# Patient Record
Sex: Female | Born: 1985 | Race: White | Hispanic: No | Smoking: Former smoker
Health system: Southern US, Community
[De-identification: ages and names within clinical notes are randomized; demographics above are authoritative.]

## PROBLEM LIST (undated history)

## (undated) DIAGNOSIS — D649 Anemia, unspecified: Secondary | ICD-10-CM

## (undated) DIAGNOSIS — J45909 Unspecified asthma, uncomplicated: Secondary | ICD-10-CM

## (undated) DIAGNOSIS — N920 Excessive and frequent menstruation with regular cycle: Secondary | ICD-10-CM

## (undated) DIAGNOSIS — F419 Anxiety disorder, unspecified: Secondary | ICD-10-CM

## (undated) DIAGNOSIS — F32A Depression, unspecified: Secondary | ICD-10-CM

## (undated) DIAGNOSIS — F329 Major depressive disorder, single episode, unspecified: Secondary | ICD-10-CM

## (undated) HISTORY — DX: Major depressive disorder, single episode, unspecified: F32.9

## (undated) HISTORY — DX: Unspecified asthma, uncomplicated: J45.909

## (undated) HISTORY — DX: Depression, unspecified: F32.A

## (undated) HISTORY — DX: Anxiety disorder, unspecified: F41.9

## (undated) HISTORY — PX: TUBAL LIGATION: SHX77

## (undated) HISTORY — DX: Excessive and frequent menstruation with regular cycle: N92.0

---

## 2007-03-04 ENCOUNTER — Emergency Department: Payer: Self-pay | Admitting: Emergency Medicine

## 2007-12-26 ENCOUNTER — Observation Stay: Payer: Self-pay

## 2008-02-13 ENCOUNTER — Observation Stay: Payer: Self-pay | Admitting: Obstetrics and Gynecology

## 2008-02-17 ENCOUNTER — Inpatient Hospital Stay: Payer: Self-pay

## 2009-05-07 ENCOUNTER — Observation Stay: Payer: Self-pay

## 2009-05-12 ENCOUNTER — Observation Stay: Payer: Self-pay | Admitting: Obstetrics and Gynecology

## 2009-05-23 ENCOUNTER — Observation Stay: Payer: Self-pay

## 2009-05-28 ENCOUNTER — Inpatient Hospital Stay: Payer: Self-pay | Admitting: Obstetrics and Gynecology

## 2010-01-03 ENCOUNTER — Emergency Department: Payer: Self-pay | Admitting: Emergency Medicine

## 2011-01-12 ENCOUNTER — Encounter: Payer: Self-pay | Admitting: Obstetrics and Gynecology

## 2011-03-09 ENCOUNTER — Encounter: Payer: Self-pay | Admitting: Obstetrics and Gynecology

## 2011-05-05 ENCOUNTER — Observation Stay: Payer: Self-pay | Admitting: Obstetrics and Gynecology

## 2011-05-17 ENCOUNTER — Observation Stay: Payer: Self-pay

## 2011-05-29 ENCOUNTER — Ambulatory Visit: Payer: Self-pay | Admitting: Internal Medicine

## 2011-06-15 ENCOUNTER — Inpatient Hospital Stay: Payer: Self-pay | Admitting: Obstetrics and Gynecology

## 2011-06-18 ENCOUNTER — Ambulatory Visit: Payer: Self-pay | Admitting: Internal Medicine

## 2011-07-25 ENCOUNTER — Ambulatory Visit: Payer: Self-pay | Admitting: Internal Medicine

## 2011-08-19 ENCOUNTER — Ambulatory Visit: Payer: Self-pay | Admitting: Internal Medicine

## 2012-02-21 ENCOUNTER — Emergency Department: Payer: Self-pay

## 2012-05-27 ENCOUNTER — Emergency Department: Payer: Self-pay | Admitting: Unknown Physician Specialty

## 2012-07-24 ENCOUNTER — Emergency Department: Payer: Self-pay | Admitting: Emergency Medicine

## 2012-09-19 ENCOUNTER — Emergency Department: Payer: Self-pay | Admitting: Emergency Medicine

## 2012-09-19 LAB — COMPREHENSIVE METABOLIC PANEL
Anion Gap: 8 (ref 7–16)
BUN: 11 mg/dL (ref 7–18)
Bilirubin,Total: 0.2 mg/dL (ref 0.2–1.0)
Chloride: 105 mmol/L (ref 98–107)
Co2: 29 mmol/L (ref 21–32)
Creatinine: 0.7 mg/dL (ref 0.60–1.30)
EGFR (African American): >60
EGFR (Non-African Amer.): >60
Potassium: 3.8 mmol/L (ref 3.5–5.1)
Total Protein: 8.1 g/dL (ref 6.4–8.2)

## 2012-09-19 LAB — CBC WITH DIFFERENTIAL/PLATELET
Basophil #: 0.1 10*3/uL (ref 0.0–0.1)
Eosinophil #: 0.1 10*3/uL (ref 0.0–0.7)
Eosinophil %: 1.5 %
HCT: 30.7 % — ABNORMAL LOW (ref 35.0–47.0)
Lymphocyte #: 2 10*3/uL (ref 1.0–3.6)
MCH: 21.7 pg — ABNORMAL LOW (ref 26.0–34.0)
MCV: 69 fL — ABNORMAL LOW (ref 80–100)
Monocyte #: 0.6 x10 3/mm (ref 0.2–0.9)
Neutrophil #: 5.7 10*3/uL (ref 1.4–6.5)
Neutrophil %: 67 %
Platelet: 224 10*3/uL (ref 150–440)
RBC: 4.45 10*6/uL (ref 3.80–5.20)
RDW: 17 % — ABNORMAL HIGH (ref 11.5–14.5)

## 2012-09-20 LAB — TROPONIN I: Troponin-I: <0.02 ng/mL

## 2013-03-27 ENCOUNTER — Emergency Department: Payer: Self-pay | Admitting: Internal Medicine

## 2013-06-11 DIAGNOSIS — G4489 Other headache syndrome: Secondary | ICD-10-CM | POA: Insufficient documentation

## 2013-09-01 ENCOUNTER — Ambulatory Visit: Payer: Self-pay | Admitting: Internal Medicine

## 2013-09-02 ENCOUNTER — Ambulatory Visit: Payer: Self-pay | Admitting: Internal Medicine

## 2013-09-17 ENCOUNTER — Ambulatory Visit: Payer: Self-pay | Admitting: Internal Medicine

## 2013-10-18 ENCOUNTER — Ambulatory Visit: Payer: Self-pay | Admitting: Internal Medicine

## 2013-10-23 DIAGNOSIS — L309 Dermatitis, unspecified: Secondary | ICD-10-CM | POA: Insufficient documentation

## 2013-10-27 LAB — CANCER CENTER HEMOGLOBIN: HGB: 12.5 g/dL (ref 12.0–16.0)

## 2013-11-17 ENCOUNTER — Ambulatory Visit: Payer: Self-pay | Admitting: Internal Medicine

## 2014-05-21 DIAGNOSIS — J309 Allergic rhinitis, unspecified: Secondary | ICD-10-CM | POA: Insufficient documentation

## 2014-10-28 ENCOUNTER — Emergency Department: Payer: Self-pay | Admitting: Emergency Medicine

## 2014-10-28 LAB — CBC
HCT: 42.6 % (ref 35.0–47.0)
HGB: 13.7 g/dL (ref 12.0–16.0)
MCH: 27.6 pg (ref 26.0–34.0)
MCHC: 32.3 g/dL (ref 32.0–36.0)
MCV: 86 fL (ref 80–100)
Platelet: 240 10*3/uL (ref 150–440)
RBC: 4.98 10*6/uL (ref 3.80–5.20)
RDW: 14.2 % (ref 11.5–14.5)
WBC: 10.1 10*3/uL (ref 3.6–11.0)

## 2014-10-28 LAB — BASIC METABOLIC PANEL
Anion Gap: 8 (ref 7–16)
BUN: 10 mg/dL (ref 7–18)
CALCIUM: 8.9 mg/dL (ref 8.5–10.1)
CHLORIDE: 104 mmol/L (ref 98–107)
CO2: 28 mmol/L (ref 21–32)
CREATININE: 0.67 mg/dL (ref 0.60–1.30)
EGFR (African American): >60
EGFR (Non-African Amer.): >60
Glucose: 88 mg/dL (ref 65–99)
Osmolality: 278 (ref 275–301)
POTASSIUM: 3.4 mmol/L — AB (ref 3.5–5.1)
SODIUM: 140 mmol/L (ref 136–145)

## 2014-10-28 LAB — TROPONIN I: Troponin-I: <0.02 ng/mL

## 2014-11-25 DIAGNOSIS — E669 Obesity, unspecified: Secondary | ICD-10-CM | POA: Insufficient documentation

## 2015-05-12 ENCOUNTER — Emergency Department
Admission: EM | Admit: 2015-05-12 | Discharge: 2015-05-12 | Disposition: A | Discharge status: Home / Self Care | Payer: Medicaid Other | Attending: Emergency Medicine | Admitting: Emergency Medicine

## 2015-05-12 ENCOUNTER — Encounter: Payer: Self-pay | Admitting: Emergency Medicine

## 2015-05-12 DIAGNOSIS — Z880 Allergy status to penicillin: Secondary | ICD-10-CM | POA: Insufficient documentation

## 2015-05-12 DIAGNOSIS — T63444A Toxic effect of venom of bees, undetermined, initial encounter: Secondary | ICD-10-CM | POA: Diagnosis not present

## 2015-05-12 DIAGNOSIS — Z87891 Personal history of nicotine dependence: Secondary | ICD-10-CM | POA: Diagnosis not present

## 2015-05-12 DIAGNOSIS — X58XXXA Exposure to other specified factors, initial encounter: Secondary | ICD-10-CM | POA: Insufficient documentation

## 2015-05-12 DIAGNOSIS — Y9289 Other specified places as the place of occurrence of the external cause: Secondary | ICD-10-CM | POA: Insufficient documentation

## 2015-05-12 DIAGNOSIS — Y9389 Activity, other specified: Secondary | ICD-10-CM | POA: Diagnosis not present

## 2015-05-12 DIAGNOSIS — S60561A Insect bite (nonvenomous) of right hand, initial encounter: Secondary | ICD-10-CM | POA: Diagnosis present

## 2015-05-12 DIAGNOSIS — Y998 Other external cause status: Secondary | ICD-10-CM | POA: Insufficient documentation

## 2015-05-12 MED ORDER — DIPHENHYDRAMINE HCL 50 MG/ML IJ SOLN
INTRAMUSCULAR | Status: AC
  Administered 2015-05-12: 25 mg via INTRAVENOUS
  Filled 2015-05-12: qty 1

## 2015-05-12 MED ORDER — DEXAMETHASONE SODIUM PHOSPHATE 10 MG/ML IJ SOLN
INTRAMUSCULAR | Status: AC
  Administered 2015-05-12: 10 mg via INTRAVENOUS
  Filled 2015-05-12: qty 1

## 2015-05-12 MED ORDER — DIPHENHYDRAMINE HCL 50 MG/ML IJ SOLN
25.0000 mg | Freq: Once | INTRAMUSCULAR | Status: AC
  Administered 2015-05-12: 25 mg via INTRAVENOUS

## 2015-05-12 MED ORDER — FAMOTIDINE IN NACL 20-0.9 MG/50ML-% IV SOLN
20.0000 mg | Freq: Once | INTRAVENOUS | Status: AC
  Administered 2015-05-12: 20 mg via INTRAVENOUS

## 2015-05-12 MED ORDER — SODIUM CHLORIDE 0.9 % IV BOLUS (SEPSIS)
1000.0000 mL | Freq: Once | INTRAVENOUS | Status: AC
  Administered 2015-05-12: 1000 mL via INTRAVENOUS

## 2015-05-12 MED ORDER — RANITIDINE HCL 150 MG PO TABS: 150.0000 mg | ORAL_TABLET | Freq: Two times a day (BID) | ORAL | Status: DC

## 2015-05-12 MED ORDER — PREDNISONE 20 MG PO TABS
40.0000 mg | ORAL_TABLET | Freq: Every day | ORAL | Status: AC
Start: 2015-05-12 — End: 2016-05-11

## 2015-05-12 MED ORDER — LORATADINE 10 MG PO TABS: 10.0000 mg | ORAL_TABLET | Freq: Every day | ORAL | Status: DC

## 2015-05-12 MED ORDER — FAMOTIDINE IN NACL 20-0.9 MG/50ML-% IV SOLN
INTRAVENOUS | Status: AC
  Administered 2015-05-12: 20 mg via INTRAVENOUS
  Filled 2015-05-12: qty 50

## 2015-05-12 MED ORDER — DEXAMETHASONE SODIUM PHOSPHATE 10 MG/ML IJ SOLN
10.0000 mg | Freq: Once | INTRAMUSCULAR | Status: AC
  Administered 2015-05-12: 10 mg via INTRAVENOUS

## 2015-05-12 NOTE — ED Provider Notes (Signed)
CSN: [id number]     Arrival date & time 05/12/15  1508 History   First MD Initiated Contact with Patient 05/12/15 1546     Chief Complaint  Patient presents with  . 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 No pertinent past medical history. Past Surgical History  Procedure Laterality Date  . Cesarean section     Family History  Problem Relation Age of Onset  . Heart failure Mother    History  Substance Use Topics  . Smoking status: Former Games developermoker  . Smokeless tobacco: Not on file  . Alcohol Use: No   OB History    No data available     Review of Systems  Constitutional: Negative.   HENT: Negative.   Eyes: Negative.   Respiratory: Negative.   Cardiovascular: Negative.   Gastrointestinal: Negative.   Musculoskeletal: Negative.   Skin: Positive for rash.  Neurological: Negative.   All other systems reviewed and are negative.     Allergies  Penicillins  Home Medications   Prior to Admission medications   Medication Sig Start Date End Date Taking? Authorizing Provider  loratadine (CLARITIN) 10 MG tablet Take 1 tablet (10 mg total) by mouth daily. 05/12/15 05/11/16  Giorgia Wahler William C Syrianna Schillaci, PA-C  predniSONE (DELTASONE) 20 MG tablet Take 2 tablets (40 mg total) by mouth daily. As needed for reaction 05/12/15 05/11/16  Quantay Zaremba William C Teigan Manner, PA-C  ranitidine (ZANTAC) 150 MG tablet Take 1 tablet (150 mg total) by mouth 2 (two) times daily. 05/12/15 05/11/16   Guiseppe Flanagan William C Shivangi Lutz, PA-C   BP 132/91 mmHg  Pulse 94  Temp(Src) 98.7 F (37.1 C) (Oral)  Resp 18  Ht 5\' 4"  (1.626 m)  Wt 253 lb (114.76 kg)  BMI 43.41 kg/m2  SpO2 99% Physical Exam Female appearing stated age well-developed well-nourished no acute distress Vitals were reviewed Head ears eyes nose neck and throat examination were unremarkable pupils equal round reactive light accommodation extraocular motions intact airway is open no pharyngeal swelling tongue swelling lip swelling no difficulty controlling her secretions Cardiovascular regular rate and rhythm no murmurs rubs gallops Pulmonary lungs clear to auscultation bilaterally Neuro exam nonfocal cranial nerves II through XII grossly intact Psychologically patient is very pleasant acting appropriately . Musculoskeletal she has swelling to her right hand with difficulty making a fist due to swelling in hand that she has good cap refill good distal pulses good sensation strength throughout Skin is mildly erythematous on the palmar aspect of the right hand  ED Course  Procedures (  MDM  Patient with history of allergic reactions to bee stings was stung in the right hand by me after 2 hours in the department given IV Benadryl steroid-induced and Pepcid and ice pack applied to her hand swelling is greatly reduced she is having no difficulty with breathing no chest pain shortness of breath swelling in her throat or tongue or lips and hands swelling is almost completely gone discharging her home recommended and a histamines keeping her  hand elevated ice to the area she has epi-pens at home return here for any acute concerns or worsening symptoms Final diagnoses:  Bee sting reaction, undetermined intent, initial encounter        Kaidyn Javid Rosalyn Gess, PA-C 05/12/15 1720  Myrna Blazer, MD 05/12/15 [phone]

## 2015-05-12 NOTE — Discharge Instructions (Signed)
Angioedema °Angioedema is a sudden swelling of tissues, often of the skin. It can occur on the face or genitals or in the abdomen or other body parts. The swelling usually develops over a short period and gets better in 24 to 48 hours. It often begins during the night and is found when the person wakes up. The person may also get red, itchy patches of skin (hives). Angioedema can be dangerous if it involves swelling of the air passages.  °Depending on the cause, episodes of angioedema may only happen once, come back in unpredictable patterns, or repeat for several years and then gradually fade away.  °CAUSES  °Angioedema can be caused by an allergic reaction to various triggers. It can also result from nonallergic causes, including reactions to drugs, immune system disorders, viral infections, or an abnormal gene that is passed to you from your parents (hereditary). For some people with angioedema, the cause is unknown.  °Some things that can trigger angioedema include:  °· Foods.   °· Medicines, such as ACE inhibitors, ARBs, nonsteroidal anti-inflammatory agents, or estrogen.   °· Latex.   °· Animal saliva.   °· Insect stings.   °· Dyes used in X-rays.   °· Mild injury.   °· Dental work. °· Surgery. °· Stress.   °· Sudden changes in temperature.   °· Exercise. °SIGNS AND SYMPTOMS  °· Swelling of the skin. °· Hives. If these are present, there is also intense itching. °· Redness in the affected area.   °· Pain in the affected area. °· Swollen lips or tongue. °· Breathing problems. This may happen if the air passages swell. °· Wheezing. °If internal organs are involved, there may be:  °· Nausea.   °· Abdominal pain.   °· Vomiting.   °· Difficulty swallowing.   °· Difficulty passing urine. °DIAGNOSIS  °· Your health care provider will examine the affected area and take a medical and family history. °· Various tests may be done to help determine the cause. Tests may include: °¨ Allergy skin tests to see if the problem  is an allergic reaction.   °¨ Blood tests to check for hereditary angioedema.   °¨ Tests to check for underlying diseases that could cause the condition.   °· A review of your medicines, including over-the-counter medicines, may be done. °TREATMENT  °Treatment will depend on the cause of the angioedema. Possible treatments include:  °· Removal of anything that triggered the condition (such as stopping certain medicines).   °· Medicines to treat symptoms or prevent attacks. Medicines given may include:   °¨ Antihistamines.   °¨ Epinephrine injection.   °¨ Steroids.   °· Hospitalization may be required for severe attacks. If the air passages are affected, it can be an emergency. Tubes may need to be placed to keep the airway open. °HOME CARE INSTRUCTIONS  °· Take all medicines as directed by your health care provider. °· If you were given medicines for emergency allergy treatment, always carry them with you. °· Wear a medical bracelet as directed by your health care provider.   °· Avoid known triggers. °SEEK MEDICAL CARE IF:  °· You have repeat attacks of angioedema.   °· Your attacks are more frequent or more severe despite preventive measures.   °· You have hereditary angioedema and are considering having children. It is important to discuss with your health care provider the risks of passing the condition on to your children. °SEEK IMMEDIATE MEDICAL CARE IF:  °· You have severe swelling of the mouth, tongue, or lips. °· You have difficulty breathing.   °· You have difficulty swallowing.   °· You faint. °MAKE   SURE YOU: °· Understand these instructions. °· Will watch your condition. °· Will get help right away if you are not doing well or get worse. °Document Released: 02/12/2002 Document Revised: 04/20/2014 Document Reviewed: 07/28/2013 °[employer]® Patient Information ©2015 [employer]. This information is not intended to replace advice given to you by your health care provider. Make sure you discuss any questions  you have with your health care provider. ° °

## 2015-05-12 NOTE — ED Notes (Signed)
Pt was stung by bee today.  Has epi pen, has not used it.  Right hand was stung; swelling present.  Denies trouble breathing or swelling/itching to throat.  No respiratory distress in triage.

## 2015-05-12 NOTE — ED Notes (Signed)
Pt arrives with bee sting to right hand, pt states she is allergic to bees and did not have her epi pen, pt has no problems breathing at this time, alert and oriented in no respiratory distress

## 2015-05-12 NOTE — ED Notes (Signed)
Ice pack applied to right hand.

## 2015-07-20 ENCOUNTER — Encounter: Payer: Self-pay | Admitting: *Deleted

## 2015-07-20 ENCOUNTER — Emergency Department: Payer: Medicaid Other

## 2015-07-20 ENCOUNTER — Emergency Department
Admission: EM | Admit: 2015-07-20 | Discharge: 2015-07-20 | Disposition: A | Discharge status: Home / Self Care | Payer: Medicaid Other | Attending: Emergency Medicine | Admitting: Emergency Medicine

## 2015-07-20 DIAGNOSIS — Y92512 Supermarket, store or market as the place of occurrence of the external cause: Secondary | ICD-10-CM | POA: Diagnosis not present

## 2015-07-20 DIAGNOSIS — Y9389 Activity, other specified: Secondary | ICD-10-CM | POA: Diagnosis not present

## 2015-07-20 DIAGNOSIS — Y998 Other external cause status: Secondary | ICD-10-CM | POA: Diagnosis not present

## 2015-07-20 DIAGNOSIS — Z880 Allergy status to penicillin: Secondary | ICD-10-CM | POA: Diagnosis not present

## 2015-07-20 DIAGNOSIS — S63502A Unspecified sprain of left wrist, initial encounter: Secondary | ICD-10-CM | POA: Diagnosis not present

## 2015-07-20 DIAGNOSIS — X58XXXA Exposure to other specified factors, initial encounter: Secondary | ICD-10-CM | POA: Diagnosis not present

## 2015-07-20 DIAGNOSIS — S6992XA Unspecified injury of left wrist, hand and finger(s), initial encounter: Secondary | ICD-10-CM | POA: Diagnosis present

## 2015-07-20 DIAGNOSIS — Z87891 Personal history of nicotine dependence: Secondary | ICD-10-CM | POA: Diagnosis not present

## 2015-07-20 DIAGNOSIS — Z79899 Other long term (current) drug therapy: Secondary | ICD-10-CM | POA: Diagnosis not present

## 2015-07-20 MED ORDER — KETOROLAC TROMETHAMINE 10 MG PO TABS
10.0000 mg | ORAL_TABLET | Freq: Once | ORAL | Status: AC
  Administered 2015-07-20: 10 mg via ORAL
  Filled 2015-07-20: qty 1

## 2015-07-20 MED ORDER — KETOROLAC TROMETHAMINE 10 MG PO TABS: 10.0000 mg | ORAL_TABLET | Freq: Three times a day (TID) | ORAL | Status: DC | PRN

## 2015-07-20 MED ORDER — OXYCODONE-ACETAMINOPHEN 5-325 MG PO TABS
1.0000 | ORAL_TABLET | Freq: Once | ORAL | Status: AC
Start: 2015-07-20 — End: 2015-07-20
  Administered 2015-07-20: 1 via ORAL
  Filled 2015-07-20: qty 1

## 2015-07-20 NOTE — ED Notes (Signed)
Pt states she was picking her child up out of the buggy and felt a twist in her left wrist. C/o left wrist pain and swelling. CMS intact.

## 2015-07-20 NOTE — Discharge Instructions (Signed)
Wrist Splint A wrist splint holds your wrist in a set position so that it does not move (fixed position). It can help broken bones and sprains heal faster, with less pain. It can also help relieve pressure on the nerve that runs down the middle of your arm (median nerve) into your fingers.  HOME CARE  Wear your splint as told by your doctor. It may be worn while you sleep.  Exercise your wrist as told by your doctor. These exercises help keep muscle strength in your hand and wrist. They also help to make sure you keep motion in your fingers. GET HELP RIGHT AWAY IF:   You start to lose feeling in your hand or fingers.  Your skin or fingernails turn blue or gray, or they feel cold. MAKE SURE YOU:   Understand these instructions.  Will watch your condition.  Will get help right away if you are not doing well or get worse. Document Released: 05/22/2008 Document Revised: 02/26/2012 Document Reviewed: 03/17/2014 [facility] Patient Information 2015 [place], [place]. This information is not intended to replace advice given to you by your health care provider. Make sure you discuss any questions you have with your health care provider.

## 2015-07-20 NOTE — ED Provider Notes (Signed)
[facility] Emergency Department Provider Note  ____________________________________________  Time seen: 8:15 PM  I have reviewed the triage vital signs and the nursing notes.   HISTORY  Chief Complaint Wrist Pain     HPI Cathy Trevino is a 29 y.o. female presents with left wrist pain currently 7 out of 10 and nonradiating. Patient states pain started while she was picking up her child out of the shopping cart at [place]. Patient felt a twist in her wrists with resultant pain and swelling.    Past medical history None There are no active problems to display for this patient.   Past Surgical History  Procedure Laterality Date  . Cesarean section      Current Outpatient Rx  Name  Route  Sig  Dispense  Refill  . ketorolac (TORADOL) 10 MG tablet   Oral   Take 1 tablet (10 mg total) by mouth every 8 (eight) hours as needed.   20 tablet   0   . loratadine (CLARITIN) 10 MG tablet   Oral   Take 1 tablet (10 mg total) by mouth daily.   30 tablet   2   . predniSONE (DELTASONE) 20 MG tablet   Oral   Take 2 tablets (40 mg total) by mouth daily. As needed for reaction   10 tablet   0   . ranitidine (ZANTAC) 150 MG tablet   Oral   Take 1 tablet (150 mg total) by mouth 2 (two) times daily.   14 tablet   0     Allergies Penicillins  Family History  Problem Relation Age of Onset  . Heart failure Mother     Social History History  Substance Use Topics  . Smoking status: Former Games developer  . Smokeless tobacco: Not on file  . Alcohol Use: No    Review of Systems  Constitutional: Negative for fever. Eyes: Negative for visual changes. ENT: Negative for sore throat. Cardiovascular: Negative for chest pain. Respiratory: Negative for shortness of breath. Gastrointestinal: Negative for abdominal pain, vomiting and diarrhea. Genitourinary: Negative for dysuria. Musculoskeletal: Negative for back pain. Positive left wrist pain Skin:  Negative for rash. Neurological: Negative for headaches, focal weakness or numbness.   10-point ROS otherwise negative.  ____________________________________________   PHYSICAL EXAM:  VITAL SIGNS: ED Triage Vitals  Enc Vitals Group     BP 07/20/15 1846 132/71 mmHg     Pulse Rate 07/20/15 1846 95     Resp 07/20/15 1846 18     Temp 07/20/15 1846 98.2 F (36.8 C)     Temp Source 07/20/15 1846 Oral     SpO2 07/20/15 1846 95 %     Weight 07/20/15 1846 251 lb (113.853 kg)     Height 07/20/15 1846 5\' 4"  (1.626 m)     Head Cir --      Peak Flow --      Pain Score 07/20/15 1847 6     Pain Loc --      Pain Edu? --      Excl. in GC? --     Constitutional: Alert and oriented. Well appearing and in no distress. Eyes: Conjunctivae are normal. PERRL. Normal extraocular movements. ENT   Head: Normocephalic and atraumatic.   Nose: No congestion/rhinnorhea.   Mouth/Throat: Mucous membranes are moist.   Neck: No stridor. Cardiovascular: Normal rate, regular rhythm. Normal and symmetric distal pulses are present in all extremities. No murmurs, rubs, or gallops. Respiratory: Normal respiratory effort without tachypnea  nor retractions. Breath sounds are clear and equal bilaterally. No wheezes/rales/rhonchi. Gastrointestinal: Soft and nontender. No distention. There is no CVA tenderness. Genitourinary: deferred Musculoskeletal: Pain to palpation of the ulnar aspect of the left wrists that is nonradiating. No swelling appreciated. Neurologic:  Normal speech and language. No gross focal neurologic deficits are appreciated. Speech is normal.  Skin:  Skin is warm, dry and intact. No rash noted. Psychiatric: Mood and affect are normal. Speech and behavior are normal. Patient exhibits appropriate insight and judgment.    RADIOLOGY  Left wrist x-ray revealed:    DG Wrist Complete Left (Final result) Result time: 07/20/15 19:20:06   Final result by Rad Results In Interface  (07/20/15 19:20:06)   Narrative:   CLINICAL DATA: Twisting injury to left wrist while picking up child. Left wrist pain and swelling. Initial encounter.  EXAM: LEFT WRIST - COMPLETE 3+ VIEW  COMPARISON: None.  FINDINGS: There is no evidence of fracture or dislocation. The carpal rows are intact, and demonstrate normal alignment. The joint spaces are preserved.  No significant soft tissue abnormalities are seen.  IMPRESSION: No evidence of fracture or dislocation.   Electronically Signed By: Roanna Raider M.D. On: 07/20/2015 19:20             INITIAL IMPRESSION / ASSESSMENT AND PLAN / ED COURSE  Pertinent labs & imaging results that were available during my care of the patient were reviewed by me and considered in my medical decision making (see chart for details).  Left wrist Velcro splint applied patient received Toradol 10 mg tablets by mouth in the emergency department. His strip physical exam consistent with a left wrist sprain.  ____________________________________________   FINAL CLINICAL IMPRESSION(S) / ED DIAGNOSES  Final diagnoses:  Left wrist sprain, initial encounter      Darci Current, MD 07/20/15 2037

## 2015-08-06 ENCOUNTER — Encounter: Payer: Self-pay | Admitting: Emergency Medicine

## 2015-08-06 ENCOUNTER — Emergency Department
Admission: EM | Admit: 2015-08-06 | Discharge: 2015-08-06 | Disposition: A | Discharge status: Home / Self Care | Payer: Medicaid Other | Attending: Emergency Medicine | Admitting: Emergency Medicine

## 2015-08-06 ENCOUNTER — Emergency Department: Payer: Medicaid Other

## 2015-08-06 DIAGNOSIS — Z87891 Personal history of nicotine dependence: Secondary | ICD-10-CM | POA: Diagnosis not present

## 2015-08-06 DIAGNOSIS — Y998 Other external cause status: Secondary | ICD-10-CM | POA: Insufficient documentation

## 2015-08-06 DIAGNOSIS — Z880 Allergy status to penicillin: Secondary | ICD-10-CM | POA: Insufficient documentation

## 2015-08-06 DIAGNOSIS — Y9301 Activity, walking, marching and hiking: Secondary | ICD-10-CM | POA: Diagnosis not present

## 2015-08-06 DIAGNOSIS — S93402A Sprain of unspecified ligament of left ankle, initial encounter: Secondary | ICD-10-CM | POA: Insufficient documentation

## 2015-08-06 DIAGNOSIS — S99912A Unspecified injury of left ankle, initial encounter: Secondary | ICD-10-CM | POA: Diagnosis present

## 2015-08-06 DIAGNOSIS — Z79899 Other long term (current) drug therapy: Secondary | ICD-10-CM | POA: Insufficient documentation

## 2015-08-06 DIAGNOSIS — W1849XA Other slipping, tripping and stumbling without falling, initial encounter: Secondary | ICD-10-CM | POA: Insufficient documentation

## 2015-08-06 DIAGNOSIS — Y9289 Other specified places as the place of occurrence of the external cause: Secondary | ICD-10-CM | POA: Diagnosis not present

## 2015-08-06 MED ORDER — TRAMADOL HCL 50 MG PO TABS: 100.0000 mg | ORAL_TABLET | Freq: Once | ORAL | Status: DC

## 2015-08-06 MED ORDER — IBUPROFEN 800 MG PO TABS: 800.0000 mg | ORAL_TABLET | Freq: Three times a day (TID) | ORAL | Status: DC | PRN

## 2015-08-06 NOTE — ED Notes (Signed)
Obvious "indention" in pts left ankle is no longer present upon assessment. When pt asked if it had popped back in place, pt states "I don't know." Will follow up after xray. Xray tech called and made aware.

## 2015-08-06 NOTE — ED Provider Notes (Signed)
[facility] Emergency Department Provider Note ____________________________________________  Time seen: 1515  I have reviewed the triage vital signs and the nursing notes.  HISTORY  Chief Complaint  Dislocation  HPI Cathy Trevino is a 29 y.o. female reports to the ED with injury to her left ankle today, as she tripped walking up the stairs. She describes hearing a pop to the ankle, and reports it ED with primarily lateral ankle pain with swelling. She was under the impression she may have dislocated her ankle.   History reviewed. No pertinent past medical history.  There are no active problems to display for this patient.  Past Surgical History  Procedure Laterality Date  . Cesarean section      Current Outpatient Rx  Name  Route  Sig  Dispense  Refill  . ibuprofen (ADVIL,MOTRIN) 800 MG tablet   Oral   Take 1 tablet (800 mg total) by mouth every 8 (eight) hours as needed.   30 tablet   0   . loratadine (CLARITIN) 10 MG tablet   Oral   Take 1 tablet (10 mg total) by mouth daily.   30 tablet   2   . predniSONE (DELTASONE) 20 MG tablet   Oral   Take 2 tablets (40 mg total) by mouth daily. As needed for reaction   10 tablet   0   . ranitidine (ZANTAC) 150 MG tablet   Oral   Take 1 tablet (150 mg total) by mouth 2 (two) times daily.   14 tablet   0    Allergies Penicillins  Family History  Problem Relation Age of Onset  . Heart failure Mother     Social History Social History  Substance Use Topics  . Smoking status: Former Games developer  . Smokeless tobacco: None  . Alcohol Use: No   Review of Systems  Constitutional: Negative for fever. Eyes: Negative for visual changes. ENT: Negative for sore throat. Cardiovascular: Negative for chest pain. Respiratory: Negative for shortness of breath. Gastrointestinal: Negative for abdominal pain, vomiting and diarrhea. Genitourinary: Negative for dysuria. Musculoskeletal: Negative for back  pain. Left ankle pain Skin: Negative for rash. Neurological: Negative for headaches, focal weakness or numbness. ____________________________________________  PHYSICAL EXAM:  VITAL SIGNS: ED Triage Vitals  Enc Vitals Group     BP 08/06/15 1237 113/75 mmHg     Pulse Rate 08/06/15 1237 108     Resp 08/06/15 1237 18     Temp 08/06/15 1237 98.4 F (36.9 C)     Temp Source 08/06/15 1237 Oral     SpO2 08/06/15 1237 99 %     Weight --      Height --      Head Cir --      Peak Flow --      Pain Score 08/06/15 1311 10     Pain Loc --      Pain Edu? --      Excl. in GC? --    Constitutional: Alert and oriented. Well appearing and in no distress. Eyes: Conjunctivae are normal. PERRL. Normal extraocular movements. ENT   Head: Normocephalic and atraumatic.   Nose: No congestion/rhinnorhea.   Mouth/Throat: Mucous membranes are moist.   Neck: Supple. No thyromegaly. Hematological/Lymphatic/Immunilogical: No cervical lymphadenopathy. Cardiovascular: Normal distal pulses Respiratory: Normal respiratory effort. No wheezes/rales/rhonchi. Musculoskeletal: Nontender with normal range of motion in all extremities, except the left ankle. Obvious lateral swelling, tenderness, and early ecchymosis is noted. No joint deformity is appreciated on gross  exam. Normal ROM with negative drawer. No calf or achilles tenderness.  Neurologic:  Normal gait without ataxia. Normal speech and language. No gross focal neurologic deficits are appreciated. Skin:  Skin is warm, dry and intact. No rash noted. Psychiatric: Mood and affect are normal. Patient exhibits appropriate insight and judgment. ____________________________________________   RADIOLOGY Left Ankle  IMPRESSION: No acute abnormality. ____________________________________________  PROCEDURES  Splint Crutches ____________________________________________  INITIAL IMPRESSION / ASSESSMENT AND PLAN / ED COURSE  Grade II left ankle  sprain without dislocation. Splint and crutches for support. Ibuprofen 800 mg #30 for pain and inflammation. Follow-up with ortho for ongoing symptoms.  ____________________________________________  FINAL CLINICAL IMPRESSION(S) / ED DIAGNOSES  Final diagnoses:  Left ankle sprain, initial encounter     Lissa Hoard, PA-C 08/07/15 1939  Loleta Rose, MD 08/08/15 0002

## 2015-08-06 NOTE — ED Notes (Signed)
AAOx3.  Skin warm and dry.  NAD.  D/C home 

## 2015-08-06 NOTE — Discharge Instructions (Signed)
Ankle Sprain °An ankle sprain is an injury to the strong, fibrous tissues (ligaments) that hold the bones of your ankle joint together.  °CAUSES °An ankle sprain is usually caused by a fall or by twisting your ankle. Ankle sprains most commonly occur when you step on the outer edge of your foot, and your ankle turns inward. People who participate in sports are more prone to these types of injuries.  °SYMPTOMS  °· Pain in your ankle. The pain may be present at rest or only when you are trying to stand or walk. °· Swelling. °· Bruising. Bruising may develop immediately or within 1 to 2 days after your injury. °· Difficulty standing or walking, particularly when turning corners or changing directions. °DIAGNOSIS  °Your caregiver will ask you details about your injury and perform a physical exam of your ankle to determine if you have an ankle sprain. During the physical exam, your caregiver will press on and apply pressure to specific areas of your foot and ankle. Your caregiver will try to move your ankle in certain ways. An X-ray exam may be done to be sure a bone was not broken or a ligament did not separate from one of the bones in your ankle (avulsion fracture).  °TREATMENT  °Certain types of braces can help stabilize your ankle. Your caregiver can make a recommendation for this. Your caregiver may recommend the use of medicine for pain. If your sprain is severe, your caregiver may refer you to a surgeon who helps to restore function to parts of your skeletal system (orthopedist) or a physical therapist. °HOME CARE INSTRUCTIONS  °· Apply ice to your injury for 1-2 days or as directed by your caregiver. Applying ice helps to reduce inflammation and pain. °¨ Put ice in a plastic bag. °¨ Place a towel between your skin and the bag. °¨ Leave the ice on for 15-20 minutes at a time, every 2 hours while you are awake. °· Only take over-the-counter or prescription medicines for pain, discomfort, or fever as directed by  your caregiver. °· Elevate your injured ankle above the level of your heart as much as possible for 2-3 days. °· If your caregiver recommends crutches, use them as instructed. Gradually put weight on the affected ankle. Continue to use crutches or a cane until you can walk without feeling pain in your ankle. °· If you have a plaster splint, wear the splint as directed by your caregiver. Do not rest it on anything harder than a pillow for the first 24 hours. Do not put weight on it. Do not get it wet. You may take it off to take a shower or bath. °· You may have been given an elastic bandage to wear around your ankle to provide support. If the elastic bandage is too tight (you have numbness or tingling in your foot or your foot becomes cold and blue), adjust the bandage to make it comfortable. °· If you have an air splint, you may blow more air into it or let air out to make it more comfortable. You may take your splint off at night and before taking a shower or bath. Wiggle your toes in the splint several times per day to decrease swelling. °SEEK MEDICAL CARE IF:  °· You have rapidly increasing bruising or swelling. °· Your toes feel extremely cold or you lose feeling in your foot. °· Your pain is not relieved with medicine. °SEEK IMMEDIATE MEDICAL CARE IF: °· Your toes are numb or blue. °·   You have severe pain that is increasing. MAKE SURE YOU:   Understand these instructions.  Will watch your condition.  Will get help right away if you are not doing well or get worse. Document Released: 12/04/2005 Document Revised: 08/28/2012 Document Reviewed: 12/16/2011 [facility] Patient Information 2015 [place], [place]. This information is not intended to replace advice given to you by your health care provider. Make sure you discuss any questions you have with your health care provider.  Rest with the leg elevated.  Take the prescription ibuprofen as needed. Apply ice and use the crutches as needed. Follow-up with  your provider or Dr. Joice Lofts as needed.

## 2015-08-06 NOTE — ED Notes (Signed)
AAOx3.  Skin warm and dry.  NAD 

## 2015-08-06 NOTE — ED Notes (Signed)
Patient transported to X-ray 

## 2015-08-06 NOTE — ED Notes (Signed)
Pt states she tripped up the stairs today and injured her left ankle today, obvious dislocation or deformity to ankle, swelling noted. Pt states she heard a "pop."

## 2015-12-21 DIAGNOSIS — R079 Chest pain, unspecified: Secondary | ICD-10-CM | POA: Insufficient documentation

## 2016-02-29 ENCOUNTER — Ambulatory Visit: Payer: Medicaid Other | Admitting: Internal Medicine

## 2016-03-07 ENCOUNTER — Ambulatory Visit: Payer: Medicaid Other | Admitting: Internal Medicine

## 2016-03-10 ENCOUNTER — Other Ambulatory Visit: Payer: Self-pay | Admitting: Nurse Practitioner

## 2016-03-10 ENCOUNTER — Ambulatory Visit
Admission: RE | Admit: 2016-03-10 | Discharge: 2016-03-10 | Disposition: A | Discharge status: Home / Self Care | Payer: Medicaid Other | Source: Ambulatory Visit | Attending: Nurse Practitioner | Admitting: Nurse Practitioner

## 2016-03-10 DIAGNOSIS — M79602 Pain in left arm: Secondary | ICD-10-CM | POA: Diagnosis not present

## 2016-03-10 DIAGNOSIS — R238 Other skin changes: Secondary | ICD-10-CM | POA: Diagnosis present

## 2016-03-14 ENCOUNTER — Ambulatory Visit: Payer: Medicaid Other | Admitting: Internal Medicine

## 2016-03-15 ENCOUNTER — Emergency Department
Admission: EM | Admit: 2016-03-15 | Discharge: 2016-03-15 | Disposition: A | Discharge status: Home / Self Care | Payer: Medicaid Other | Attending: Emergency Medicine | Admitting: Emergency Medicine

## 2016-03-15 ENCOUNTER — Encounter: Payer: Self-pay | Admitting: Emergency Medicine

## 2016-03-15 DIAGNOSIS — Z791 Long term (current) use of non-steroidal anti-inflammatories (NSAID): Secondary | ICD-10-CM | POA: Diagnosis not present

## 2016-03-15 DIAGNOSIS — R109 Unspecified abdominal pain: Secondary | ICD-10-CM | POA: Diagnosis present

## 2016-03-15 DIAGNOSIS — Z79899 Other long term (current) drug therapy: Secondary | ICD-10-CM | POA: Diagnosis not present

## 2016-03-15 DIAGNOSIS — B029 Zoster without complications: Secondary | ICD-10-CM | POA: Insufficient documentation

## 2016-03-15 DIAGNOSIS — Z87891 Personal history of nicotine dependence: Secondary | ICD-10-CM | POA: Diagnosis not present

## 2016-03-15 DIAGNOSIS — Z7952 Long term (current) use of systemic steroids: Secondary | ICD-10-CM | POA: Diagnosis not present

## 2016-03-15 HISTORY — DX: Anemia, unspecified: D64.9

## 2016-03-15 LAB — URINALYSIS COMPLETE WITH MICROSCOPIC (ARMC ONLY)
Bacteria, UA: NONE SEEN
Bilirubin Urine: NEGATIVE
Glucose, UA: NEGATIVE mg/dL
Hgb urine dipstick: NEGATIVE
Ketones, ur: NEGATIVE mg/dL
Nitrite: NEGATIVE
PROTEIN: NEGATIVE mg/dL
Specific Gravity, Urine: 1.013 (ref 1.005–1.030)
pH: 7 (ref 5.0–8.0)

## 2016-03-15 LAB — COMPREHENSIVE METABOLIC PANEL
ALK PHOS: 68 U/L (ref 38–126)
ALT: 17 U/L (ref 14–54)
AST: 19 U/L (ref 15–41)
Albumin: 4.4 g/dL (ref 3.5–5.0)
Anion gap: 5 (ref 5–15)
BUN: 9 mg/dL (ref 6–20)
CO2: 27 mmol/L (ref 22–32)
Calcium: 9.1 mg/dL (ref 8.9–10.3)
Chloride: 105 mmol/L (ref 101–111)
Creatinine, Ser: 0.65 mg/dL (ref 0.44–1.00)
Glucose, Bld: 89 mg/dL (ref 65–99)
Potassium: 3.5 mmol/L (ref 3.5–5.1)
Sodium: 137 mmol/L (ref 135–145)
Total Bilirubin: 0.3 mg/dL (ref 0.3–1.2)
Total Protein: 8.4 g/dL — ABNORMAL HIGH (ref 6.5–8.1)

## 2016-03-15 LAB — CBC
HCT: 37.9 % (ref 35.0–47.0)
HEMOGLOBIN: 12 g/dL (ref 12.0–16.0)
MCH: 23.3 pg — AB (ref 26.0–34.0)
MCHC: 31.7 g/dL — ABNORMAL LOW (ref 32.0–36.0)
MCV: 73.4 fL — AB (ref 80.0–100.0)
Platelets: 216 10*3/uL (ref 150–440)
RBC: 5.16 MIL/uL (ref 3.80–5.20)
RDW: 21.5 % — ABNORMAL HIGH (ref 11.5–14.5)
WBC: 8.5 10*3/uL (ref 3.6–11.0)

## 2016-03-15 LAB — PREGNANCY, URINE: PREG TEST UR: NEGATIVE

## 2016-03-15 LAB — LIPASE, BLOOD: LIPASE: 16 U/L (ref 11–51)

## 2016-03-15 MED ORDER — ACYCLOVIR 400 MG PO TABS: 800.0000 mg | ORAL_TABLET | Freq: Every day | ORAL | Status: DC

## 2016-03-15 MED ORDER — OXYCODONE HCL 5 MG PO TABS: 5.0000 mg | ORAL_TABLET | Freq: Three times a day (TID) | ORAL | Status: AC | PRN

## 2016-03-15 NOTE — ED Provider Notes (Signed)
[facility] Emergency Department Provider Note   ____________________________________________  Time seen: ~1430  I have reviewed the triage vital signs and the nursing notes.   HISTORY  Chief Complaint Flank Pain   History limited by: Not Limited   HPI Cathy Trevino is a 30 y.o. female who presents to the emergency department today because of concerns for right flank pain. She states that the pain started 4 days ago. It is severe. It comes in waves. It is intense when it is worse. She describes sharp. Located under her right breast and around her right side. She denies any associated nausea or vomiting. Denies any change in urination or defecation. She has had a rash to that area she states for roughly the past 2 weeks. She denies any fevers.    Past Medical History  Diagnosis Date  . Anemia     There are no active problems to display for this patient.   Past Surgical History  Procedure Laterality Date  . Cesarean section      Current Outpatient Rx  Name  Route  Sig  Dispense  Refill  . ibuprofen (ADVIL,MOTRIN) 800 MG tablet   Oral   Take 1 tablet (800 mg total) by mouth every 8 (eight) hours as needed.   30 tablet   0   . loratadine (CLARITIN) 10 MG tablet   Oral   Take 1 tablet (10 mg total) by mouth daily.   30 tablet   2   . predniSONE (DELTASONE) 20 MG tablet   Oral   Take 2 tablets (40 mg total) by mouth daily. As needed for reaction   10 tablet   0   . ranitidine (ZANTAC) 150 MG tablet   Oral   Take 1 tablet (150 mg total) by mouth 2 (two) times daily.   14 tablet   0     Allergies Penicillins  Family History  Problem Relation Age of Onset  . Heart failure Mother     Social History Social History  Substance Use Topics  . Smoking status: Former Games developer  . Smokeless tobacco: None  . Alcohol Use: No    Review of Systems  Constitutional: Negative for fever. Cardiovascular: Negative for chest  pain. Respiratory: Negative for shortness of breath. Gastrointestinal: Positive for abdominal pain Genitourinary: Negative for dysuria. Musculoskeletal: Negative for back pain. Skin: Positive for rash. Neurological: Negative for headaches, focal weakness or numbness.   10-point ROS otherwise negative.  ____________________________________________   PHYSICAL EXAM:  VITAL SIGNS: ED Triage Vitals  Enc Vitals Group     BP 03/15/16 1346 126/61 mmHg     Pulse Rate 03/15/16 1346 88     Resp 03/15/16 1346 15     Temp 03/15/16 1346 97.4 F (36.3 C)     Temp Source 03/15/16 1346 Oral     SpO2 03/15/16 1346 100 %     Weight 03/15/16 1346 250 lb (113.399 kg)     Height 03/15/16 1346  (1.626 m)     Head Cir --      Peak Flow --      Pain Score 03/15/16 1409 9   Constitutional: Alert and oriented. Well appearing and in no distress. Eyes: Conjunctivae are normal. PERRL. Normal extraocular movements. ENT   Head: Normocephalic and atraumatic.   Nose: No congestion/rhinnorhea.   Mouth/Throat: Mucous membranes are moist.   Neck: No stridor. Hematological/Lymphatic/Immunilogical: No cervical lymphadenopathy. Cardiovascular: Normal rate, regular rhythm.  No murmurs, rubs, or  gallops. Respiratory: Normal respiratory effort without tachypnea nor retractions. Breath sounds are clear and equal bilaterally. No wheezes/rales/rhonchi. Gastrointestinal: Soft and slightly tender to palpation over the area of the rash Genitourinary: Deferred Musculoskeletal: Normal range of motion in all extremities. No joint effusions.  No lower extremity tenderness nor edema. Neurologic:  Normal speech and language. No gross focal neurologic deficits are appreciated.  Skin:  Skin is warm, dry and intact. Rash noted under right breast and around right flank. Does not cross midline. Consisting of very small vesicle type lesions. Psychiatric: Mood and affect are normal. Speech and behavior are normal.  Patient exhibits appropriate insight and judgment.  ____________________________________________    LABS (pertinent positives/negatives)  Labs Reviewed  COMPREHENSIVE METABOLIC PANEL - Abnormal; Notable for the following:    Total Protein 8.4 (*)    All other components within normal limits  CBC - Abnormal; Notable for the following:    MCV 73.4 (*)    MCH 23.3 (*)    MCHC 31.7 (*)    RDW 21.5 (*)    All other components within normal limits  URINALYSIS COMPLETEWITH MICROSCOPIC ([facility] ONLY) - Abnormal; Notable for the following:    Color, Urine YELLOW (*)    APPearance CLEAR (*)    Leukocytes, UA 3+ (*)    Squamous Epithelial / LPF 0-5 (*)    All other components within normal limits  LIPASE, BLOOD  PREGNANCY, URINE    ____________________________________________   EKG  None  ____________________________________________    RADIOLOGY  None  ____________________________________________   PROCEDURES  Procedure(s) performed: None  Critical Care performed: No  ____________________________________________   INITIAL IMPRESSION / ASSESSMENT AND PLAN / ED COURSE  Pertinent labs & imaging results that were available during my care of the patient were reviewed by me and considered in my medical decision making (see chart for details).  Patient presented to the emergency department today because of concern for pain under the right breast. On exam patient does have a vesicular rash that occupies a single dermatome. Given that this corresponds with the area of the pain I do think shingles is likely. Blood work was sent from triage which did not show any concerning findings. Certainly no leukocytosis or elevated liver enzymes or lipase to suggest concerning gallbladder disease at this point. Will discharge her with antivirals and pain medication.  ____________________________________________   FINAL CLINICAL IMPRESSION(S) / ED DIAGNOSES  Final diagnoses:  Shingles      Phineas SemenGraydon Isamar Nazir, MD 03/15/16 1505

## 2016-03-15 NOTE — ED Notes (Signed)
Pt reports right flank pain since Saturday, reports she went to PCP and was sent here to be evaluated for gallbladder. Pt denies N/V; denies any urinary sx.

## 2016-03-15 NOTE — Discharge Instructions (Signed)
Please seek medical attention for any high fevers, chest pain, shortness of breath, change in behavior, persistent vomiting, bloody stool or any other new or concerning symptoms.   Shingles Shingles, which is also known as herpes zoster, is an infection that causes a painful skin rash and fluid-filled blisters. Shingles is not related to genital herpes, which is a sexually transmitted infection.   Shingles only develops in people who:  Have had chickenpox.  Have received the chickenpox vaccine. (This is rare.) CAUSES Shingles is caused by varicella-zoster virus (VZV). This is the same virus that causes chickenpox. After exposure to VZV, the virus stays in the body in an inactive (dormant) state. Shingles develops if the virus reactivates. This can happen many years after the initial exposure to VZV. It is not known what causes this virus to reactivate. RISK FACTORS People who have had chickenpox or received the chickenpox vaccine are at risk for shingles. Infection is more common in people who:  Are older than age 32.  Have a weakened defense (immune) system, such as those with HIV, AIDS, or cancer.  Are taking medicines that weaken the immune system, such as transplant medicines.  Are under great stress. SYMPTOMS Early symptoms of this condition include itching, tingling, and pain in an area on your skin. Pain may be described as burning, stabbing, or throbbing. A few days or weeks after symptoms start, a painful red rash appears, usually on one side of the body in a bandlike or beltlike pattern. The rash eventually turns into fluid-filled blisters that break open, scab over, and dry up in about 2-3 weeks. At any time during the infection, you may also develop:  A fever.  Chills.  A headache.  An upset stomach. DIAGNOSIS This condition is diagnosed with a skin exam. Sometimes, skin or fluid samples are taken from the blisters before a diagnosis is made. These samples are examined  under a microscope or sent to a lab for testing. TREATMENT There is no specific cure for this condition. Your health care provider will probably prescribe medicines to help you manage pain, recover more quickly, and avoid long-term problems. Medicines may include:  Antiviral drugs.  Anti-inflammatory drugs.  Pain medicines. If the area involved is on your face, you may be referred to a specialist, such as an eye doctor (ophthalmologist) or an ear, nose, and throat (ENT) doctor to help you avoid eye problems, chronic pain, or disability. HOME CARE INSTRUCTIONS Medicines  Take medicines only as directed by your health care provider.  Apply an anti-itch or numbing cream to the affected area as directed by your health care provider. Blister and Rash Care  Take a cool bath or apply cool compresses to the area of the rash or blisters as directed by your health care provider. This may help with pain and itching.  Keep your rash covered with a loose bandage (dressing). Wear loose-fitting clothing to help ease the pain of material rubbing against the rash.  Keep your rash and blisters clean with mild soap and cool water or as directed by your health care provider.  Check your rash every day for signs of infection. These include redness, swelling, and pain that lasts or increases.  Do not pick your blisters.  Do not scratch your rash. General Instructions  Rest as directed by your health care provider.  Keep all follow-up visits as directed by your health care provider. This is important.  Until your blisters scab over, your infection can cause chickenpox in  people who have never had it or been vaccinated against it. To prevent this from happening, avoid contact with other people, especially:  Babies.  Pregnant women.  Children who have eczema.  Elderly people who have transplants.  People who have chronic illnesses, such as leukemia or AIDS. SEEK MEDICAL CARE IF:  Your pain is  not relieved with prescribed medicines.  Your pain does not get better after the rash heals.  Your rash looks infected. Signs of infection include redness, swelling, and pain that lasts or increases. SEEK IMMEDIATE MEDICAL CARE IF:  The rash is on your face or nose.  You have facial pain, pain around your eye area, or loss of feeling on one side of your face.  You have ear pain or you have ringing in your ear.  You have loss of taste.  Your condition gets worse.   This information is not intended to replace advice given to you by your health care provider. Make sure you discuss any questions you have with your health care provider.   Document Released: 12/04/2005 Document Revised: 12/25/2014 Document Reviewed: 10/15/2014 Elsevier Interactive Patient Education [employer]2016 Elsevier Inc.

## 2016-03-15 NOTE — ED Notes (Signed)
AAOx3.  Skin warm and dry.  NAD 

## 2016-03-15 NOTE — ED Notes (Signed)
Denies N/V/D

## 2016-03-17 ENCOUNTER — Ambulatory Visit
Admission: RE | Admit: 2016-03-17 | Discharge: 2016-03-17 | Disposition: A | Discharge status: Home / Self Care | Payer: Medicaid Other | Source: Ambulatory Visit | Attending: Nurse Practitioner | Admitting: Nurse Practitioner

## 2016-03-17 ENCOUNTER — Other Ambulatory Visit: Payer: Self-pay | Admitting: Nurse Practitioner

## 2016-03-17 DIAGNOSIS — R109 Unspecified abdominal pain: Secondary | ICD-10-CM | POA: Diagnosis not present

## 2016-03-17 DIAGNOSIS — R112 Nausea with vomiting, unspecified: Secondary | ICD-10-CM | POA: Diagnosis not present

## 2016-03-17 MED ORDER — IOPAMIDOL (ISOVUE-300) INJECTION 61%
100.0000 mL | Freq: Once | INTRAVENOUS | Status: AC | PRN
  Administered 2016-03-17: 100 mL via INTRAVENOUS

## 2017-04-14 IMAGING — US US EXTREM  UP VENOUS*L*
1 series · 13 of 24 positions shown · non-contrast
Comparison: None.

CLINICAL DATA: Acute left arm pain and discoloration.



[Series 1: us extrem up venous*left* · 0.07mm/px · 13 of 36 slices shown]
[im 1/36]
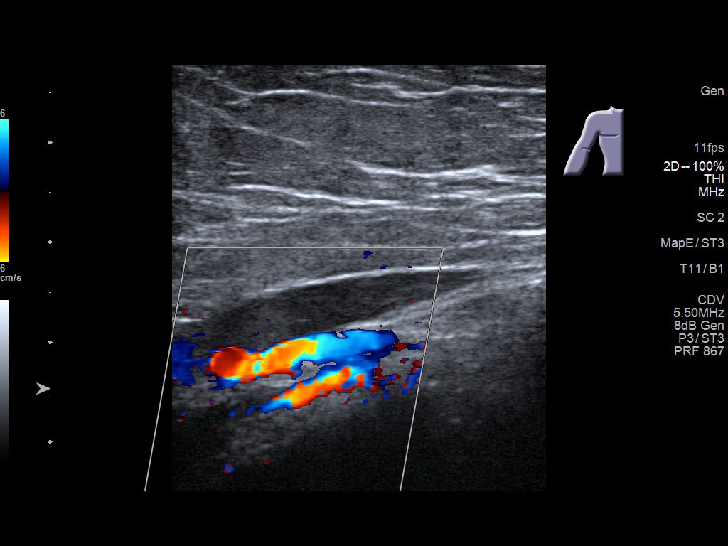
[im 4/36]
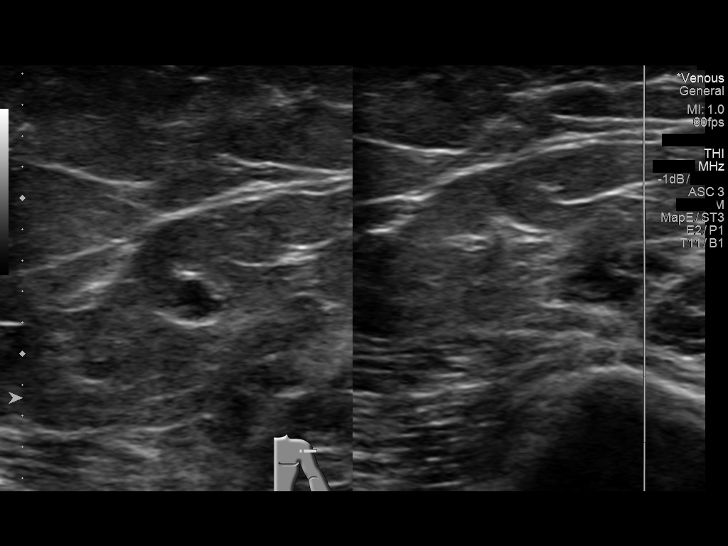
[im 7/36]
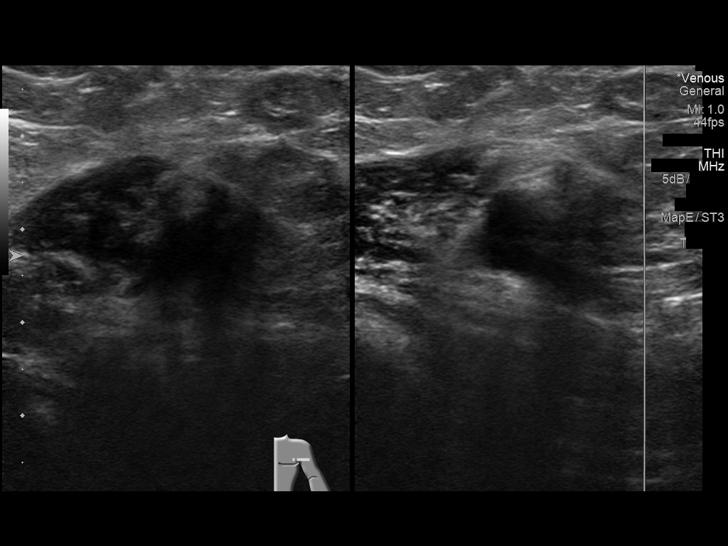
[im 10/36]
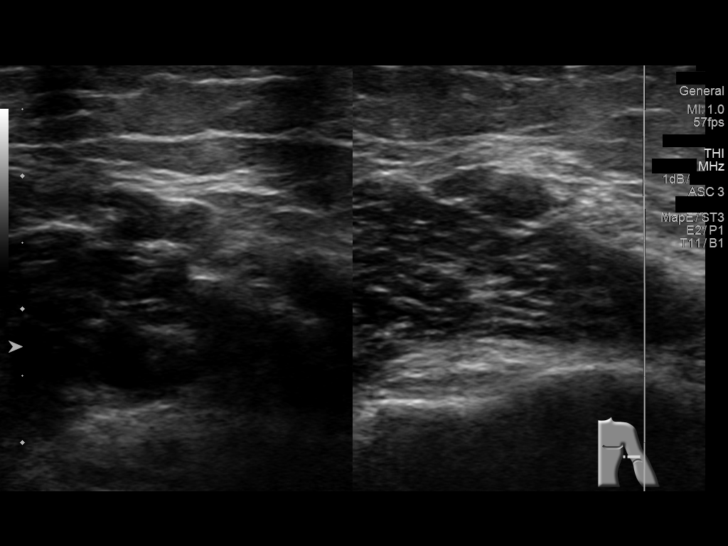
[im 13/36]
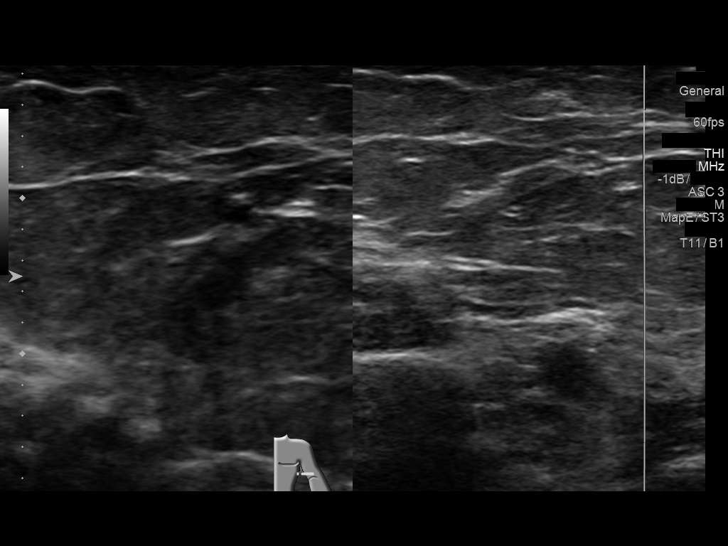
[im 16/36]
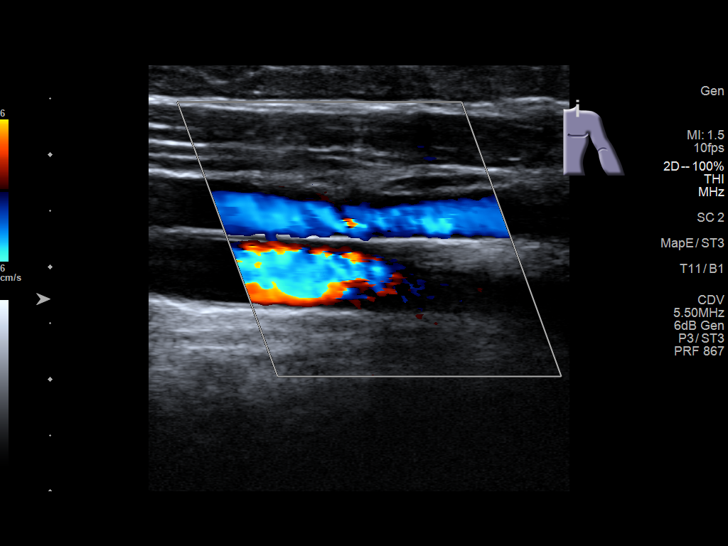
[im 19/36]
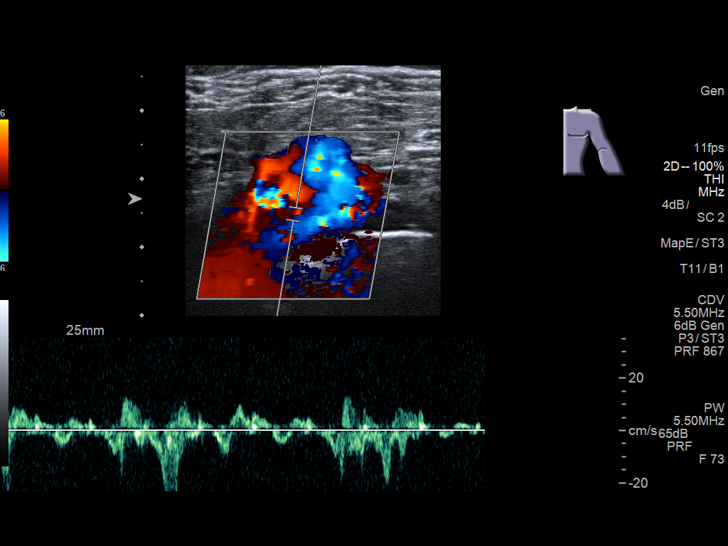
[im 20/36]
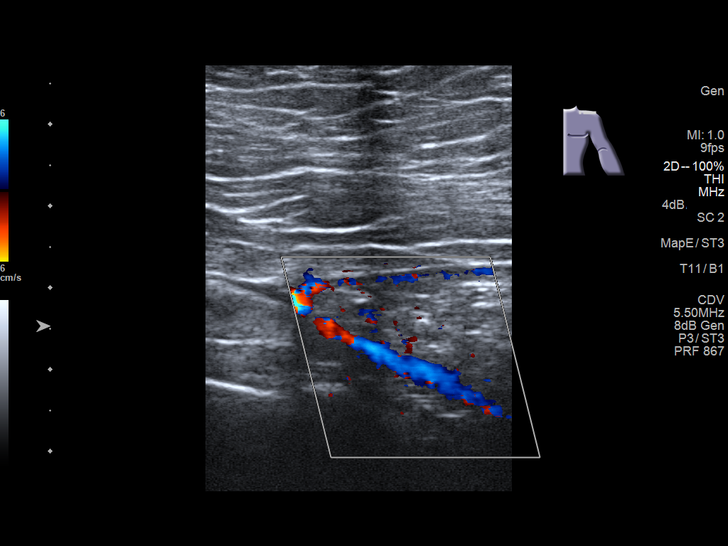
[im 23/36]
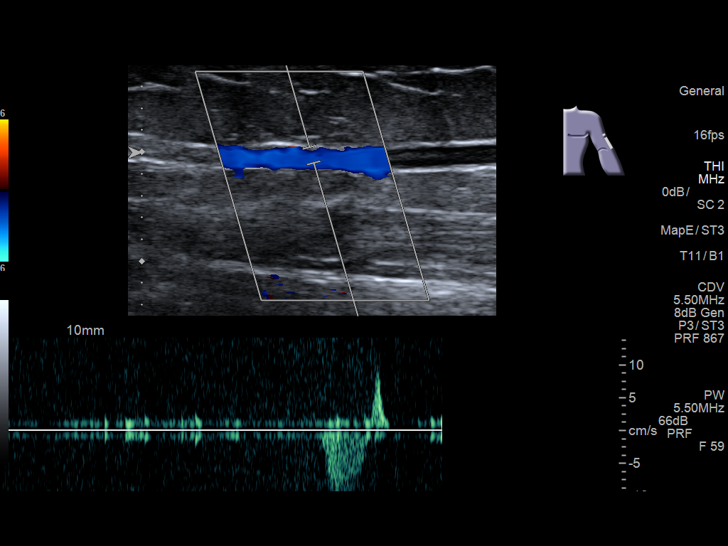
[im 26/36]
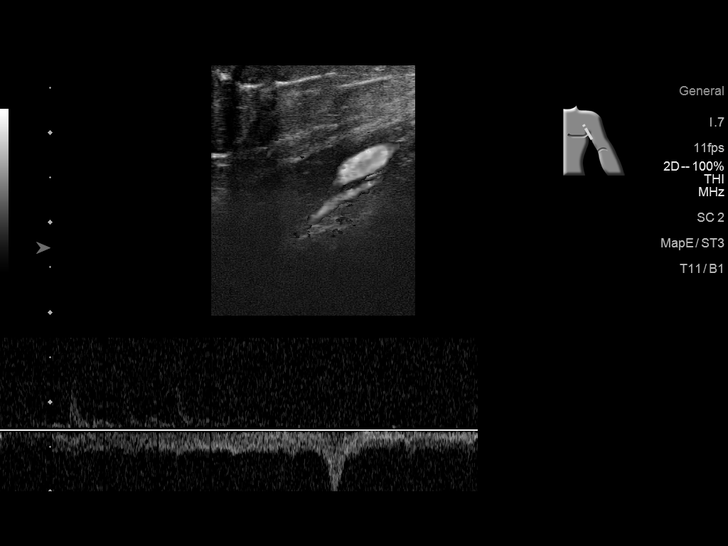
[im 29/36]
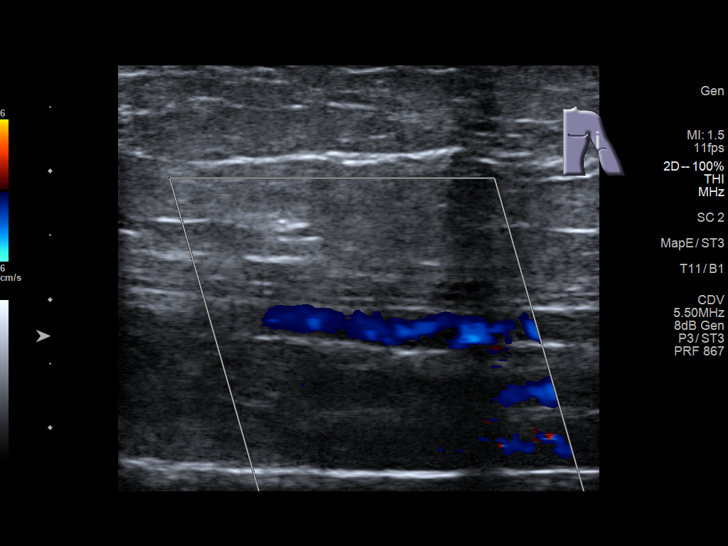
[im 32/36]
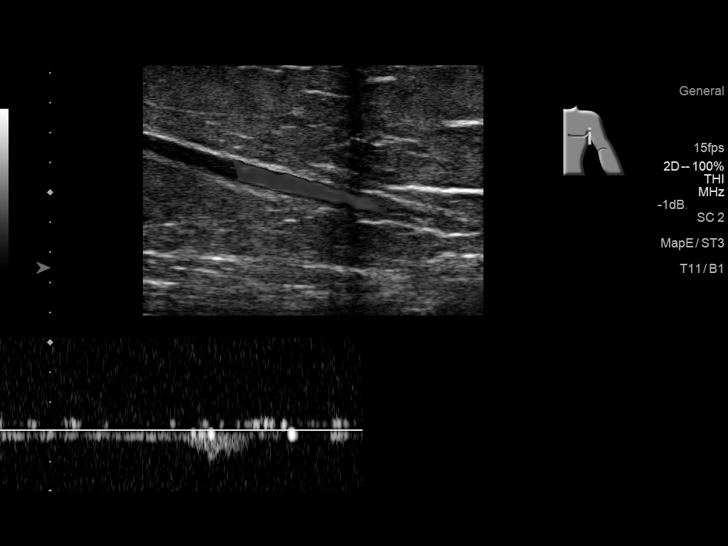
[im 36/36]
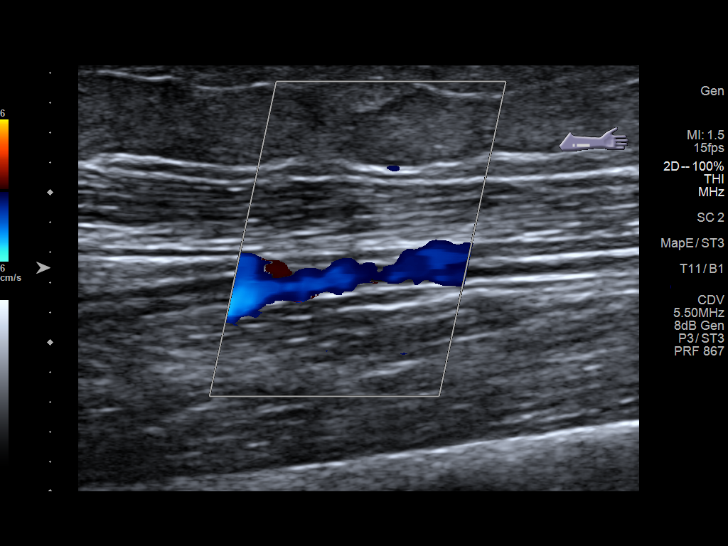

[13 of 24 positions shown; findings below may reference images not displayed]

FINDINGS: Contralateral Subclavian Vein: Respiratory phasicity is normal and
symmetric with the symptomatic side. No evidence of thrombus. Normal
compressibility.

Internal Jugular Vein: No evidence of thrombus. Normal
compressibility, respiratory phasicity and response to augmentation.

Subclavian Vein: No evidence of thrombus. Normal compressibility,
respiratory phasicity and response to augmentation.

Axillary Vein: No evidence of thrombus. Normal compressibility,
respiratory phasicity and response to augmentation.

Cephalic Vein: No evidence of thrombus. Normal compressibility,
respiratory phasicity and response to augmentation.

Basilic Vein: No evidence of thrombus. Normal compressibility,
respiratory phasicity and response to augmentation.

Brachial Veins: No evidence of thrombus. Normal compressibility,
respiratory phasicity and response to augmentation.

Radial Veins: No evidence of thrombus. Normal compressibility,
respiratory phasicity and response to augmentation.

Ulnar Veins: No evidence of thrombus. Normal compressibility,
respiratory phasicity and response to augmentation.

Venous Reflux:  None visualized.

Other Findings:  None visualized.
IMPRESSION: No evidence of deep venous thrombosis.

## 2017-11-27 ENCOUNTER — Emergency Department
Admission: EM | Admit: 2017-11-27 | Discharge: 2017-11-27 | Disposition: A | Discharge status: Home / Self Care | Payer: Medicaid Other | Attending: Emergency Medicine | Admitting: Emergency Medicine

## 2017-11-27 ENCOUNTER — Emergency Department: Payer: Medicaid Other

## 2017-11-27 ENCOUNTER — Encounter: Payer: Self-pay | Admitting: Emergency Medicine

## 2017-11-27 DIAGNOSIS — Z79899 Other long term (current) drug therapy: Secondary | ICD-10-CM | POA: Insufficient documentation

## 2017-11-27 DIAGNOSIS — R109 Unspecified abdominal pain: Secondary | ICD-10-CM | POA: Insufficient documentation

## 2017-11-27 DIAGNOSIS — Z87891 Personal history of nicotine dependence: Secondary | ICD-10-CM | POA: Insufficient documentation

## 2017-11-27 DIAGNOSIS — N390 Urinary tract infection, site not specified: Secondary | ICD-10-CM | POA: Insufficient documentation

## 2017-11-27 LAB — COMPREHENSIVE METABOLIC PANEL
ALT: 12 U/L — AB (ref 14–54)
AST: 17 U/L (ref 15–41)
Albumin: 3.8 g/dL (ref 3.5–5.0)
Alkaline Phosphatase: 61 U/L (ref 38–126)
Anion gap: 9 (ref 5–15)
BILIRUBIN TOTAL: 0.5 mg/dL (ref 0.3–1.2)
BUN: 8 mg/dL (ref 6–20)
CHLORIDE: 103 mmol/L (ref 101–111)
CO2: 26 mmol/L (ref 22–32)
CREATININE: 0.62 mg/dL (ref 0.44–1.00)
Calcium: 9.1 mg/dL (ref 8.9–10.3)
GFR calc Af Amer: >60 mL/min (ref >60)
Glucose, Bld: 106 mg/dL — ABNORMAL HIGH (ref 65–99)
POTASSIUM: 3.6 mmol/L (ref 3.5–5.1)
Sodium: 138 mmol/L (ref 135–145)
TOTAL PROTEIN: 8.1 g/dL (ref 6.5–8.1)

## 2017-11-27 LAB — CBC
HCT: 34.2 % — ABNORMAL LOW (ref 35.0–47.0)
Hemoglobin: 10.7 g/dL — ABNORMAL LOW (ref 12.0–16.0)
MCH: 23 pg — ABNORMAL LOW (ref 26.0–34.0)
MCHC: 31.4 g/dL — ABNORMAL LOW (ref 32.0–36.0)
MCV: 73.4 fL — AB (ref 80.0–100.0)
PLATELETS: 271 10*3/uL (ref 150–440)
RBC: 4.67 MIL/uL (ref 3.80–5.20)
RDW: 16.4 % — AB (ref 11.5–14.5)
WBC: 8.7 10*3/uL (ref 3.6–11.0)

## 2017-11-27 LAB — URINALYSIS, COMPLETE (UACMP) WITH MICROSCOPIC
BILIRUBIN URINE: NEGATIVE
Bacteria, UA: NONE SEEN
GLUCOSE, UA: NEGATIVE mg/dL
KETONES UR: NEGATIVE mg/dL
NITRITE: NEGATIVE
PH: 6 (ref 5.0–8.0)
PROTEIN: 100 mg/dL — AB
Specific Gravity, Urine: 1.01 (ref 1.005–1.030)

## 2017-11-27 LAB — POCT PREGNANCY, URINE: Preg Test, Ur: NEGATIVE

## 2017-11-27 LAB — LIPASE, BLOOD: LIPASE: 23 U/L (ref 11–51)

## 2017-11-27 MED ORDER — NAPROXEN 375 MG PO TABS: 375.0000 mg | ORAL_TABLET | Freq: Two times a day (BID) | ORAL | 0 refills | Status: DC

## 2017-11-27 MED ORDER — KETOROLAC TROMETHAMINE 30 MG/ML IJ SOLN
INTRAMUSCULAR | Status: AC
  Filled 2017-11-27: qty 1

## 2017-11-27 MED ORDER — KETOROLAC TROMETHAMINE 30 MG/ML IJ SOLN
15.0000 mg | Freq: Once | INTRAMUSCULAR | Status: AC
  Administered 2017-11-27: 15 mg via INTRAVENOUS

## 2017-11-27 MED ORDER — ONDANSETRON HCL 4 MG PO TABS: 4.0000 mg | ORAL_TABLET | Freq: Three times a day (TID) | ORAL | 0 refills | Status: DC | PRN

## 2017-11-27 MED ORDER — CEPHALEXIN 500 MG PO CAPS: 500.0000 mg | ORAL_CAPSULE | Freq: Three times a day (TID) | ORAL | 0 refills | Status: AC

## 2017-11-27 MED ORDER — FENTANYL CITRATE (PF) 100 MCG/2ML IJ SOLN
50.0000 ug | INTRAMUSCULAR | Status: AC | PRN
  Administered 2017-11-27 (×2): 50 ug via INTRAVENOUS
  Filled 2017-11-27 (×2): qty 2

## 2017-11-27 NOTE — ED Provider Notes (Addendum)
[facility] Emergency Department Provider Note  ____________________________________________   I have reviewed the triage vital signs and the nursing notes. Where available I have reviewed prior notes and, if possible and indicated, outside hospital notes.    HISTORY  Chief Complaint Abdominal Pain    HPI Cathy Trevino is a 31 y.o. female who presents today complaining of left-sided abdominal pain.  Has been there since the last few days.  Comes and goes.  Had it a few months ago as well.  Was told that it was muscle spasm.  Denies any fever chills nausea or vomiting.  Pain is sharp, nonradiating, denies any other symptoms such as urinary no fever no chills no nausea no vomiting no chest pain or shortness of breath no diarrhea no melena no bright red blood per rectum no pleuritic pain, no urinary discomfort, sharp cramping recurrent left-sided abdominal discomfort with no change in her stooling.  Nothing makes it better nothing makes it worse and it has been there for several days now.   Past Medical History:  Diagnosis Date  . Anemia     There are no active problems to display for this patient.   Past Surgical History:  Procedure Laterality Date  . CESAREAN SECTION      Prior to Admission medications   Medication Sig Start Date End Date Taking? Authorizing Provider  acyclovir (ZOVIRAX) 400 MG tablet Take 2 tablets (800 mg total) by mouth 5 (five) times daily. 03/15/16   Phineas Semen, MD  ibuprofen (ADVIL,MOTRIN) 800 MG tablet Take 1 tablet (800 mg total) by mouth every 8 (eight) hours as needed. 08/06/15   Menshew, Charlesetta Ivory, PA-C  loratadine (CLARITIN) 10 MG tablet Take 1 tablet (10 mg total) by mouth daily. 05/12/15 05/11/16  Garrel Ridgel, PA-C  ranitidine (ZANTAC) 150 MG tablet Take 1 tablet (150 mg total) by mouth 2 (two) times daily. 05/12/15 05/11/16  Garrel Ridgel, PA-C    Allergies Penicillins  Family History  Problem  Relation Age of Onset  . Heart failure Mother     Social History Social History   Tobacco Use  . Smoking status: Former Games developer  . Smokeless tobacco: Never Used  Substance Use Topics  . Alcohol use: No  . Drug use: No    Review of Systems Constitutional: No fever/chills Eyes: No visual changes. ENT: No sore throat. No stiff neck no neck pain Cardiovascular: Denies chest pain. Respiratory: Denies shortness of breath. Gastrointestinal:   no vomiting.  No diarrhea.  No constipation. Genitourinary: Negative for dysuria. Musculoskeletal: Negative lower extremity swelling Skin: Negative for rash. Neurological: Negative for severe headaches, focal weakness or numbness.   ____________________________________________   PHYSICAL EXAM:  VITAL SIGNS: ED Triage Vitals [11/27/17 1641]  Enc Vitals Group     BP 109/75     Pulse Rate (!) 107     Resp 20     Temp 98.3 F (36.8 C)     Temp Source Oral     SpO2 100 %     Weight 246 lb (111.6 kg)     Height 5\' 4"  (1.626 m)     Head Circumference      Peak Flow      Pain Score 10     Pain Loc      Pain Edu?      Excl. in GC?     Constitutional: Alert and oriented. Well appearing and in no acute distress. Eyes: Conjunctivae are  normal Head: Atraumatic HEENT: No congestion/rhinnorhea. Mucous membranes are moist.  Oropharynx non-erythematous Neck:   Nontender with no meningismus, no masses, no stridor Cardiovascular: Normal rate, regular rhythm. Grossly normal heart sounds.  Good peripheral circulation. Respiratory: Normal respiratory effort.  No retractions. Lungs CTAB. Abdominal: Soft and positive left lower quadrant left-sided tenderness. No distention. No guarding no rebound Back:  There is no focal tenderness or step off.  there is no midline tenderness there are no lesions noted. there is positive left CVA tenderness Musculoskeletal: No lower extremity tenderness, no upper extremity tenderness. No joint effusions, no DVT  signs strong distal pulses no edema Neurologic:  Normal speech and language. No gross focal neurologic deficits are appreciated.  Skin:  Skin is warm, dry and intact. No rash noted. Psychiatric: Mood and affect are normal. Speech and behavior are normal.  ____________________________________________   LABS (all labs ordered are listed, but only abnormal results are displayed)  Labs Reviewed  COMPREHENSIVE METABOLIC PANEL - Abnormal; Notable for the following components:      Result Value   Glucose, Bld 106 (*)    ALT 12 (*)    All other components within normal limits  CBC - Abnormal; Notable for the following components:   Hemoglobin 10.7 (*)    HCT 34.2 (*)    MCV 73.4 (*)    MCH 23.0 (*)    MCHC 31.4 (*)    RDW 16.4 (*)    All other components within normal limits  URINALYSIS, COMPLETE (UACMP) WITH MICROSCOPIC - Abnormal; Notable for the following components:   Color, Urine RED (*)    APPearance HAZY (*)    Hgb urine dipstick LARGE (*)    Protein, ur 100 (*)    Leukocytes, UA TRACE (*)    Squamous Epithelial / LPF 6-30 (*)    All other components within normal limits  LIPASE, BLOOD  POCT PREGNANCY, URINE  POC URINE PREG, ED    Pertinent labs  results that were available during my care of the patient were reviewed by me and considered in my medical decision making (see chart for details). ____________________________________________  EKG  I personally interpreted any EKGs ordered by me or triage  ____________________________________________  RADIOLOGY  Pertinent labs & imaging results that were available during my care of the patient were reviewed by me and considered in my medical decision making (see chart for details). If possible, patient and/or family made aware of any abnormal findings.  Ct Renal Stone Study  Result Date: 11/27/2017 CLINICAL DATA:  Left upper quadrant and left flank pain since 11/24/2016. EXAM: CT ABDOMEN AND PELVIS WITHOUT CONTRAST  TECHNIQUE: Multidetector CT imaging of the abdomen and pelvis was performed following the standard protocol without IV contrast. COMPARISON:  CT abdomen and pelvis 03/17/2016. FINDINGS: Lower chest: Lung bases are clear. No pleural or pericardial effusion. Hepatobiliary: No focal liver abnormality is seen. No gallstones, gallbladder wall thickening, or biliary dilatation. Pancreas: Unremarkable. No pancreatic ductal dilatation or surrounding inflammatory changes. Spleen: Normal in size without focal abnormality. Adrenals/Urinary Tract: Adrenal glands are unremarkable. Kidneys are normal, without renal calculi, focal lesion, or hydronephrosis. Bladder is unremarkable. Stomach/Bowel: Stomach is within normal limits. Appendix appears normal. No evidence of bowel wall thickening, distention, or inflammatory changes. Vascular/Lymphatic: No significant vascular findings are present. No enlarged abdominal or pelvic lymph nodes. Reproductive: Uterus and bilateral adnexa are unremarkable. Other: The patient has very small fat containing bilateral inguinal hernias and a small fat containing umbilical hernia. No ascites.  Musculoskeletal: Negative. IMPRESSION: Negative for urinary tract stone. No acute abnormality abdomen or pelvis. Small fat containing umbilical and bilateral inguinal hernias. Electronically Signed   By: Drusilla Kannerhomas  Dalessio M.D.   On: 11/27/2017 19:02   ____________________________________________    PROCEDURES  Procedure(s) performed: None  Procedures  Critical Care performed: None  ____________________________________________   INITIAL IMPRESSION / ASSESSMENT AND PLAN / ED COURSE  Pertinent labs & imaging results that were available during my care of the patient were reviewed by me and considered in my medical decision making (see chart for details). With very reproducible left-sided abdominal/flank pain for several days off and on for several months, she had significant hemoglobin in her  urine so I did a CT scan to rule out stone no stone is obviously seen.  Certainly could have passed a kidney stone, or alternatively have a stone that is too small to see.  There are trace leuks and there are positive whites we will send a urine culture.  At this time, there does not appear to be clinical evidence to support the diagnosis of pulmonary embolus, dissection, myocarditis, endocarditis, pericarditis, pericardial tamponade, acute coronary syndrome, pneumothorax, pneumonia, or any other acute intrathoracic pathology that will require admission or acute intervention. Nor is there evidence of any significant intra-abdominal pathology causing this discomfort.  Considering the patient's symptoms, medical history, and physical examination today, I have low suspicion for cholecystitis or biliary pathology, pancreatitis, perforation or bowel obstruction, hernia, intra-abdominal abscess, AAA or dissection, volvulus or intussusception, mesenteric ischemia, ischemic gut, pyelonephritis or appendicitis.  Extensive return precautions and follow-up given and understood.  ----------------------------------------- 7:27 PM on 11/27/2017 -----------------------------------------  Pt is absolutely pain-free at this time requesting discharge.    ____________________________________________   FINAL CLINICAL IMPRESSION(S) / ED DIAGNOSES  Final diagnoses:  None      This chart was dictated using voice recognition software.  Despite best efforts to proofread,  errors can occur which can change meaning.      Jeanmarie PlantMcShane, James A, MD 11/27/17 1911    Jeanmarie PlantMcShane, James A, MD 11/27/17 [phone]

## 2017-11-27 NOTE — ED Notes (Signed)
Per MD McShane , given prn fent at this time for one more dose.

## 2017-11-27 NOTE — ED Notes (Signed)
NAD noted at time of D/C. Pt denies questions or concerns. Pt ambulatory to the lobby at this time.  

## 2017-11-27 NOTE — ED Triage Notes (Signed)
Patient presents to the ED with intermittent left upper quadrant pain since Saturday.  Patient is guarding area.  Patient states she has had similar pain previously and was diagnosed with muscle spasms but patient appears very uncomfortable in triage and is tearful, having difficulty being still.  Patient denies any flank pain and denies vomiting.

## 2017-11-27 NOTE — Discharge Instructions (Signed)
Although we do not see a kidney stone, your symptoms are certainly consistent with that.  We asked that he do not drive tonight.  If you have increased pain, fever, vomiting, or other concerns please return to the emergency room, otherwise, follow closely with primary care.

## 2017-12-01 LAB — URINE CULTURE: Special Requests: NORMAL

## 2018-02-18 ENCOUNTER — Encounter: Payer: Self-pay | Admitting: Obstetrics and Gynecology

## 2018-02-18 ENCOUNTER — Ambulatory Visit (INDEPENDENT_AMBULATORY_CARE_PROVIDER_SITE_OTHER): Payer: Medicaid Other | Admitting: Obstetrics and Gynecology

## 2018-02-18 VITALS — BP 110/60 | HR 78 | Ht 64.0 in | Wt 239.0 lb

## 2018-02-18 DIAGNOSIS — D500 Iron deficiency anemia secondary to blood loss (chronic): Secondary | ICD-10-CM | POA: Diagnosis not present

## 2018-02-18 DIAGNOSIS — N920 Excessive and frequent menstruation with regular cycle: Secondary | ICD-10-CM | POA: Diagnosis not present

## 2018-02-18 NOTE — Patient Instructions (Signed)
I value your feedback and entrusting us with your care. If you get a [place] [facility] patient survey, I would appreciate you taking the time to let us know about your experience today. Thank you! 

## 2018-02-18 NOTE — Progress Notes (Signed)
Chief Complaint  Patient presents with  . Menorrhagia    HPI:      [name] is a 32 y.o. [phone] who LMP was Patient's last menstrual period was 02/18/2018., presents today for menorrhagia with anemia. Pt was seen by Dr. Tiburcio PeaHarris in 2017 for the same problem and tx options discussed. Pt was referred back by PCP for tx. Pt states menses are Q2-3 wks, last 3-7 days, all heavy, changing pads Q1-1 1/2 hrs and with large clots first few days. She sometimes has gushes that soils her clothes. No BTB, dysmen. Sx for the past 2 yrs, since last pregnancy/PP TL. Pt states she has had neg labs with PCP except anemia but no recent u/s.  She does not want hormones. Tried OCPs 2017 without relief.  She states when she talked with Dr. Tiburcio PeaHarris 2017, he suggested hyst because pt declined hormones and didn't want other treatments. 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   She is currently anemic and has been referred to hematologist this wk.   She is sex active, no dsypareunia/bleeding with sex.    Past Medical History:  Diagnosis Date  . Anemia   . Anxiety   . Depression   . Menorrhagia     Past Surgical History:  Procedure Laterality Date  . CESAREAN SECTION    . TUBAL LIGATION      Family History  Problem Relation Age of Onset  . Heart failure Mother   . Hypertension Mother     Social History   Socioeconomic History  . Marital status: Single    Spouse name: Not on file  . Number of children: Not on file  . Years of education: Not on file  . Highest education level: Not on file  Social Needs  . Financial resource strain: Not on file  . Food insecurity - worry: Not on file  . Food insecurity - inability: Not on file  . Transportation needs - medical: Not on file  . Transportation needs - non-medical: Not on file  Occupational History  . Not on file  Tobacco Use  . Smoking status: Former Games developermoker  . Smokeless tobacco: Never Used  Substance  and Sexual Activity  . Alcohol use: No  . Drug use: No  . Sexual activity: Yes    Birth control/protection: None  Other Topics Concern  . Not on file  Social History Narrative  . Not on file     Current Outpatient Medications:  .  acyclovir (ZOVIRAX) 400 MG tablet, Take 2 tablets (800 mg total) by mouth 5 (five) times daily. (Patient not taking: Reported on 02/18/2018), Disp: 70 tablet, Rfl: 0 .  ferrous fumarate-iron polysaccharide complex (TANDEM) 162-115.2 MG CAPS capsule, Take by mouth., Disp: , Rfl:  .  ibuprofen (ADVIL,MOTRIN) 800 MG tablet, Take 1 tablet (800 mg total) by mouth every 8 (eight) hours as needed. (Patient not taking: Reported on 02/18/2018), Disp: 30 tablet, Rfl: 0 .  loratadine (CLARITIN) 10 MG tablet, Take 1 tablet (10 mg total) by mouth daily., Disp: 30 tablet, Rfl: 2 .  naproxen (NAPROSYN) 375 MG tablet, Take 1 tablet (375 mg total) by mouth 2 (two) times daily with a meal. (Patient not taking: Reported on 02/18/2018), Disp: 12 tablet, Rfl: 0 .  ondansetron (ZOFRAN) 4 MG tablet, Take 1 tablet (4 mg total) by mouth every 8 (eight) hours as needed for nausea or vomiting. (Patient not taking: Reported on 02/18/2018), Disp:  8 tablet, Rfl: 0 .  ranitidine (ZANTAC) 150 MG tablet, Take 1 tablet (150 mg total) by mouth 2 (two) times daily., Disp: 14 tablet, Rfl: 0   ROS:  Review of Systems  Constitutional: Negative for fever.  Gastrointestinal: Negative for blood in stool, constipation, diarrhea, nausea and vomiting.  Genitourinary: Positive for menstrual problem. Negative for dyspareunia, dysuria, flank pain, frequency, hematuria, urgency, vaginal bleeding, vaginal discharge and vaginal pain.  Musculoskeletal: Negative for back pain.  Skin: Negative for rash.  Neurological: Positive for headaches.     OBJECTIVE:   Vitals:  BP 110/60   Pulse 78   Ht 5\' 4"  (1.626 m)   Wt 239 lb (108.4 kg)   LMP 02/18/2018   BMI 41.02 kg/m   Physical Exam  Constitutional: She is  oriented to person, place, and time and well-developed, well-nourished, and in no distress.  Neck: Normal range of motion.  Pulmonary/Chest: Effort normal.  Musculoskeletal: Normal range of motion.  Neurological: She is alert and oriented to person, place, and time.  Psychiatric: Memory, affect and judgment normal.  Vitals reviewed.  PT REFUSED EXAM SINCE BLEEDING NOW WITH PERIOD.  Assessment/Plan: Menorrhagia with regular cycle - Check GYN u/s. F/u with [facility] to discuss results/mgmt. Mirena handout given. Discussed ablation/hyst since pt doens't want hormones but she may try IUD.  - Plan: US PELVIS TRANSVANGINAL NON-OB (TV ONLY)  Iron deficiency anemia due to chronic blood loss    Return in about 1 week (around 02/25/2018) for GYN u/s, with f/u [facility] appt for menorrhagia.  Alicia B. Copland, PA-C 02/18/2018 11:00 AM

## 2018-02-21 ENCOUNTER — Other Ambulatory Visit: Payer: Self-pay

## 2018-02-21 ENCOUNTER — Inpatient Hospital Stay: Payer: Medicaid Other | Attending: Oncology | Admitting: Oncology

## 2018-02-21 ENCOUNTER — Encounter: Payer: Self-pay | Admitting: Oncology

## 2018-02-21 VITALS — BP 100/69 | HR 80 | Temp 98.3°F | Ht 67.0 in | Wt 241.3 lb

## 2018-02-21 DIAGNOSIS — N920 Excessive and frequent menstruation with regular cycle: Secondary | ICD-10-CM | POA: Insufficient documentation

## 2018-02-21 DIAGNOSIS — R5383 Other fatigue: Secondary | ICD-10-CM | POA: Diagnosis not present

## 2018-02-21 DIAGNOSIS — Z79899 Other long term (current) drug therapy: Secondary | ICD-10-CM | POA: Diagnosis not present

## 2018-02-21 DIAGNOSIS — D500 Iron deficiency anemia secondary to blood loss (chronic): Secondary | ICD-10-CM | POA: Diagnosis not present

## 2018-02-21 DIAGNOSIS — Z87891 Personal history of nicotine dependence: Secondary | ICD-10-CM | POA: Diagnosis not present

## 2018-02-21 DIAGNOSIS — R5381 Other malaise: Secondary | ICD-10-CM | POA: Diagnosis not present

## 2018-02-21 DIAGNOSIS — D509 Iron deficiency anemia, unspecified: Secondary | ICD-10-CM

## 2018-02-21 NOTE — Progress Notes (Signed)
Patient here today as a new patient for anemia  

## 2018-02-21 NOTE — Progress Notes (Signed)
Hematology/Oncology Consult note [facility] [phone] [phone]   Patient Care Team: Fayrene Helper, NP as PCP - General (Nurse Practitioner)  REFERRING PROVIDER: Fayrene Helper, NP CHIEF COMPLAINTS/PURPOSE OF CONSULTATION:  Evaluation of anemia  HISTORY OF PRESENTING ILLNESS:  Cathy Trevino is a  32 y.o.  female with PMH listed below who was referred to me for evaluation of anemia.  Patient reports feeling fatigue, lack of energy.  She also reports feeling cold all the time.  Her primary care physician has checked her thyroid function which was normal.  Reports having irregular heavy menstrual..  She has an appointment to see GYN.  She has been taking iron supplementation orally for the past year.  Despite that her hemoglobin remains low. Recently she has had labs done with her primary care physician.  Medical record review was performed. She has lab done on February 08, 2018, CBC showed hemoglobin 9.9, MCV 73, white count 7.1, platelet counts 248,000.  TSH 0.9, Patient is a single mom, she has 3 kids.     Review of Systems  Constitutional: Positive for malaise/fatigue. Negative for chills, fever and weight loss.  HENT: Negative for congestion, hearing loss, nosebleeds and tinnitus.   Eyes: Negative for photophobia and pain.  Respiratory: Negative for cough, hemoptysis and shortness of breath.   Cardiovascular: Negative for chest pain and palpitations.  Gastrointestinal: Negative for abdominal pain, blood in stool, heartburn, nausea and vomiting.  Genitourinary: Negative for dysuria and urgency.       Heavy menstrual period   Musculoskeletal: Negative for back pain and myalgias.  Skin: Negative for rash.  Neurological: Negative for dizziness, sensory change, speech change and weakness.  Endo/Heme/Allergies: Does not bruise/bleed easily.  Psychiatric/Behavioral: Negative for depression and substance abuse.    MEDICAL  HISTORY:  Past Medical History:  Diagnosis Date  . Anemia   . Anxiety   . Asthma   . Depression   . Menorrhagia     SURGICAL HISTORY: Past Surgical History:  Procedure Laterality Date  . CESAREAN SECTION    . TUBAL LIGATION      SOCIAL HISTORY: Social History   Socioeconomic History  . Marital status: Single    Spouse name: Not on file  . Number of children: Not on file  . Years of education: Not on file  . Highest education level: Not on file  Social Needs  . Financial resource strain: Not on file  . Food insecurity - worry: Not on file  . Food insecurity - inability: Not on file  . Transportation needs - medical: Not on file  . Transportation needs - non-medical: Not on file  Occupational History  . Not on file  Tobacco Use  . Smoking status: Former Smoker    Packs/day: 0.25    Years: 5.00    Pack years: 1.25    Types: Cigarettes    Last attempt to quit: 02/22/2016    Years since quitting: 2.0  . Smokeless tobacco: Never Used  Substance and Sexual Activity  . Alcohol use: No  . Drug use: No  . Sexual activity: Yes    Birth control/protection: None  Other Topics Concern  . Not on file  Social History Narrative  . Not on file    FAMILY HISTORY: Family History  Problem Relation Age of Onset  . Heart failure Mother   . Hypertension Mother     ALLERGIES:  is allergic to penicillins.  MEDICATIONS:  Current Outpatient Medications  Medication Sig Dispense Refill  . acetaminophen (TYLENOL) 500 MG tablet Take 500 mg by mouth every 6 (six) hours as needed.    . fluticasone (FLONASE) 50 MCG/ACT nasal spray Place into both nostrils daily.    . IRON, FERROUS GLUCONATE, PO Take by mouth.    . loratadine (CLARITIN) 10 MG tablet Take 1 tablet (10 mg total) by mouth daily. 30 tablet 2   No current facility-administered medications for this visit.      PHYSICAL EXAMINATION: ECOG PERFORMANCE STATUS: 1 - Symptomatic but completely ambulatory Vitals:   02/21/18  0932  BP: 100/69  Pulse: 80  Temp: 98.3 F (36.8 C)   Filed Weights   02/21/18 0932  Weight: 241 lb 4.7 oz (109.5 kg)    Physical Exam  Constitutional: She is oriented to person, place, and time and well-developed, well-nourished, and in no distress. No distress.  HENT:  Mouth/Throat: No oropharyngeal exudate.  pallor  Eyes: EOM are normal. Pupils are equal, round, and reactive to light. No scleral icterus.  Neck: Normal range of motion. Neck supple.  Cardiovascular: Normal rate, regular rhythm and normal heart sounds.  Pulmonary/Chest: Effort normal and breath sounds normal. No respiratory distress.  Abdominal: Bowel sounds are normal. She exhibits no distension. There is no rebound.  Musculoskeletal: Normal range of motion. She exhibits no edema.  Neurological: She is alert and oriented to person, place, and time. No cranial nerve deficit.  Skin: Skin is warm and dry.  Psychiatric: Affect and judgment normal.     LABORATORY DATA:  I have reviewed the data as listed Lab Results  Component Value Date   WBC 8.7 11/27/2017   HGB 10.7 (L) 11/27/2017   HCT 34.2 (L) 11/27/2017   MCV 73.4 (L) 11/27/2017   PLT 271 11/27/2017   Recent Labs    11/27/17 1644  NA 138  K 3.6  CL 103  CO2 26  GLUCOSE 106*  BUN 8  CREATININE 0.62  CALCIUM 9.1  GFRNONAA >60  GFRAA >60  PROT 8.1  ALBUMIN 3.8  AST 17  ALT 12*  ALKPHOS 61  BILITOT 0.5       ASSESSMENT & PLAN:  1. Microcytic anemia   2. Iron deficiency anemia due to chronic blood loss    Check iron panel.   Plan IV iron with Feraheme 510mg  weekly x 2 doses. Allergy reactions/infusion reaction including anaphylactic reaction discussed with patient. Patient voices understanding and willing to proceed.   All questions were answered. The patient knows to call the clinic with any problems questions or concerns.  Return of visit: 6 weeks  Thank you for this kind referral and the opportunity to participate in the care  of this patient. A copy of today's note is routed to referring provider    Rickard PatienceZhou Byren Pankow, MD, PhD Hematology Oncology [facility] Pager- [phone] 02/21/2018

## 2018-02-22 ENCOUNTER — Inpatient Hospital Stay: Payer: Medicaid Other

## 2018-02-22 DIAGNOSIS — D500 Iron deficiency anemia secondary to blood loss (chronic): Secondary | ICD-10-CM | POA: Diagnosis not present

## 2018-02-22 DIAGNOSIS — D509 Iron deficiency anemia, unspecified: Secondary | ICD-10-CM

## 2018-02-22 LAB — IRON AND TIBC
IRON: 153 ug/dL (ref 28–170)
Saturation Ratios: 35 % — ABNORMAL HIGH (ref 10.4–31.8)
TIBC: 439 ug/dL (ref 250–450)
UIBC: 286 ug/dL

## 2018-02-22 LAB — FERRITIN: Ferritin: 2 ng/mL — ABNORMAL LOW (ref 11–307)

## 2018-02-25 ENCOUNTER — Encounter: Payer: Self-pay | Admitting: Oncology

## 2018-02-25 DIAGNOSIS — D509 Iron deficiency anemia, unspecified: Secondary | ICD-10-CM | POA: Insufficient documentation

## 2018-02-25 NOTE — Addendum Note (Signed)
Addended by: Rickard PatienceYU, Kennedie Pardoe on: 02/25/2018 04:48 PM   Modules accepted: Orders

## 2018-02-26 ENCOUNTER — Inpatient Hospital Stay: Payer: Medicaid Other

## 2018-02-26 VITALS — BP 103/70 | HR 93 | Temp 95.8°F | Resp 20

## 2018-02-26 DIAGNOSIS — D509 Iron deficiency anemia, unspecified: Secondary | ICD-10-CM

## 2018-02-26 DIAGNOSIS — D500 Iron deficiency anemia secondary to blood loss (chronic): Secondary | ICD-10-CM | POA: Diagnosis not present

## 2018-02-26 MED ORDER — SODIUM CHLORIDE 0.9 % IV SOLN
Freq: Once | INTRAVENOUS | Status: AC
  Administered 2018-02-26: 11:00:00 via INTRAVENOUS
  Filled 2018-02-26: qty 1000

## 2018-02-26 MED ORDER — SODIUM CHLORIDE 0.9 % IV SOLN
510.0000 mg | Freq: Once | INTRAVENOUS | Status: AC
  Administered 2018-02-26: 510 mg via INTRAVENOUS
  Filled 2018-02-26: qty 17

## 2018-02-26 NOTE — Patient Instructions (Signed)

## 2018-02-28 ENCOUNTER — Other Ambulatory Visit: Payer: Medicaid Other

## 2018-02-28 ENCOUNTER — Ambulatory Visit: Payer: Medicaid Other | Admitting: Obstetrics & Gynecology

## 2018-02-28 ENCOUNTER — Encounter: Payer: Self-pay | Admitting: Nurse Practitioner

## 2018-03-05 ENCOUNTER — Inpatient Hospital Stay: Payer: Medicaid Other

## 2018-03-05 VITALS — BP 103/72 | HR 86 | Temp 96.5°F | Resp 18

## 2018-03-05 DIAGNOSIS — D509 Iron deficiency anemia, unspecified: Secondary | ICD-10-CM

## 2018-03-05 DIAGNOSIS — D500 Iron deficiency anemia secondary to blood loss (chronic): Secondary | ICD-10-CM

## 2018-03-05 MED ORDER — SODIUM CHLORIDE 0.9 % IV SOLN
Freq: Once | INTRAVENOUS | Status: AC
  Administered 2018-03-05: 09:00:00 via INTRAVENOUS
  Filled 2018-03-05: qty 1000

## 2018-03-05 MED ORDER — SODIUM CHLORIDE 0.9 % IV SOLN
510.0000 mg | Freq: Once | INTRAVENOUS | Status: AC
  Administered 2018-03-05: 510 mg via INTRAVENOUS
  Filled 2018-03-05: qty 17

## 2018-03-05 MED ORDER — FERUMOXYTOL INJECTION 510 MG/17 ML
INTRAVENOUS | Status: AC
  Filled 2018-03-05: qty 17

## 2018-03-05 NOTE — Patient Instructions (Signed)

## 2018-03-07 ENCOUNTER — Ambulatory Visit (INDEPENDENT_AMBULATORY_CARE_PROVIDER_SITE_OTHER): Payer: Medicaid Other

## 2018-03-07 ENCOUNTER — Telehealth: Payer: Self-pay | Admitting: Obstetrics & Gynecology

## 2018-03-07 ENCOUNTER — Ambulatory Visit (INDEPENDENT_AMBULATORY_CARE_PROVIDER_SITE_OTHER): Payer: Medicaid Other | Admitting: Obstetrics & Gynecology

## 2018-03-07 ENCOUNTER — Encounter: Payer: Self-pay | Admitting: Obstetrics & Gynecology

## 2018-03-07 VITALS — BP 102/60 | HR 79 | Ht 64.0 in | Wt 240.0 lb

## 2018-03-07 DIAGNOSIS — N920 Excessive and frequent menstruation with regular cycle: Secondary | ICD-10-CM | POA: Diagnosis not present

## 2018-03-07 NOTE — Progress Notes (Signed)
  HPI: Pt has had menorrhagia with prolpnged and heavy bleeding, min pain, min assoc sx's of nausea and breast T.  Tried OCP in past, did not do well. Pt states menses are Q2-3 wks, last 3-7 days, all heavy, changing pads Q1-1 1/2 hrs and with large clots first few days. She sometimes has gushes that soils her clothes. No BTB, dysmen.    Prior BTL, no desire for pregnancy.  Ultrasound demonstrates no masses seen, no cysts These findings are Pelvis normal  PMHx: She  has a past medical history of Anemia, Anxiety, Asthma, Depression, and Menorrhagia. Also,  has a past surgical history that includes Cesarean section and Tubal ligation., family history includes Heart failure in her mother; Hypertension in her mother.,  reports that she quit smoking about 2 years ago. Her smoking use included cigarettes. She has a 1.25 pack-year smoking history. She has never used smokeless tobacco. She reports that she does not drink alcohol or use drugs.  She has a current medication list which includes the following prescription(s): acetaminophen, fluticasone, ferrous gluconate, and loratadine. Also, is allergic to penicillins.  Review of Systems  All other systems reviewed and are negative.  Objective: BP 102/60   Pulse 79   Ht 5\' 4"  (1.626 m)   Wt 240 lb (108.9 kg)   LMP 02/18/2018   BMI 41.20 kg/m   Physical examination Constitutional NAD, Conversant  Skin No rashes, lesions or ulceration.   Extremities: Moves all appropriately.  Normal ROM for age. No lymphadenopathy.  Neuro: Grossly intact  Psych: Oriented to PPT.  Normal mood. Normal affect.   Assessment:  Menorrhagia with regular cycle   Patient has abnormal uterine bleeding.Treatment option for menorrhagia or menometrorrhagia discussed in great detail with the patient.  Options include hormonal therapy, IUD therapy such as Mirena, D&C, Ablation, and Hysterectomy.  The pros and cons of each option discussed with patient.  Desires Mirena IUD.   Info given.  To schedule.  A total of 15 minutes were spent face-to-face with the patient during this encounter and over half of that time dealt with counseling and coordination of care.  Annamarie MajorPaul Rehaan Viloria, MD, Merlinda FrederickFACOG [facility], [facility] 03/07/2018  2:16 PM

## 2018-03-07 NOTE — Telephone Encounter (Signed)
[facility]

## 2018-03-07 NOTE — Patient Instructions (Signed)

## 2018-03-08 NOTE — Telephone Encounter (Signed)
Mirena reserved for this patient. 

## 2018-03-11 ENCOUNTER — Ambulatory Visit (INDEPENDENT_AMBULATORY_CARE_PROVIDER_SITE_OTHER): Payer: Medicaid Other | Admitting: Obstetrics & Gynecology

## 2018-03-11 ENCOUNTER — Encounter: Payer: Self-pay | Admitting: Obstetrics & Gynecology

## 2018-03-11 VITALS — BP 110/70 | HR 91 | Ht 64.0 in | Wt 238.0 lb

## 2018-03-11 DIAGNOSIS — D500 Iron deficiency anemia secondary to blood loss (chronic): Secondary | ICD-10-CM

## 2018-03-11 DIAGNOSIS — N920 Excessive and frequent menstruation with regular cycle: Secondary | ICD-10-CM | POA: Insufficient documentation

## 2018-03-11 DIAGNOSIS — Z30430 Encounter for insertion of intrauterine contraceptive device: Secondary | ICD-10-CM

## 2018-03-11 NOTE — Patient Instructions (Signed)

## 2018-03-11 NOTE — Progress Notes (Signed)
  IUD PROCEDURE NOTE:  Cathy Trevino is a 32 y.o. [phone] here for IUD insertion. No GYN concerns.  Last pap smear was normal.  Mirena due to heavy periods.  [facility]  IUD Insertion Procedure Note Patient identified, informed consent performed, consent signed.   Discussed risks of irregular bleeding, cramping, infection, malpositioning or misplacement of the IUD outside the uterus which may require further procedure such as laparoscopy, risk of failure <1%. Time out was performed.  Urine pregnancy test negative.  A bimanual exam showed the uterus to be midposition.  Speculum placed in the vagina.  Cervix visualized.  Cleaned with Betadine x 2.  Grasped anteriorly with a single tooth tenaculum.  Uterus sounded to 7 cm.   IUD placed per manufacturer's recommendations.  Strings trimmed to 3 cm. Tenaculum was removed, good hemostasis noted.  Patient tolerated procedure well.   Patient was given post-procedure instructions.  She was advised to have backup contraception for one week.  Patient was also asked to check IUD strings periodically and follow up in 4 weeks for IUD check.  Cathy MajorPaul Tyquon Near, MD, Cathy Trevino, Cathy Trevino 03/11/2018  9:45 AM

## 2018-03-15 NOTE — Telephone Encounter (Signed)
Mirena received/charged 03/11/18

## 2018-04-02 ENCOUNTER — Inpatient Hospital Stay: Payer: Medicaid Other | Attending: Oncology

## 2018-04-02 DIAGNOSIS — D500 Iron deficiency anemia secondary to blood loss (chronic): Secondary | ICD-10-CM | POA: Insufficient documentation

## 2018-04-02 DIAGNOSIS — Z87891 Personal history of nicotine dependence: Secondary | ICD-10-CM | POA: Diagnosis not present

## 2018-04-02 DIAGNOSIS — R5383 Other fatigue: Secondary | ICD-10-CM | POA: Diagnosis not present

## 2018-04-02 DIAGNOSIS — N920 Excessive and frequent menstruation with regular cycle: Secondary | ICD-10-CM | POA: Insufficient documentation

## 2018-04-02 DIAGNOSIS — Z880 Allergy status to penicillin: Secondary | ICD-10-CM | POA: Insufficient documentation

## 2018-04-02 DIAGNOSIS — Z79899 Other long term (current) drug therapy: Secondary | ICD-10-CM | POA: Insufficient documentation

## 2018-04-02 DIAGNOSIS — D509 Iron deficiency anemia, unspecified: Secondary | ICD-10-CM

## 2018-04-02 LAB — CBC WITH DIFFERENTIAL/PLATELET
BASOS ABS: 0.1 10*3/uL (ref 0–0.1)
Basophils Relative: 1 %
Eosinophils Absolute: 0.2 10*3/uL (ref 0–0.7)
Eosinophils Relative: 4 %
HEMATOCRIT: 38.9 % (ref 35.0–47.0)
Hemoglobin: 13 g/dL (ref 12.0–16.0)
LYMPHS PCT: 21 %
Lymphs Abs: 1.4 10*3/uL (ref 1.0–3.6)
MCH: 27 pg (ref 26.0–34.0)
MCHC: 33.4 g/dL (ref 32.0–36.0)
MCV: 80.8 fL (ref 80.0–100.0)
MONO ABS: 0.4 10*3/uL (ref 0.2–0.9)
MONOS PCT: 6 %
NEUTROS ABS: 4.5 10*3/uL (ref 1.4–6.5)
Neutrophils Relative %: 68 %
Platelets: 185 10*3/uL (ref 150–440)
RBC: 4.82 MIL/uL (ref 3.80–5.20)
RDW: 24.4 % — AB (ref 11.5–14.5)
WBC: 6.5 10*3/uL (ref 3.6–11.0)

## 2018-04-02 LAB — IRON AND TIBC
Iron: 68 ug/dL (ref 28–170)
Saturation Ratios: 22 % (ref 10.4–31.8)
TIBC: 308 ug/dL (ref 250–450)
UIBC: 240 ug/dL

## 2018-04-02 LAB — FERRITIN: FERRITIN: 32 ng/mL (ref 11–307)

## 2018-04-04 ENCOUNTER — Inpatient Hospital Stay (HOSPITAL_BASED_OUTPATIENT_CLINIC_OR_DEPARTMENT_OTHER): Payer: Medicaid Other | Admitting: Oncology

## 2018-04-04 ENCOUNTER — Ambulatory Visit: Payer: Medicaid Other | Admitting: Oncology

## 2018-04-04 ENCOUNTER — Other Ambulatory Visit: Payer: Self-pay

## 2018-04-04 ENCOUNTER — Inpatient Hospital Stay: Payer: Medicaid Other

## 2018-04-04 ENCOUNTER — Ambulatory Visit: Payer: Medicaid Other

## 2018-04-04 ENCOUNTER — Encounter: Payer: Self-pay | Admitting: Oncology

## 2018-04-04 VITALS — BP 111/72 | HR 81 | Temp 98.6°F | Ht 64.0 in | Wt 235.2 lb

## 2018-04-04 VITALS — BP 118/73 | HR 77 | Temp 98.4°F | Resp 18

## 2018-04-04 DIAGNOSIS — Z880 Allergy status to penicillin: Secondary | ICD-10-CM

## 2018-04-04 DIAGNOSIS — N920 Excessive and frequent menstruation with regular cycle: Secondary | ICD-10-CM

## 2018-04-04 DIAGNOSIS — D500 Iron deficiency anemia secondary to blood loss (chronic): Secondary | ICD-10-CM

## 2018-04-04 DIAGNOSIS — Z87891 Personal history of nicotine dependence: Secondary | ICD-10-CM

## 2018-04-04 DIAGNOSIS — Z79899 Other long term (current) drug therapy: Secondary | ICD-10-CM | POA: Diagnosis not present

## 2018-04-04 DIAGNOSIS — R5383 Other fatigue: Secondary | ICD-10-CM

## 2018-04-04 DIAGNOSIS — D509 Iron deficiency anemia, unspecified: Secondary | ICD-10-CM

## 2018-04-04 MED ORDER — FERUMOXYTOL INJECTION 510 MG/17 ML
INTRAVENOUS | Status: AC
  Filled 2018-04-04: qty 17

## 2018-04-04 MED ORDER — SODIUM CHLORIDE 0.9 % IV SOLN
Freq: Once | INTRAVENOUS | Status: AC
  Administered 2018-04-04: 09:00:00 via INTRAVENOUS
  Filled 2018-04-04: qty 1000

## 2018-04-04 MED ORDER — SODIUM CHLORIDE 0.9 % IV SOLN
510.0000 mg | Freq: Once | INTRAVENOUS | Status: AC
  Administered 2018-04-04: 510 mg via INTRAVENOUS
  Filled 2018-04-04: qty 17

## 2018-04-04 NOTE — Progress Notes (Signed)
Patient here today for follow up.   

## 2018-04-04 NOTE — Addendum Note (Signed)
Addended by: Marchia MeiersEASTWOOD, Nabilah Davoli M on: 04/04/2018 11:02 AM   Modules accepted: Orders

## 2018-04-04 NOTE — Progress Notes (Signed)
Hematology/Oncology Follow Up Note [facility] [phone] Fax:([phone]   Patient Care Team: Fayrene HelperBoswell, Chelsa H, NP as PCP - General (Nurse Practitioner)  REFERRING PROVIDER: Fayrene HelperBoswell, Chelsa H, NP CHIEF COMPLAINTS/PURPOSE OF CONSULTATION:  Evaluation of anemia  HISTORY OF PRESENTING ILLNESS:  Cathy Trevino is a  32 y.o.  female with PMH listed below who was referred to me for evaluation of anemia.  Patient reports feeling fatigue, lack of energy.  She also reports feeling cold all the time.  Her primary care physician has checked her thyroid function which was normal.  Reports having irregular heavy menstrual..  She has an appointment to see GYN.  She has been taking iron supplementation orally for the past year.  Despite that her hemoglobin remains low. Recently she has had labs done with her primary care physician.  Medical record review was performed. She has lab done on February 08, 2018, CBC showed hemoglobin 9.9, MCV 73, white count 7.1, platelet counts 248,000.  TSH 0.9, Patient is a single mom, she has 3 kids.   INTERVAL HISTORY Cathy Trevino is a 32 y.o. female who has above history reviewed by me today presents for follow up visit for management of  Iron deficiency anemia. She has been seen by GYN and has been started on IUD. Reports that IUD not working and still have daily bleeding. Fatigue is much better today.    Review of Systems  Constitutional: Negative for chills, fever, malaise/fatigue and weight loss.  HENT: Negative for congestion, hearing loss, nosebleeds and tinnitus.   Eyes: Negative for photophobia and pain.  Respiratory: Negative for cough, hemoptysis and shortness of breath.   Cardiovascular: Negative for chest pain and palpitations.  Gastrointestinal: Negative for abdominal pain, blood in stool, heartburn, nausea and vomiting.  Genitourinary: Negative for dysuria and urgency.       Heavy menstrual period     Musculoskeletal: Negative for back pain and myalgias.  Skin: Negative for rash.  Neurological: Negative for dizziness, sensory change, speech change and weakness.  Endo/Heme/Allergies: Does not bruise/bleed easily.  Psychiatric/Behavioral: Negative for depression and substance abuse.    MEDICAL HISTORY:  Past Medical History:  Diagnosis Date  . Anemia   . Anxiety   . Asthma   . Depression   . Menorrhagia     SURGICAL HISTORY: Past Surgical History:  Procedure Laterality Date  . CESAREAN SECTION    . TUBAL LIGATION      SOCIAL HISTORY: Social History   Socioeconomic History  . Marital status: Single    Spouse name: Not on file  . Number of children: Not on file  . Years of education: Not on file  . Highest education level: Not on file  Occupational History  . Not on file  Social Needs  . Financial resource strain: Not on file  . Food insecurity:    Worry: Not on file    Inability: Not on file  . Transportation needs:    Medical: Not on file    Non-medical: Not on file  Tobacco Use  . Smoking status: Former Smoker    Packs/day: 0.25    Years: 5.00    Pack years: 1.25    Types: Cigarettes    Last attempt to quit: 02/22/2016    Years since quitting: 2.1  . Smokeless tobacco: Never Used  Substance and Sexual Activity  . Alcohol use: No  . Drug use: No  . Sexual activity: Yes    Birth control/protection:  None  Lifestyle  . Physical activity:    Days per week: Not on file    Minutes per session: Not on file  . Stress: Not on file  Relationships  . Social connections:    Talks on phone: Not on file    Gets together: Not on file    Attends religious service: Not on file    Active member of club or organization: Not on file    Attends meetings of clubs or organizations: Not on file    Relationship status: Not on file  . Intimate partner violence:    Fear of current or ex partner: Not on file    Emotionally abused: Not on file    Physically abused: Not on  file    Forced sexual activity: Not on file  Other Topics Concern  . Not on file  Social History Narrative  . Not on file    FAMILY HISTORY: Family History  Problem Relation Age of Onset  . Heart failure Mother   . Hypertension Mother     ALLERGIES:  is allergic to penicillins.  MEDICATIONS:  Current Outpatient Medications  Medication Sig Dispense Refill  . acetaminophen (TYLENOL) 500 MG tablet Take 500 mg by mouth every 6 (six) hours as needed.    . fluticasone (FLONASE) 50 MCG/ACT nasal spray Place into both nostrils daily.    . IRON, FERROUS GLUCONATE, PO Take by mouth.    . loratadine (CLARITIN) 10 MG tablet Take 1 tablet (10 mg total) by mouth daily. 30 tablet 2   No current facility-administered medications for this visit.      PHYSICAL EXAMINATION: ECOG PERFORMANCE STATUS: 1 - Symptomatic but completely ambulatory Vitals:   04/04/18 0901  BP: 111/72  Pulse: 81  Temp: 98.6 F (37 C)   Filed Weights   04/04/18 0901  Weight: 235 lb 3.7 oz (106.7 kg)    Physical Exam  Constitutional: She is oriented to person, place, and time and well-developed, well-nourished, and in no distress. No distress.  HENT:  Mouth/Throat: No oropharyngeal exudate.  Eyes: Pupils are equal, round, and reactive to light. EOM are normal. No scleral icterus.  Neck: Normal range of motion. Neck supple.  Cardiovascular: Normal rate, regular rhythm and normal heart sounds.  No murmur heard. Pulmonary/Chest: Effort normal and breath sounds normal. No respiratory distress.  Abdominal: Bowel sounds are normal. She exhibits no distension. There is no rebound.  Musculoskeletal: Normal range of motion. She exhibits no edema.  Lymphadenopathy:    She has no cervical adenopathy.  Neurological: She is alert and oriented to person, place, and time. No cranial nerve deficit.  Skin: Skin is warm and dry. No erythema.  Psychiatric: Memory, affect and judgment normal.     LABORATORY DATA:  I have  reviewed the data as listed Lab Results  Component Value Date   WBC 6.5 04/02/2018   HGB 13.0 04/02/2018   HCT 38.9 04/02/2018   MCV 80.8 04/02/2018   PLT 185 04/02/2018   Recent Labs    11/27/17 1644  NA 138  K 3.6  CL 103  CO2 26  GLUCOSE 106*  BUN 8  CREATININE 0.62  CALCIUM 9.1  GFRNONAA >60  GFRAA >60  PROT 8.1  ALBUMIN 3.8  AST 17  ALT 12*  ALKPHOS 61  BILITOT 0.5       ASSESSMENT & PLAN:  1. Iron deficiency anemia due to chronic blood loss   2. Menorrhagia with regular cycle   iron  panel and Cbc tests reviewed and discussed with patient. Ferritin 35.  Plan IV iron with Feraheme 510mg  weekly for one additional dose today. Follow up with GYN.   All questions were answered. The patient knows to call the clinic with any problems questions or concerns.  Return of visit: 2 months.  Thank you for this kind referral and the opportunity to participate in the care of this patient. A copy of today's note is routed to referring provider    Rickard Patience, MD, PhD Hematology Oncology [facility] Pager- [phone] 04/04/2018

## 2018-04-15 ENCOUNTER — Ambulatory Visit: Payer: Medicaid Other | Admitting: Obstetrics & Gynecology

## 2018-04-15 ENCOUNTER — Telehealth: Payer: Self-pay | Admitting: Obstetrics & Gynecology

## 2018-04-15 ENCOUNTER — Other Ambulatory Visit: Payer: Self-pay | Admitting: Obstetrics & Gynecology

## 2018-04-15 MED ORDER — MEDROXYPROGESTERONE ACETATE 10 MG PO TABS: 20.0000 mg | ORAL_TABLET | Freq: Every day | ORAL | 2 refills | Status: DC

## 2018-04-15 NOTE — Telephone Encounter (Signed)
Patient came by office today complaining of heavy irregular bleeding after IUD, has follow up Friday 5/3, nothing sooner to work in.   She is wanting call to advise.

## 2018-04-15 NOTE — Telephone Encounter (Signed)
Pt wants to know is there another hormone you could give her to help with this heavy bleeding, she states its messing with her iron levels and she changes her pad every 2 hours. Pt states the bleeding has not lightened up the whole month she has had the iud

## 2018-04-15 NOTE — Telephone Encounter (Signed)
Provera 20 mg daily until bleeding stops up to 10 days called in

## 2018-04-15 NOTE — Telephone Encounter (Signed)
Left message to make pt aware of rx.

## 2018-04-19 ENCOUNTER — Encounter: Payer: Self-pay | Admitting: Obstetrics & Gynecology

## 2018-04-19 ENCOUNTER — Ambulatory Visit: Payer: Medicaid Other | Admitting: Obstetrics & Gynecology

## 2018-04-19 VITALS — BP 120/80 | Ht 64.0 in | Wt 238.0 lb

## 2018-04-19 DIAGNOSIS — N921 Excessive and frequent menstruation with irregular cycle: Secondary | ICD-10-CM | POA: Diagnosis not present

## 2018-04-19 NOTE — Progress Notes (Signed)
  History of Present Illness:  Cathy Trevino is a 32 y.o. that had a Mirena IUD placed approximately 5 weeks ago. Since that time, she states that she has had some bleeding and this has been heavy at times, despite IUD (placed for menorrhagia; already has BTL for [facility]).  Provera given on Monday to help w bleeding in transition, min to no help.  PMHx: She  has a past medical history of Anemia, Anxiety, Asthma, Depression, and Menorrhagia. Also,  has a past surgical history that includes Cesarean section and Tubal ligation., family history includes Heart failure in her mother; Hypertension in her mother.,  reports that she quit smoking about 2 years ago. Her smoking use included cigarettes. She has a 1.25 pack-year smoking history. She has never used smokeless tobacco. She reports that she does not drink alcohol or use drugs. Current Meds  Medication Sig  . acetaminophen (TYLENOL) 500 MG tablet Take 500 mg by mouth every 6 (six) hours as needed.  . fluticasone (FLONASE) 50 MCG/ACT nasal spray Place into both nostrils daily.  . IRON, FERROUS GLUCONATE, PO Take by mouth.  . levonorgestrel (MIRENA) 20 MCG/24HR IUD 1 each by Intrauterine route once.  . medroxyPROGESTERone (PROVERA) 10 MG tablet Take 2 tablets (20 mg total) by mouth daily.  .  Also, is allergic to penicillins..  Review of Systems  Constitutional: Negative for chills, fever and malaise/fatigue.  HENT: Negative for congestion, sinus pain and sore throat.   Eyes: Negative for blurred vision and pain.  Respiratory: Negative for cough and wheezing.   Cardiovascular: Negative for chest pain and leg swelling.  Gastrointestinal: Negative for abdominal pain, constipation, diarrhea, heartburn, nausea and vomiting.  Genitourinary: Negative for dysuria, frequency, hematuria and urgency.  Musculoskeletal: Negative for back pain, joint pain, myalgias and neck pain.  Skin: Negative for itching and rash.  Neurological: Negative for dizziness,  tremors and weakness.  Endo/Heme/Allergies: Does not bruise/bleed easily.  Psychiatric/Behavioral: Negative for depression. The patient is not nervous/anxious and does not have insomnia.    Physical Exam:  BP 120/80   Ht  (1.626 m)   Wt 238 lb (108 kg)   BMI 40.85 kg/m  Body mass index is 40.85 kg/m. Constitutional: Well nourished, well developed female in no acute distress.  Abdomen: diffusely non tender to palpation, non distended, and no masses, hernias Neuro: Grossly intact Psych:  Normal mood and affect.   Pelvic exam:  Two IUD strings present seen coming from the cervical os. EGBUS, vaginal vault and cervix: within normal limits  Assessment: Menometrorrhagia, new problem from her menorrhagia previously diagnosed; awaiting treatment response from Mirena  IUD strings present in proper location  Plan: IUD seems to be in proper position but need to further evaluate this in addition to pelvic mass that may contribute to this bleeding. [place] soon  A total of 15 minutes were spent face-to-face with the patient during this encounter and over half of that time dealt with counseling and coordination of care.  Annamarie Major, MD, [facility], [facility] 04/19/2018  9:16 AM

## 2018-04-23 ENCOUNTER — Ambulatory Visit: Payer: Medicaid Other | Admitting: Obstetrics & Gynecology

## 2018-04-23 ENCOUNTER — Other Ambulatory Visit: Payer: Medicaid Other

## 2018-04-29 ENCOUNTER — Ambulatory Visit: Payer: Medicaid Other | Admitting: Obstetrics & Gynecology

## 2018-05-21 ENCOUNTER — Encounter: Payer: Self-pay | Admitting: Obstetrics & Gynecology

## 2018-05-21 ENCOUNTER — Ambulatory Visit: Payer: Medicaid Other | Admitting: Obstetrics & Gynecology

## 2018-05-21 VITALS — BP 100/70 | HR 82 | Ht 64.0 in | Wt 242.0 lb

## 2018-05-21 DIAGNOSIS — D500 Iron deficiency anemia secondary to blood loss (chronic): Secondary | ICD-10-CM | POA: Diagnosis not present

## 2018-05-21 DIAGNOSIS — N920 Excessive and frequent menstruation with regular cycle: Secondary | ICD-10-CM

## 2018-05-21 NOTE — Progress Notes (Signed)
  History of Present Illness:  Cathy Trevino is a 32 y.o. that had a Mirena IUD placed approximately several months ago. Since that time, she states that she has had marked improvement in her bleeding.  No pain.  PMHx: She  has a past medical history of Anemia, Anxiety, Asthma, Depression, and Menorrhagia. Also,  has a past surgical history that includes Cesarean section and Tubal ligation., family history includes Heart failure in her mother; Hypertension in her mother.,  reports that she quit smoking about 2 years ago. Her smoking use included cigarettes. She has a 1.25 pack-year smoking history. She has never used smokeless tobacco. She reports that she does not drink alcohol or use drugs. Current Meds  Medication Sig  . fluticasone (FLONASE) 50 MCG/ACT nasal spray Place into both nostrils daily.  . IRON, FERROUS GLUCONATE, PO Take by mouth.  . levonorgestrel (MIRENA) 20 MCG/24HR IUD 1 each by Intrauterine route once.  .  Also, is allergic to penicillins..  Review of Systems  All other systems reviewed and are negative.  Physical Exam:  BP 100/70   Pulse 82   Ht 5\' 4"  (1.626 m)   Wt 242 lb (109.8 kg)   BMI 41.54 kg/m  Body mass index is 41.54 kg/m. Constitutional: Well nourished, well developed female in no acute distress.  Abdomen: diffusely non tender to palpation, non distended, and no masses, hernias Neuro: Grossly intact Psych:  Normal mood and affect.    Assessment: Menorrhagia  Plan: Mirena appears to be helping with her menorrhagia at this time, improved form last visit.  Will plan to continue for now, and can consider this for long term therapy/prevention of menorrhagia.  Plan replacing every 5 years, or when BTB worsens.  She was amenable to this plan and we will see her back in 1 year/PRN.  A total of 15 minutes were spent face-to-face with the patient during this encounter and over half of that time dealt with counseling and coordination of care.  Annamarie MajorPaul Loma Dubuque, MD,  [facility]AC[facility], [facility] 05/21/2018  9:14 AM

## 2018-05-23 DIAGNOSIS — E559 Vitamin D deficiency, unspecified: Secondary | ICD-10-CM | POA: Insufficient documentation

## 2018-05-23 DIAGNOSIS — E538 Deficiency of other specified B group vitamins: Secondary | ICD-10-CM | POA: Insufficient documentation

## 2018-05-27 ENCOUNTER — Other Ambulatory Visit: Payer: Medicaid Other

## 2018-05-30 ENCOUNTER — Ambulatory Visit: Payer: Medicaid Other

## 2018-05-30 ENCOUNTER — Ambulatory Visit: Payer: Medicaid Other | Admitting: Oncology

## 2018-06-03 ENCOUNTER — Inpatient Hospital Stay: Payer: Medicaid Other | Attending: Oncology

## 2018-06-03 DIAGNOSIS — D500 Iron deficiency anemia secondary to blood loss (chronic): Secondary | ICD-10-CM | POA: Insufficient documentation

## 2018-06-03 DIAGNOSIS — N920 Excessive and frequent menstruation with regular cycle: Secondary | ICD-10-CM | POA: Diagnosis not present

## 2018-06-03 DIAGNOSIS — Z87891 Personal history of nicotine dependence: Secondary | ICD-10-CM | POA: Insufficient documentation

## 2018-06-03 DIAGNOSIS — F418 Other specified anxiety disorders: Secondary | ICD-10-CM | POA: Insufficient documentation

## 2018-06-03 DIAGNOSIS — Z79899 Other long term (current) drug therapy: Secondary | ICD-10-CM | POA: Diagnosis not present

## 2018-06-03 DIAGNOSIS — R5383 Other fatigue: Secondary | ICD-10-CM | POA: Insufficient documentation

## 2018-06-03 LAB — CBC WITH DIFFERENTIAL/PLATELET
BASOS ABS: 0.1 10*3/uL (ref 0–0.1)
Basophils Relative: 1 %
Eosinophils Absolute: 0.3 10*3/uL (ref 0–0.7)
Eosinophils Relative: 4 %
HEMATOCRIT: 42.2 % (ref 35.0–47.0)
Hemoglobin: 14.4 g/dL (ref 12.0–16.0)
LYMPHS PCT: 19 %
Lymphs Abs: 1.4 10*3/uL (ref 1.0–3.6)
MCH: 29.7 pg (ref 26.0–34.0)
MCHC: 34 g/dL (ref 32.0–36.0)
MCV: 87.5 fL (ref 80.0–100.0)
MONO ABS: 0.3 10*3/uL (ref 0.2–0.9)
Monocytes Relative: 4 %
Neutro Abs: 5.1 10*3/uL (ref 1.4–6.5)
Neutrophils Relative %: 72 %
Platelets: 189 10*3/uL (ref 150–440)
RBC: 4.83 MIL/uL (ref 3.80–5.20)
RDW: 15.2 % — ABNORMAL HIGH (ref 11.5–14.5)
WBC: 7.1 10*3/uL (ref 3.6–11.0)

## 2018-06-03 LAB — IRON AND TIBC
Iron: 120 ug/dL (ref 28–170)
Saturation Ratios: 44 % — ABNORMAL HIGH (ref 10.4–31.8)
TIBC: 276 ug/dL (ref 250–450)
UIBC: 156 ug/dL

## 2018-06-03 LAB — FERRITIN: FERRITIN: 59 ng/mL (ref 11–307)

## 2018-06-06 ENCOUNTER — Other Ambulatory Visit: Payer: Self-pay

## 2018-06-06 ENCOUNTER — Inpatient Hospital Stay (HOSPITAL_BASED_OUTPATIENT_CLINIC_OR_DEPARTMENT_OTHER): Payer: Medicaid Other | Admitting: Oncology

## 2018-06-06 ENCOUNTER — Encounter: Payer: Self-pay | Admitting: Oncology

## 2018-06-06 ENCOUNTER — Inpatient Hospital Stay: Payer: Medicaid Other

## 2018-06-06 VITALS — BP 109/75 | HR 96 | Temp 97.5°F | Resp 18 | Wt 241.4 lb

## 2018-06-06 DIAGNOSIS — N920 Excessive and frequent menstruation with regular cycle: Secondary | ICD-10-CM | POA: Diagnosis not present

## 2018-06-06 DIAGNOSIS — D500 Iron deficiency anemia secondary to blood loss (chronic): Secondary | ICD-10-CM | POA: Diagnosis not present

## 2018-06-06 DIAGNOSIS — R5383 Other fatigue: Secondary | ICD-10-CM

## 2018-06-06 DIAGNOSIS — F418 Other specified anxiety disorders: Secondary | ICD-10-CM

## 2018-06-06 DIAGNOSIS — Z79899 Other long term (current) drug therapy: Secondary | ICD-10-CM | POA: Diagnosis not present

## 2018-06-06 DIAGNOSIS — Z87891 Personal history of nicotine dependence: Secondary | ICD-10-CM | POA: Diagnosis not present

## 2018-06-06 NOTE — Progress Notes (Signed)
Patient here today for follow up. No concerns.  °

## 2018-06-06 NOTE — Progress Notes (Signed)
Hematology/Oncology Follow Up Note [facility] [phone] [phone]   Patient Care Team: Fayrene Helper, NP as PCP - General (Nurse Practitioner)  REFERRING PROVIDER: Fayrene Helper, NP CHIEF COMPLAINTS/PURPOSE OF CONSULTATION:  Evaluation of anemia  HISTORY OF PRESENTING ILLNESS:  Cathy Trevino is a  32 y.o.  female with PMH listed below who was referred to me for evaluation of anemia.  Patient reports feeling fatigue, lack of energy.  She also reports feeling cold all the time.  Her primary care physician has checked her thyroid function which was normal.  Reports having irregular heavy menstrual..  She has an appointment to see GYN.  She has been taking iron supplementation orally for the past year.  Despite that her hemoglobin remains low. Recently she has had labs done with her primary care physician.  Medical record review was performed. She has lab done on February 08, 2018, CBC showed hemoglobin 9.9, MCV 73, white count 7.1, platelet counts 248,000.  TSH 0.9, Patient is a single mom, she has 3 kids.   INTERVAL HISTORY Cathy Trevino is a 32 y.o. female who has above history reviewed by me today presents for follow up visit for management of iron deficiency anemia. During the interval patient reports feeling well.  She has been seen by GYN and has been started on IUD.  Fatigue is much better.   Review of Systems  Constitutional: Negative for chills, fever, malaise/fatigue and weight loss.  HENT: Negative for congestion, ear discharge, ear pain, hearing loss, nosebleeds, sinus pain, sore throat and tinnitus.   Eyes: Negative for double vision, photophobia, pain, discharge and redness.  Respiratory: Negative for cough, hemoptysis, sputum production, shortness of breath and wheezing.   Cardiovascular: Negative for chest pain, palpitations, orthopnea, claudication and leg swelling.  Gastrointestinal: Negative for abdominal pain,  blood in stool, constipation, diarrhea, heartburn, melena, nausea and vomiting.  Genitourinary: Negative for dysuria, flank pain, frequency, hematuria and urgency.       Heavy menstrual period   Musculoskeletal: Negative for back pain, myalgias and neck pain.  Skin: Negative for itching and rash.  Neurological: Negative for dizziness, tingling, tremors, sensory change, speech change, focal weakness, weakness and headaches.  Endo/Heme/Allergies: Negative for environmental allergies. Does not bruise/bleed easily.  Psychiatric/Behavioral: Negative for depression, hallucinations and substance abuse. The patient is not nervous/anxious.     MEDICAL HISTORY:  Past Medical History:  Diagnosis Date  . Anemia   . Anxiety   . Asthma   . Depression   . Menorrhagia     SURGICAL HISTORY: Past Surgical History:  Procedure Laterality Date  . CESAREAN SECTION    . TUBAL LIGATION      SOCIAL HISTORY: Social History   Socioeconomic History  . Marital status: Single    Spouse name: Not on file  . Number of children: Not on file  . Years of education: Not on file  . Highest education level: Not on file  Occupational History  . Not on file  Social Needs  . Financial resource strain: Not on file  . Food insecurity:    Worry: Not on file    Inability: Not on file  . Transportation needs:    Medical: Not on file    Non-medical: Not on file  Tobacco Use  . Smoking status: Former Smoker    Packs/day: 0.25    Years: 5.00    Pack years: 1.25    Types: Cigarettes    Last attempt  to quit: 02/22/2016    Years since quitting: 2.2  . Smokeless tobacco: Never Used  Substance and Sexual Activity  . Alcohol use: No  . Drug use: No  . Sexual activity: Yes    Birth control/protection: IUD  Lifestyle  . Physical activity:    Days per week: Not on file    Minutes per session: Not on file  . Stress: Not on file  Relationships  . Social connections:    Talks on phone: Not on file    Gets  together: Not on file    Attends religious service: Not on file    Active member of club or organization: Not on file    Attends meetings of clubs or organizations: Not on file    Relationship status: Not on file  . Intimate partner violence:    Fear of current or ex partner: Not on file    Emotionally abused: Not on file    Physically abused: Not on file    Forced sexual activity: Not on file  Other Topics Concern  . Not on file  Social History Narrative  . Not on file    FAMILY HISTORY: Family History  Problem Relation Age of Onset  . Heart failure Mother   . Hypertension Mother     ALLERGIES:  is allergic to penicillins.  MEDICATIONS:  Current Outpatient Medications  Medication Sig Dispense Refill  . acetaminophen (TYLENOL) 500 MG tablet Take 500 mg by mouth every 6 (six) hours as needed.    . fluticasone (FLONASE) 50 MCG/ACT nasal spray Place into both nostrils daily.    . IRON, FERROUS GLUCONATE, PO Take by mouth.    . levonorgestrel (MIRENA) 20 MCG/24HR IUD 1 each by Intrauterine route once.    . loratadine (CLARITIN) 10 MG tablet Take 1 tablet (10 mg total) by mouth daily. 30 tablet 2  . medroxyPROGESTERone (PROVERA) 10 MG tablet Take 2 tablets (20 mg total) by mouth daily. (Patient not taking: Reported on 05/21/2018) 20 tablet 2   No current facility-administered medications for this visit.      PHYSICAL EXAMINATION: ECOG PERFORMANCE STATUS: 1 - Symptomatic but completely ambulatory Vitals:   06/06/18 0847  BP: 109/75  Pulse: 96  Resp: 18  Temp: (!) 97.5 F (36.4 C)  SpO2: 100%   Filed Weights   06/06/18 0847  Weight: 241 lb 6.5 oz (109.5 kg)    Physical Exam  Constitutional: She is oriented to person, place, and time and well-developed, well-nourished, and in no distress. No distress.  HENT:  Head: Normocephalic and atraumatic.  Nose: Nose normal.  Mouth/Throat: Oropharynx is clear and moist. No oropharyngeal exudate.  Eyes: Pupils are equal, round,  and reactive to light. EOM are normal. Left eye exhibits no discharge. No scleral icterus.  Neck: Normal range of motion. Neck supple. No JVD present.  Cardiovascular: Normal rate, regular rhythm and normal heart sounds.  No murmur heard. Pulmonary/Chest: Effort normal and breath sounds normal. No respiratory distress. She has no wheezes. She has no rales. She exhibits no tenderness.  Abdominal: Soft. Bowel sounds are normal. She exhibits no distension and no mass. There is no tenderness. There is no rebound.  Musculoskeletal: Normal range of motion. She exhibits no edema or tenderness.  Lymphadenopathy:    She has no cervical adenopathy.  Neurological: She is alert and oriented to person, place, and time. No cranial nerve deficit. She exhibits normal muscle tone. Coordination normal.  Skin: Skin is warm and dry. No  rash noted. She is not diaphoretic. No erythema.  Psychiatric: Memory, affect and judgment normal.     LABORATORY DATA:  I have reviewed the data as listed Lab Results  Component Value Date   WBC 7.1 06/03/2018   HGB 14.4 06/03/2018   HCT 42.2 06/03/2018   MCV 87.5 06/03/2018   PLT 189 06/03/2018   Recent Labs    11/27/17 1644  NA 138  K 3.6  CL 103  CO2 26  GLUCOSE 106*  BUN 8  CREATININE 0.62  CALCIUM 9.1  GFRNONAA >60  GFRAA >60  PROT 8.1  ALBUMIN 3.8  AST 17  ALT 12*  ALKPHOS 61  BILITOT 0.5       ASSESSMENT & PLAN:  1. Iron deficiency anemia due to chronic blood loss    #Status post IV Feraheme x3.  Iron panel reviewed.  Significantly improved iron stores.  Hold additional IV iron.  Hemoglobin has also normalized. Discussed with patient that since menorrhagia which is the bleeding source has been treated, most likely her iron store will remain stable. I recommend her to follow-up with me annually for repeat iron testing and assessment.  She voices understanding agree with the plan.  All questions were answered. The patient knows to call the  clinic with any problems questions or concerns.  Return of visit: 1 year..  Total face to face encounter time for this patient visit was 15 min. >50% of the time was  spent in counseling and coordination of care.    Rickard Patience, MD, PhD Hematology Oncology [facility] Pager- [phone] 06/06/2018

## 2018-07-03 ENCOUNTER — Other Ambulatory Visit: Payer: Self-pay | Admitting: Nurse Practitioner

## 2018-07-03 DIAGNOSIS — M5440 Lumbago with sciatica, unspecified side: Secondary | ICD-10-CM

## 2018-07-15 ENCOUNTER — Ambulatory Visit
Admission: RE | Admit: 2018-07-15 | Discharge: 2018-07-15 | Disposition: A | Discharge status: Home / Self Care | Payer: Medicaid Other | Source: Ambulatory Visit | Attending: Nurse Practitioner | Admitting: Nurse Practitioner

## 2018-07-15 ENCOUNTER — Ambulatory Visit: Payer: Medicaid Other

## 2018-07-15 DIAGNOSIS — M5117 Intervertebral disc disorders with radiculopathy, lumbosacral region: Secondary | ICD-10-CM | POA: Insufficient documentation

## 2018-07-15 DIAGNOSIS — M5440 Lumbago with sciatica, unspecified side: Secondary | ICD-10-CM | POA: Diagnosis present

## 2018-07-15 DIAGNOSIS — D649 Anemia, unspecified: Secondary | ICD-10-CM | POA: Insufficient documentation

## 2018-07-15 MED ORDER — GADOBENATE DIMEGLUMINE 529 MG/ML IV SOLN
20.0000 mL | Freq: Once | INTRAVENOUS | Status: AC | PRN
  Administered 2018-07-15: 20 mL via INTRAVENOUS

## 2018-10-17 DIAGNOSIS — E785 Hyperlipidemia, unspecified: Secondary | ICD-10-CM | POA: Insufficient documentation

## 2018-10-17 DIAGNOSIS — M5126 Other intervertebral disc displacement, lumbar region: Secondary | ICD-10-CM | POA: Insufficient documentation

## 2018-11-04 ENCOUNTER — Other Ambulatory Visit: Payer: Self-pay

## 2018-11-04 ENCOUNTER — Encounter: Payer: Self-pay | Admitting: Physical Therapy

## 2018-11-04 ENCOUNTER — Ambulatory Visit: Payer: Medicaid Other | Attending: Nurse Practitioner | Admitting: Physical Therapy

## 2018-11-04 DIAGNOSIS — M545 Low back pain, unspecified: Secondary | ICD-10-CM

## 2018-11-04 DIAGNOSIS — G8929 Other chronic pain: Secondary | ICD-10-CM | POA: Diagnosis present

## 2018-11-04 DIAGNOSIS — R262 Difficulty in walking, not elsewhere classified: Secondary | ICD-10-CM | POA: Insufficient documentation

## 2018-11-04 NOTE — Therapy (Signed)
[facility] PHYSICAL AND SPORTS MEDICINE [address] [place], [place], [address] Phone: [phone]   Fax:  [phone]  Physical Therapy Evaluation  Patient Details  Name: Cathy Trevino MRN: [id number] Date of Birth: 02-28-1986 Referring Provider (PT): Meryl Crutchhelsea Boswell, NP-C   Encounter Date: 11/04/2018  PT End of Session - 11/05/18 1109    Visit Number  1    Number of Visits  13    Date for PT Re-Evaluation  12/16/18    Authorization Type  Medicaid    Authorization Time Period  Current Certification period: 11/04/2018 - 12/16/2018    Authorization - Visit Number  1    Authorization - Number of Visits  4    PT Start Time  1430    PT Stop Time  1530    PT Time Calculation (min)  60 min    Activity Tolerance  Patient tolerated treatment well;Patient limited by pain;Patient limited by fatigue    Behavior During Therapy  [facility] tasks assessed/performed       Past Medical History:  Diagnosis Date  . Anemia   . Anxiety   . Asthma   . Depression   . Menorrhagia     Past Surgical History:  Procedure Laterality Date  . CESAREAN SECTION  2012  . TUBAL LIGATION     SUBJECTIVE: HISTORY:  Patient is a 32 y.o. female who presents to outpatient physical therapy with a referral for chronic low back pain. This patient's chief complaints consist of pain, leading to the following functional deficits: difficulty with all activities that require movement or motion including lifting, bending, prolonged positions, sleeping. Relevant past medical history and comorbidities include "musculoskeletal issue around heart" told to take tylenol when needed (sees cardiologist when he feels is necessary, last was July 2018), obesity, previous episodes of low back pain, history of depression and anxiety, anemia.  Imaging: From MRI report dated 07/15/2018: "1. Central/left subarticular disc protrusion at L5-S1 extending into the left lateral recess and impinging upon  the descending left S1 nerve root. 2. Diffusely decreased T1 signal throughout the visualized bone marrow, nonspecific, but suspected to be related to patient's history of anemia. 3. Otherwise normal MRI of the lumbar spine." Response to previously administered skilled services: tylenol, was taking muscle relaxer (barely touched the pain).   Patient states condition started when she had back pain about 1-1.5 year ago for no apparent reason and [facility] thought it was a kidney stone and it eventually completely went away. Now it is back about 4 months ago without identifiable injury. Patient feels she has limited mobility due to very painful back that t causes her difficulty whenever she moves. She feels she cannot sit for too long, reach too far, standing.   FUNCTION Patient-reported Outcome Measure: FOTO = 47 Work: does not work. Hobbies: take care of two children with autism (7 and 3510) and daughter with ADHD 64(9 yo).  PLOF: unlimited by back pain.  Current Level of Function: limited in daily activities and responsibilities, limited to 5# of lifting, disrupts sleep.  Restrictions: neurologist put her on 5# weight lift limit.   PAIN Location: low back, right > left constant with varying intensity.  Nature: numbness, sharp Pain Scale:  Patient reports current pain as 10/10, at best 2/10, at worst 10/10, and during functional activity 10/10. Paresthesia: denies. Aggravating factors: any position too long, getting up in the morning, sitting is worse than standing, bending, lifting Easing factors: heating pad, 3-sided  pillows when having to sit for too long.  24-hour pattern: worst in the morning  SPECIAL SCREENING QUESTIONS Dizziness, double vision, difficulty swallowing, difficulty speaking, fainting spells or blackouts, facial numbness, or nausea: denies. Recent illness or fever: denies. Recent unexplained weight loss: denies. Night pain/sweats: denies Recent bowel or bladder function  abnormality: denies. Current symptoms/concerns not elsewhere described: denies. Denies long term steroid medication use.  Denies spinal surgery.   There were no vitals filed for this visit.   Subjective Assessment - 11/04/18 1440    Subjective  Patient states condition started when she had back pain about 1-1.5 year ago for no apparent reason and [facility] thought it was a kidney stone and it eventually completely went away. Now it is back about 4 months ago without identifiable injury. Patient feels she has limited mobility due to very painful back that t causes her difficulty whenever she moves. She feels she cannot sit for too long, reach too far, standing    Pertinent History  Patient is a 32 y.o. female who presents to outpatient physical therapy with a referral for chronic low back pain. This patient's chief complaints consist of pain, leading to the following functional deficits: difficulty with all activities that require movement or motion including lifting, bending, prolonged positions, sleeping. Relevant past medical history and comorbidities include "musculoskeletal issue around heart" told to take tylenol when needed (sees cardiologist when he feels is necessary, last was July 2018), obesity, previous episodes of low back pain, history of depression and anxiety, anemia. Imaging: From MRI report dated 07/15/2018: "1. 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 2. Diffusely decreased T1 signal throughout the visualized bone marrow, nonspecific, but suspected to be related to patient's history of anemia. 3. Otherwise normal MRI of the lumbar spine."    How long can you sit comfortably?  pain as soon as she sits    How long can you stand comfortably?  10-12 min    How long can you walk comfortably?  5-10 min    Diagnostic tests  MRI report dated 07/15/2018: "1. Central/left subarticular disc protrusion  at L5-S1 extending into the left lateral recess and impinging upon the descending left S1 nerve root. 2. Diffusely decreased T1 signal throughout the visualized bone marrow, nonspecific, but suspected to be related to patient's history of anemia. 3. Otherwise normal MRI of the lumbar spine."    Patient Stated Goals  Get rid of the pain so she can function better.     Currently in Pain?  Yes    Pain Score  10-Worst pain ever    Pain Location  Back    Pain Orientation  Right;Lower;Left;Mid    Pain Descriptors / Indicators  Sharp;Numbness    Pain Type  Chronic pain    Pain Radiating Towards  right side more than left. stays in low back    Pain Onset  More than a month ago    Pain Frequency  Constant    Aggravating Factors   any position too long, getting up in the morning, sitting is worse than standing, bending, lifting    Pain Relieving Factors  heating pad, 3-sided pillows when having to sit for too long.     Effect of Pain on Daily Activities  makes everything more difficulty including ADLs, IADLs, caring for familiy, participating in usual activities.          [facility] PT Assessment -  11/05/18 0001      Assessment   Medical Diagnosis  Chronic low back pai    Referring Provider (PT)  Meryl Crutch, NP-C    Onset Date/Surgical Date  07/05/18    Next MD Visit  01/16/2018    Prior Therapy  no      Precautions   Precautions  Other (comment)   no lifting over 5#     Restrictions   Weight Bearing Restrictions  No    Other Position/Activity Restrictions  no lifting over 5#      Balance Screen   Has the patient fallen in the past 6 months  No    Has the patient had a decrease in activity level because of a fear of falling?   Yes    Is the patient reluctant to leave their home because of a fear of falling?   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        OBJECTIVE: OBSERVATION/INSPECTION: Patient presents with obesity, slight left shift. Marland Kitchen  NEUROLOGICAL: Dermatomes: B=WNL Myotomes: B = WNL except possible slightly decreased right knee flexion (C2). Reflexes: . - Quadriceps reflex (L4): B = absent (pt with difficulty relaxing) - Achilles reflex (S1): R = absent (difficulty relaxing), L = 2+ Upper Motor Neuron Screen: Hoffman's, and Clonus (ankle) negative bilaterally.   SPINE MOTION Lumbar AROM:  *Indicates pain - Flexion: = ankles, 75% (reports normal range, ERP, painful return). - Extension: = 50% ERP. - Rotation: R = 100% ERP, L = 100% ERP - Side Flexion: R = just proximal to patella, L = 3 inches proximal to patella. Marland Kitchen  PERIPHERAL JOINT MOTION (AROM/PROM in degrees):  *Indicates pain BLE grossly WFL except hip extension limited bilaterally and  some difficulty due to pain with left hip motion. Specific testing deferred to later date.   STRENGTH:  *Indicates pain Hip  - Flexion: R = 3+/5, L = 4/5. - Abduction: R = 3+/5 painful , L = 4+/5 painful!. Knee - Ext: R = 5/5, L = 5/5. - Flex: R = 4/5, L = 5/5. Ankle (seated position) - Dorsiflexion: R = 5/5, L = 5/5. - Plantarflexion: R = 5/5, L = 5/5.  - Eversion: R = 5/5, L = 5/5. - Great toe extension: R = 5/5, L = 5/5  REPEATED MOTIONS TESTING: - Standing repeated lumbar extension x 10: during = ERP ; after = increased soreness but not obviously worse.  - prone lumbar extension (prone press up) x 10: no effect overall.  SPECIAL TESTS: Straight leg raise (SLR): R = positive (right back pain), L = positive for right low back pain (positive crossed SLR for right sided pain).  FABER: B = negative for hip pathology  ACCESSORY  MOTION:  - CPA at mid thoracic and upper lumbar spine most tender.  - CPA at base of spine over L5 mildly tender.   FUNCTIONAL MOBILITY: - Bed mobility: supine <> sit I, slow due to pain.  - Transfers: sit <> stand I slow due to discomfort.  -  Gait: WFL for indoor distances. - Stairs: not tested.   Objective measurements completed on examination: See above findings.   TREATMENT:  Denies latex allergy.   Therapeutic exercise: to centralize symptoms and improve ROM and strength required for successful completion of functional activities.  - prone lumbar extension (prone press up) x 10: no effect overall. - trial of postural correction with lumbar roll and education on how to use throughout day. -Education on diagnosis, prognosis, POC, anatomy and physiology of current condition.  - Education on HEP including handout    HOME EXERCISE PROGRAM Access Code: [facility]  URL: [email or web address]  Date: 11/05/2018  Prepared by: Norton Blizzard   Exercises  Prone Press Up - 10-15 reps - 1 second hold - 4x daily  Seated Correct Posture   Patient response to treatment:  Pt tolerated treatment well. Pt was able to complete all exercises with minimal to no lasting increase in pain or discomfort. Pt required cuing for proper technique and to facilitate improved neuromuscular control, strength, range of motion, and functional ability.     PT Education - 11/05/18 1106    Education Details  -Education on diagnosis, prognosis, POC, anatomy and physiology of current condition. - Education on HEP including handout      Person(s) Educated  Patient    Methods  Explanation;Demonstration;Handout;Verbal cues    Comprehension  Verbalized understanding;Returned demonstration       PT Short Term Goals - 11/05/18 1111      PT SHORT TERM GOAL #1   Title  Be independent with home exercise program completed at least 3 times per week for self-management of symptoms.    Baseline  Initial HEP  provided at initial eval (11/04/2018);     Time  2    Period  Weeks    Status  New    Target Date  11/19/18      PT SHORT TERM GOAL #2   Title  Reduce pain to 8/10 with functional activities to allow patient to complete valued functional tasks such as sit to stand with less difficulty.    Baseline  reports 10/10 at initial evaluation (11/04/2018);     Time  2    Period  Weeks    Status  New    Target Date  11/19/18      PT SHORT TERM GOAL #3   Title  Improve lumbar AROM to 75% to allow patient to complete valued activities with less difficulty.     Baseline  50% with end range pain at initial evaluaiton (11/04/2018);     Time  2    Period  Weeks    Status  New    Target Date  11/19/18        PT Long Term Goals - 11/05/18 1212      PT LONG TERM GOAL #1   Title  Be independent with a long-term home exercise program for self-management of symptoms.     Baseline  Initial HEP provided at initial eval (11/04/2018);    Time  6    Period  Weeks    Status  New    Target Date  12/16/18      PT LONG TERM GOAL #2   Title  Demonstrate improved FOTO score by 10 units to demonstrate improvement in overall condition and self-reported functional ability.     Baseline  Foto = 47 (11/04/2018);    Time  6    Period  Weeks    Status  New  Target Date  12/16/18      PT LONG TERM GOAL #3   Title  Complete community, work and/or recreational activities without limitation due to current condition.     Baseline   limited in daily activities and responsibilities, limited to 5# of lifting, disrupts sleep. 5# weight lift limit (11/04/2018);    Time  6    Period  Weeks    Status  New    Target Date  12/16/18      PT LONG TERM GOAL #4   Title  Patient will increase BLE gross strength to 4+/5 as to improve functional strength for independent gait, increased standing tolerance and increased ADL ability.    Baseline  Hip: Abduction: R = 3+/5 painful , L = 4+/5 painful!. Hip Flexion: R = 3+/5, L  = 4/5. Knee Flex: R = 4/5, L = 5/5. (11/04/2018);    Time  6    Period  Weeks    Status  New    Target Date  12/16/18      PT LONG TERM GOAL #5   Title  Be able to squat to chair height with proper form without limitation due to current condition in order to improve ability to lift and complete transfers during usual activities.     Baseline  Difficulty with sit<>stand transfers due to pain.     Time  6    Period  Weeks    Status  New    Target Date  12/16/18             Plan - 11/05/18 1100    Clinical Impression Statement  Patient is a 32 y.o. female referred to outpatient physical therapy with a diagnosis of low back pain who presents with signs and symptoms consistent with chronic low back pain. Provisional classification lumbar derangement with extension preference vs Other: chronic pain. Will continue to assess and confirm or abandon provisional classification at future visits.    History and Personal Factors relevant to plan of care:  reports high severity of pain despite generally relaxed/calm/happy demeanor. Reports "musculoskeletal issue around heart" told to take tylenol when needed (sees cardiologist when he feels is necessary, last was July 2018), obesity, previous episodes of low back pain, history of depression and anxiety, anemia.    Clinical Presentation  Evolving    Clinical Presentation due to:  patient's condition fluxuates    Clinical Decision Making  Moderate    Rehab Potential  Good    Clinical Impairments Affecting Rehab Potential  (+) denies leg symptoms; (-) apparent subjective pain exaggeration, external locus of control, focus on pain.     PT Frequency  2x / week    PT Duration  6 weeks    PT Treatment/Interventions  ADLs/Self Care Home Management;Moist Heat;Cryotherapy;Functional mobility training;Stair training;Therapeutic activities;Therapeutic exercise;Balance training;Neuromuscular re-education;Patient/family education;Manual techniques;Passive range  of motion;Dry needling;Spinal Manipulations;Joint Manipulations;Other (comment)   joint mobilizations grades I-V   PT Next Visit Plan  Assess response to HEP, progress strengthening and stretching and specific exercise as tolerated/appropriate.     PT Home Exercise Plan  Med[employer]  Access Code: [email or web address]    Consulted and Agree with Plan of Care  Patient      ASSESSMENT:  Patient is a 32 y.o. female referred to outpatient physical therapy with a diagnosis of low back pain who presents with signs and symptoms consistent with chronic low back pain. Provisional classification lumbar derangement with extension preference vs Other: chronic pain. Will continue to assess  and confirm or abandon provisional classification at future visits.  Patient will benefit from skilled therapeutic intervention in order to improve the following deficits and impairments:  Decreased endurance, Decreased mobility, Difficulty walking, Hypomobility, Increased muscle spasms, Decreased range of motion, Impaired perceived functional ability, Impaired tone, Obesity, Decreased activity tolerance, Decreased strength, Postural dysfunction, Pain, Impaired flexibility  Visit Diagnosis: Chronic bilateral low back pain without sciatica  Difficulty in walking, not elsewhere classified     Problem List Patient Active Problem List   Diagnosis Date Noted  . Menorrhagia with regular cycle 03/11/2018  . Microcytic anemia 02/25/2018  . Iron deficiency anemia due to chronic blood loss 02/18/2018    Cira Rue, PT, DPT 11/05/2018, 12:20 PM  [facility] PHYSICAL AND SPORTS MEDICINE [address] [place], [address] Phone: [phone]   Fax:  [phone]  Name: RAYLYNN HERSH MRN: [id number] Date of Birth: October 29, 1986

## 2018-11-05 ENCOUNTER — Encounter: Payer: Self-pay | Admitting: Physical Therapy

## 2018-11-13 ENCOUNTER — Ambulatory Visit: Payer: Medicaid Other | Admitting: Physical Therapy

## 2018-11-18 ENCOUNTER — Encounter: Payer: Self-pay | Admitting: Physical Therapy

## 2018-11-18 ENCOUNTER — Ambulatory Visit: Payer: Medicaid Other | Attending: Nurse Practitioner | Admitting: Physical Therapy

## 2018-11-18 DIAGNOSIS — G8929 Other chronic pain: Secondary | ICD-10-CM | POA: Diagnosis present

## 2018-11-18 DIAGNOSIS — R262 Difficulty in walking, not elsewhere classified: Secondary | ICD-10-CM | POA: Insufficient documentation

## 2018-11-18 DIAGNOSIS — M545 Low back pain: Secondary | ICD-10-CM | POA: Diagnosis present

## 2018-11-18 NOTE — Therapy (Signed)
[facility] [address] [place], [address] Phone: [phone]   Fax:  [phone]  Physical Therapy Treatment  Patient Details  Name: Cathy Trevino MRN: [id number] Date of Birth: 07-01-1986 Referring Provider (PT): Meryl Crutch, NP-C   Encounter Date: 11/18/2018  PT End of Session - 11/18/18 1523    Visit Number  2    Number of Visits  13    Date for PT Re-Evaluation  12/16/18    Authorization Type  Medicaid    Authorization Time Period  Current Certification period: 11/04/2018 - 12/16/2018    Authorization - Visit Number  2    Authorization - Number of Visits  4    PT Start Time  1350    PT Stop Time  1435    PT Time Calculation (min)  45 min    Activity Tolerance  Patient tolerated treatment well;Patient limited by pain;Patient limited by fatigue    Behavior During Therapy  [facility] for tasks assessed/performed       Past Medical History:  Diagnosis Date  . Anemia   . Anxiety   . Asthma   . Depression   . Menorrhagia     Past Surgical History:  Procedure Laterality Date  . CESAREAN SECTION  2012  . TUBAL LIGATION      There were no vitals filed for this visit.  Subjective Assessment - 11/18/18 1355    Subjective  Patient reports 7/10 pain in the right low back upon arrival. States she has been doing her HEP regularly without any significant change in symptoms. She had to cancel last visit due to left foot pain at the dorsal surface of left lateral metatarsal region. She states she dropped something heavy there a couple of years ago and it only hurts when she is weight bearing. She does not feel it correlates with her back pain.     Pertinent History  Patient is a 32 y.o. female who presents to outpatient physical therapy with a referral for chronic low back pain. This patient's chief complaints consist of pain, leading to the following functional deficits: difficulty with all activities that  require movement or motion including lifting, bending, prolonged positions, sleeping. Relevant past medical history and comorbidities include "musculoskeletal issue around heart" told to take tylenol when needed (sees cardiologist when he feels is necessary, last was July 2018), obesity, previous episodes of low back pain, history of depression and anxiety, anemia. Imaging: From MRI report dated 07/15/2018: "1. 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 2. Diffusely decreased T1 signal throughout the visualized bone marrow, nonspecific, but suspected to be related to patient's history of anemia. 3. Otherwise normal MRI of the lumbar spine."    How long can you sit comfortably?  pain as soon as she sits    How long can you stand comfortably?  10-12 min    How long can you walk comfortably?  5-10 min    Diagnostic tests  MRI report dated 07/15/2018: "1. 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 2. Diffusely decreased T1 signal throughout the visualized bone marrow, nonspecific, but suspected to be related to patient's history of anemia. 3. Otherwise normal MRI of the lumbar spine."    Patient Stated Goals  Get rid of the pain so she can function  better.     Currently in Pain?  Yes    Pain Score  7     Pain Location  Back    Pain Orientation  Right;Lower;Mid    Pain Descriptors / Indicators  Numbness;Sharp    Pain Onset  More than a month ago    Pain Frequency  Constant        PT Education - 11/18/18 1523    Education Details  cuing for form and purpose of exercises/activities    Person(s) Educated  Patient    Methods  Explanation;Demonstration;Tactile cues;Verbal cues    Comprehension  Verbalized understanding;Returned demonstration     TREATMENT:  Denies latex allergy.   Therapeutic exercise:  to centralize symptoms and improve ROM and strength required for successful completion of functional activities.  - prone lumbar extension (prone press up) 2x10 flat, 4x15 with head of bed elevated.  - Standing rows with scapular retraction for improved postural and shoulder girdle strengthening and mobility. Required instruction for technique and cuing to retract, posteriorly tilt, and depress scapulae. 2x10, 15# discontinued standing exercises after due to increased left foot pain in standing.  - Prone hip extension with knee flexed to preferentially bias gluteus maximus. X 10 on left. Attempted right but discontinued due to increased lumbar pain.   -Education on diagnosis, prognosis, POC, anatomy and physiology of current condition.   Manual therapy: to reduce pain and tissue tension, improve range of motion, neuromodulation, in order to promote improved ability to complete functional activities. - prone CPA and right UPA grade III to lower lumbar segments focusing on levels of most discomfort. Pt reported minimal change with continued repetitions but had much easier time performing prone press ups following.  - prone press up with hips off center to left with clinician lateral overpressure x 10. Painful, no worse.  - prone press ups with PA clinician overpressure at tender segments of lumbar spine x 10. Painful, no worse.    HOME EXERCISE PROGRAM Access Code: [facility]  URL: [email or web address]  Date: 11/05/2018  Prepared by: Norton Blizzard   Exercises   Prone Press Up - 10-15 reps - 1 second hold - 4x daily   Seated Correct Posture   Patient response to treatment:  Pt tolerated treatment fair. She was unable to successfully perform standing exercises due to left foot pain that did not appear related or correlated with low back pain. She fount most interventions uncomfortable but was no worse following and actually had increased ROM and less difficulty standing up from  plinth after specific exercises and manual techniques. Prone hip extension and bridges aggravated right sided low back pain and were discontinued shortly. Pt reported feeling no better or worse by end of session. Pt required multimodal cuing for proper technique and to facilitate improved neuromuscular control, strength, range of motion, and functional ability with improved technique by end of session.   PT Short Term Goals - 11/05/18 1111      PT SHORT TERM GOAL #1   Title  Be independent with home exercise program completed at least 3 times per week for self-management of symptoms.    Baseline  Initial HEP provided at initial eval (11/04/2018);     Time  2    Period  Weeks    Status  New    Target Date  11/19/18      PT SHORT TERM GOAL #2   Title  Reduce pain to 8/10 with functional activities to allow patient to complete valued functional  tasks such as sit to stand with less difficulty.    Baseline  reports 10/10 at initial evaluation (11/04/2018);     Time  2    Period  Weeks    Status  New    Target Date  11/19/18      PT SHORT TERM GOAL #3   Title  Improve lumbar AROM to 75% to allow patient to complete valued activities with less difficulty.     Baseline  50% with end range pain at initial evaluaiton (11/04/2018);     Time  2    Period  Weeks    Status  New    Target Date  11/19/18        PT Long Term Goals - 11/05/18 1212      PT LONG TERM GOAL #1   Title  Be independent with a long-term home exercise program for self-management of symptoms.     Baseline  Initial HEP provided at initial eval (11/04/2018);    Time  6    Period  Weeks    Status  New    Target Date  12/16/18      PT LONG TERM GOAL #2   Title  Demonstrate improved FOTO score by 10 units to demonstrate improvement in overall condition and self-reported functional ability.     Baseline  Foto = 47 (11/04/2018);    Time  6    Period  Weeks    Status  New    Target Date  12/16/18      PT LONG TERM GOAL  #3   Title  Complete community, work and/or recreational activities without limitation due to current condition.     Baseline   limited in daily activities and responsibilities, limited to 5# of lifting, disrupts sleep. 5# weight lift limit (11/04/2018);    Time  6    Period  Weeks    Status  New    Target Date  12/16/18      PT LONG TERM GOAL #4   Title  Patient will increase BLE gross strength to 4+/5 as to improve functional strength for independent gait, increased standing tolerance and increased ADL ability.    Baseline  Hip: Abduction: R = 3+/5 painful , L = 4+/5 painful!. Hip Flexion: R = 3+/5, L = 4/5. Knee Flex: R = 4/5, L = 5/5. (11/04/2018);    Time  6    Period  Weeks    Status  New    Target Date  12/16/18      PT LONG TERM GOAL #5   Title  Be able to squat to chair height with proper form without limitation due to current condition in order to improve ability to lift and complete transfers during usual activities.     Baseline  Difficulty with sit<>stand transfers due to pain.     Time  6    Period  Weeks    Status  New    Target Date  12/16/18            Plan - 11/18/18 1526    Clinical Impression Statement  Pt tolerated treatment fair. She was unable to successfully perform standing exercises due to left foot pain that did not appear related or correlated with low back pain. She fount most interventions uncomfortable but was no worse following and actually had increased ROM and less difficulty standing up from plinth after specific exercises and manual techniques. Prone hip extension and bridges aggravated right sided  low back pain and were discontinued shortly. Pt reported feeling no better or worse by end of session. Pt required multimodal cuing for proper technique and to facilitate improved neuromuscular control, strength, range of motion, and functional ability with improved technique by end of session.    Rehab Potential  Good    Clinical Impairments Affecting  Rehab Potential  (+) denies leg symptoms; (-) apparent subjective pain exaggeration, external locus of control, focus on pain.     PT Frequency  2x / week    PT Duration  6 weeks    PT Treatment/Interventions  ADLs/Self Care Home Management;Moist Heat;Cryotherapy;Functional mobility training;Stair training;Therapeutic activities;Therapeutic exercise;Balance training;Neuromuscular re-education;Patient/family education;Manual techniques;Passive range of motion;Dry needling;Spinal Manipulations;Joint Manipulations;Other (comment)   joint mobilizations grades I-V   PT Next Visit Plan  progress strengthening and stretching and specific exercise as tolerated/appropriate.     PT Home Exercise Plan  Med[employer]  Access Code: [email or web address]    Consulted and Agree with Plan of Care  Patient       Patient will benefit from skilled therapeutic intervention in order to improve the following deficits and impairments:  Decreased endurance, Decreased mobility, Difficulty walking, Hypomobility, Increased muscle spasms, Decreased range of motion, Impaired perceived functional ability, Impaired tone, Obesity, Decreased activity tolerance, Decreased strength, Postural dysfunction, Pain, Impaired flexibility  Visit Diagnosis: Chronic bilateral low back pain without sciatica  Difficulty in walking, not elsewhere classified     Problem List Patient Active Problem List   Diagnosis Date Noted  . Menorrhagia with regular cycle 03/11/2018  . Microcytic anemia 02/25/2018  . Iron deficiency anemia due to chronic blood loss 02/18/2018    Cira RueSara R Snyder, PT, DPT 11/18/2018, 3:30 PM  [facility] [address] [place], [place], [address] Phone: [phone]   Fax:  [phone]  Name: Penni BombardJennifer M Duey MRN: [id number] Date of Birth: [birth year]

## 2018-11-20 ENCOUNTER — Encounter: Payer: Medicaid Other | Admitting: Physical Therapy

## 2018-11-25 ENCOUNTER — Encounter: Payer: Self-pay | Admitting: Physical Therapy

## 2018-11-25 ENCOUNTER — Ambulatory Visit: Payer: Medicaid Other | Admitting: Physical Therapy

## 2018-11-25 DIAGNOSIS — R262 Difficulty in walking, not elsewhere classified: Secondary | ICD-10-CM

## 2018-11-25 DIAGNOSIS — G8929 Other chronic pain: Secondary | ICD-10-CM

## 2018-11-25 DIAGNOSIS — M545 Low back pain: Secondary | ICD-10-CM | POA: Diagnosis not present

## 2018-11-25 NOTE — Therapy (Signed)
[facility]IC[facility] [address] [place], [address] Phone: [phone]   Fax:  [phone]  Physical Therapy Treatment  Patient Details  Name: LATONJA BOBECK MRN: [id number] Date of Birth: 1986-06-19 Referring Provider (PT): Meryl Crutch, NP-C   Encounter Date: 11/25/2018  PT End of Session - 11/25/18 1401    Visit Number  3    Number of Visits  13    Date for PT Re-Evaluation  12/16/18    Authorization Type  Medicaid    Authorization Time Period  Current Certification period: 11/04/2018 - 12/16/2018    Authorization - Visit Number  3    Authorization - Number of Visits  4    PT Start Time  1350    PT Stop Time  1430    PT Time Calculation (min)  40 min    Activity Tolerance  Patient tolerated treatment well;Patient limited by pain;Patient limited by fatigue    Behavior During Therapy  [facility] for tasks assessed/performed       Past Medical History:  Diagnosis Date  . Anemia   . Anxiety   . Asthma   . Depression   . Menorrhagia     Past Surgical History:  Procedure Laterality Date  . CESAREAN SECTION  2012  . TUBAL LIGATION      There were no vitals filed for this visit.  Subjective Assessment - 11/25/18 1353    Subjective  Patient report 7/10 pain in the low back, more to the right. Pt states it is "feeling good" today. She states she visit the doctor about her foot and it was diagnosed as "just a spasm," so she is relieved about that. She reports she had no pain after last treatment session until the following day. Pt reports her HEP is going well. She does it about every other day.     Pertinent History  Patient is a 32 y.o. female who presents to outpatient physical therapy with a referral for chronic low back pain. This patient's chief complaints consist of pain, leading to the following functional deficits: difficulty with all activities that require movement or motion including lifting, bending,  prolonged positions, sleeping. Relevant past medical history and comorbidities include "musculoskeletal issue around heart" told to take tylenol when needed (sees cardiologist when he feels is necessary, last was July 2018), obesity, previous episodes of low back pain, history of depression and anxiety, anemia. Imaging: From MRI report dated 07/15/2018: "1. 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 2. Diffusely decreased T1 signal throughout the visualized bone marrow, nonspecific, but suspected to be related to patient's history of anemia. 3. Otherwise normal MRI of the lumbar spine."    How long can you sit comfortably?  pain as soon as she sits    How long can you stand comfortably?  10-12 min    How long can you walk comfortably?  5-10 min    Diagnostic tests  MRI report dated 07/15/2018: "1. 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 2. Diffusely decreased T1 signal throughout the visualized bone marrow, nonspecific, but suspected to be related to patient's history of anemia. 3. Otherwise normal MRI of the lumbar spine."    Patient Stated Goals  Get rid of the pain so she can function better.     Currently  in Pain?  Yes    Pain Score  7     Pain Location  Back    Pain Orientation  Right;Mid;Lower    Pain Descriptors / Indicators  Dull    Pain Type  Chronic pain    Pain Onset  More than a month ago    Pain Frequency  Intermittent         PT Education - 11/25/18 1447    Education Details  cuing for form and purpose of exercise/activities    Person(s) Educated  Patient    Methods  Explanation;Demonstration;Tactile cues;Verbal cues    Comprehension  Verbalized understanding;Returned demonstration      TREATMENT: Denies latex allergy.  Therapeutic exercise:to centralize symptoms and  improve ROM and strength required for successful completion of functional activities.  - Treadmill level up to 2.0 mph, no grade. For improved lower extremity mobility, muscular endurance, and weightbearing activity tolerance; and to induce the analgesic effect of aerobic exercise, stimulate improved joint nutrition, and prepare body structures and systems for following interventions. x 5  Minutes during subjective exam. - prone lumbar extension (prone press up) 2x10 flat, x 6 with towel over thoracic spine and held down with hands,  x15 with head of bed elevated.  - Standing rows with scapular retraction for improved postural and shoulder girdle strengthening and mobility. Required instruction for technique and cuing to retract, posteriorly tilt, and depress scapulae. 2x10, 15# Cuing to prevent spinal flexion/extension each rep.  - seated lat pull with isolated scapular depression/elevation from pull x 20 with cuing for proper form.  -Standing single arm row with stabilized trunk to improve postural/trunk control while maintaining optimized postural position to improve tolerance and control with asymmetric loading. Cuing for technique and maximized postural control. 2x 10 each side. 15#   - single arm carry 2x40 feet each side with 10# dumbbell to activate lateral trunk stabilizers for an anti-lateral flexion exercise. Pain produced, no worse with right sided carrying.  - supine bridge for posterior trunk strengthening. Attempted but painful so discontinued. Pt may benefit from further work on this and better body awareness to attempt with neutral pelvis instead of posterior pelvic tilt.   Manual therapy: to reduce pain and tissue tension, improve range of motion, neuromodulation, in order to promote improved ability to complete functional activities. - prone CPA and right UPA grade III to lower thoracic and all lumbar segments focusing on levels of most discomfort (upper lumbar, lower thoracic). Pt  reported minimal change with continued repetitions but had much easier time performing prone press ups following.  - prone press ups with PA clinician overpressure at tender segments of upper lumbar and lower thoracic spine x 15-20. Not painful.   HOME EXERCISE PROGRAM Access Code: [facility]  URL: [email or web address]  Date: 11/05/2018  Prepared by: Norton Blizzard   Exercises   Prone Press Up - 10-15 reps - 1 second hold - 4x daily   Seated Correct Posture  Patient response to treatment:  Pt tolerated treatment well. She was able to return to standing exercises today due to lack of foot pain. She was able to tolerate gentle progressions in exercises. Bridges aggravated right sided low back pain and were discontinued shortly. Pt reported feeling better after manual but had some increase in soreness after carrying exercise. Pt required multimodal cuing for proper technique and to facilitate improved neuromuscular control, strength, range of motion, and functional ability with improved technique by end of session.   PT Short Term Goals -  11/05/18 1111      PT SHORT TERM GOAL #1   Title  Be independent with home exercise program completed at least 3 times per week for self-management of symptoms.    Baseline  Initial HEP provided at initial eval (11/04/2018);     Time  2    Period  Weeks    Status  New    Target Date  11/19/18      PT SHORT TERM GOAL #2   Title  Reduce pain to 8/10 with functional activities to allow patient to complete valued functional tasks such as sit to stand with less difficulty.    Baseline  reports 10/10 at initial evaluation (11/04/2018);     Time  2    Period  Weeks    Status  New    Target Date  11/19/18      PT SHORT TERM GOAL #3   Title  Improve lumbar AROM to 75% to allow patient to complete valued activities with less difficulty.     Baseline  50% with end range pain at initial evaluaiton (11/04/2018);     Time  2    Period  Weeks     Status  New    Target Date  11/19/18        PT Long Term Goals - 11/05/18 1212      PT LONG TERM GOAL #1   Title  Be independent with a long-term home exercise program for self-management of symptoms.     Baseline  Initial HEP provided at initial eval (11/04/2018);    Time  6    Period  Weeks    Status  New    Target Date  12/16/18      PT LONG TERM GOAL #2   Title  Demonstrate improved FOTO score by 10 units to demonstrate improvement in overall condition and self-reported functional ability.     Baseline  Foto = 47 (11/04/2018);    Time  6    Period  Weeks    Status  New    Target Date  12/16/18      PT LONG TERM GOAL #3   Title  Complete community, work and/or recreational activities without limitation due to current condition.     Baseline   limited in daily activities and responsibilities, limited to 5# of lifting, disrupts sleep. 5# weight lift limit (11/04/2018);    Time  6    Period  Weeks    Status  New    Target Date  12/16/18      PT LONG TERM GOAL #4   Title  Patient will increase BLE gross strength to 4+/5 as to improve functional strength for independent gait, increased standing tolerance and increased ADL ability.    Baseline  Hip: Abduction: R = 3+/5 painful , L = 4+/5 painful!. Hip Flexion: R = 3+/5, L = 4/5. Knee Flex: R = 4/5, L = 5/5. (11/04/2018);    Time  6    Period  Weeks    Status  New    Target Date  12/16/18      PT LONG TERM GOAL #5   Title  Be able to squat to chair height with proper form without limitation due to current condition in order to improve ability to lift and complete transfers during usual activities.     Baseline  Difficulty with sit<>stand transfers due to pain.     Time  6    Period  Weeks  Status  New    Target Date  12/16/18            Plan - 11/25/18 1445    Clinical Impression Statement  Pt tolerated treatment well. She was able to return to standing exercises today due to lack of foot pain. She was able to  tolerate gentle progressions in exercises. Bridges aggravated right sided low back pain and were discontinued shortly. Pt reported feeling better after manual but had some increase in soreness after carrying exercise. Pt required multimodal cuing for proper technique and to facilitate improved neuromuscular control, strength, range of motion, and functional ability with improved technique by end of session.     Rehab Potential  Good    Clinical Impairments Affecting Rehab Potential  (+) denies leg symptoms; (-) apparent subjective pain exaggeration, external locus of control, focus on pain.     PT Frequency  2x / week    PT Duration  6 weeks    PT Treatment/Interventions  ADLs/Self Care Home Management;Moist Heat;Cryotherapy;Functional mobility training;Stair training;Therapeutic activities;Therapeutic exercise;Balance training;Neuromuscular re-education;Patient/family education;Manual techniques;Passive range of motion;Dry needling;Spinal Manipulations;Joint Manipulations;Other (comment)   joint mobilizations grades I-V   PT Next Visit Plan  progress strengthening and stretching and specific exercise as tolerated/appropriate.  Progress note    PT Home Exercise Plan  [employer]  Access Code: [email or web address]    Consulted and Agree with Plan of Care  Patient       Patient will benefit from skilled therapeutic intervention in order to improve the following deficits and impairments:  Decreased endurance, Decreased mobility, Difficulty walking, Hypomobility, Increased muscle spasms, Decreased range of motion, Impaired perceived functional ability, Impaired tone, Obesity, Decreased activity tolerance, Decreased strength, Postural dysfunction, Pain, Impaired flexibility  Visit Diagnosis: Chronic bilateral low back pain without sciatica  Difficulty in walking, not elsewhere classified     Problem List Patient Active Problem List   Diagnosis Date Noted  . Menorrhagia with regular cycle 03/11/2018  .  Microcytic anemia 02/25/2018  . Iron deficiency anemia due to chronic blood loss 02/18/2018    Cira RueSara R Candee Hoon, PT, DPT 11/25/2018, 2:48 PM  [facility] [address] [place], [place], [address] Phone: [phone]   Fax:  [phone]  Name: Penni BombardJennifer M Fluellen MRN: [id number] Date of Birth: 06-09-1986

## 2018-11-26 ENCOUNTER — Encounter: Payer: Medicaid Other | Admitting: Physical Therapy

## 2018-11-28 ENCOUNTER — Encounter: Payer: Self-pay | Admitting: Physical Therapy

## 2018-11-28 ENCOUNTER — Encounter: Payer: Medicaid Other | Admitting: Physical Therapy

## 2018-11-28 ENCOUNTER — Ambulatory Visit: Payer: Medicaid Other | Admitting: Physical Therapy

## 2018-11-28 DIAGNOSIS — M545 Low back pain: Principal | ICD-10-CM

## 2018-11-28 DIAGNOSIS — R262 Difficulty in walking, not elsewhere classified: Secondary | ICD-10-CM

## 2018-11-28 DIAGNOSIS — G8929 Other chronic pain: Secondary | ICD-10-CM

## 2018-11-28 NOTE — Therapy (Signed)
[facility] [address] [place], [address] Phone: [phone]   Fax:  [phone]  Physical Therapy Treatment/Progress Note/Re-Certification Reporting period 11/04/2018 - 11/28/2018  Patient Details  Name: Cathy Trevino MRN: [id number] Date of Birth: 03-09-1986 Referring Provider (PT): Margurite Auerbach, NP-C   Encounter Date: 11/28/2018  PT End of Session - 11/28/18 1533    Visit Number  4    Number of Visits  13    Date for PT Re-Evaluation  12/16/18    Authorization Type  Medicaid    Authorization Time Period  Current Certification period: 11/28/2018 - 01/09/2019 (last PN 11/28/2018)    Authorization - Visit Number  4    Authorization - Number of Visits  4    PT Start Time  0932    PT Stop Time  1515    PT Time Calculation (min)  60 min    Activity Tolerance  Patient tolerated treatment well;Patient limited by pain;Patient limited by fatigue    Behavior During Therapy  [facility] for tasks assessed/performed       Past Medical History:  Diagnosis Date  . Anemia   . Anxiety   . Asthma   . Depression   . Menorrhagia     Past Surgical History:  Procedure Laterality Date  . CESAREAN SECTION  2012  . TUBAL LIGATION      There were no vitals filed for this visit.  Subjective Assessment - 11/28/18 1418    Subjective  Patient reports she is feeling a little better today at 6/10 in her low back, more to the right. She reports her foot has not been hurting at all. She states she felt "a little weird" for about 1 hour after last treatment session, but then the feeling went away. States her HEP is going well. She feels like PT is helping. She states it is giving her a way to do more and not let the pain limit her so much.  Reports pain gets up to 9.5/10 when doing functional activities.     Pertinent History  Patient is a 32 y.o. female who presents to outpatient physical therapy with a referral for chronic  low back pain. This patient's chief complaints consist of pain, leading to the following functional deficits: difficulty with all activities that require movement or motion including lifting, bending, prolonged positions, sleeping. Relevant past medical history and comorbidities include "musculoskeletal issue around heart" told to take tylenol when needed (sees cardiologist when he feels is necessary, last was July 2018), obesity, previous episodes of low back pain, history of depression and anxiety, anemia. Imaging: From MRI report dated 07/15/2018: "1. 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 2. Diffusely decreased T1 signal throughout the visualized bone marrow, nonspecific, but suspected to be related to patient's history of anemia. 3. Otherwise normal MRI of the lumbar spine."    How long can you sit comfortably?  pain as soon as she sits    How long can you stand comfortably?  15 min    How long can you walk comfortably?  10 min    Diagnostic tests  MRI report dated 07/15/2018: "1. 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 2. Diffusely decreased T1 signal throughout the visualized bone marrow, nonspecific, but suspected to be related to patient's history  of anemia. 3. Otherwise normal MRI of the lumbar spine."    Patient Stated Goals  Get rid of the pain so she can function better.     Currently in Pain?  Yes    Pain Score  6     Pain Location  Back    Pain Orientation  Mid;Lower;Right    Pain Descriptors / Indicators  Dull    Pain Type  Chronic pain    Pain Onset  More than a month ago    Pain Frequency  Intermittent    Aggravating Factors   any position too long, getting up in the morning, sitting is worse than standing, bending, lifting    Pain Relieving Factors  heating pad, 3-sided pillows  when having to sit for too long    Effect of Pain on Daily Activities  continues to be limited with more dififculty with all activities including ADLs, IADLs, caring for familiy, participating in usual activities.  Feels she has gained confidence and modivation to do more despite pain since starting PT         [facility] PT Assessment - 11/28/18 0001      Assessment   Medical Diagnosis  Chronic low back pai    Referring Provider (PT)  Margurite Auerbach, NP-C    Onset Date/Surgical Date  07/05/18    Next MD Visit  01/16/2018    Prior Therapy  no      Precautions   Precautions  Other (comment)   no lifting over 5#     Restrictions   Weight Bearing Restrictions  No    Other Position/Activity Restrictions  no lifting over 5#      Balance Screen   Has the patient fallen in the past 6 months  No    Has the patient had a decrease in activity level because of a fear of falling?   Yes    Is the patient reluctant to leave their home because of a fear of falling?   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           OBJECTIVE: OBSERVATION/INSPECTION: Patient presents with obesity, slight left shift.   SPINE MOTION Lumbar AROM:  *Indicates pain  Flexion: = ankles, 75% (reports normal range, ERP, painful return).  Extension: = 75% ERP.  Rotation: R = 100% ERP, L = 100% ERP  Side Flexion: R = just proximal to patella, L = just proximal to patella. Marland Kitchen  PERIPHERAL JOINT MOTION (AROM/PROM in degrees):  *Indicates pain BLE grossly WFL except hip extension limited bilaterally and  some difficulty due to pain with left  hip motion. Specific testing deferred to later date.   STRENGTH:  *Indicates pain Hip         Flexion: R = 3+/5, L = 4/5.  Abduction: R = 3+/5 painful , L = 4+/5 painful!. Knee  Ext: R = 5/5, L = 5/5.  Flex: R = 4/5, L = 5/5. Ankle (seated position)  Dorsiflexion: R = 5/5, L = 5/5.  Plantarflexion: R =  5/5, L = 5/5.   Eversion: R = 5/5, L = 5/5.  Great toe extension: R = 5/5, L = 5/5  REPEATED MOTIONS TESTING: - prone lumbar extension (prone press up) x 10: minimal effect   ACCESSORY MOTION:   CPA at mid thoracic and upper lumbar spine most tender.   CPA at base of spine over L5 mildly tender.   FUNCTIONAL MOBILITY:  Bed mobility: supine <> sit I, slow due to pain.   Transfers: sit <> stand I slow due to discomfort.   Gait: WFL for indoor distances.   Objective measurements completed on examination: See above findings.      TREATMENT: Denies latex allergy.  Therapeutic exercise:to centralize symptoms and improve ROM and strength required for successful completion of functional activities.  - Treadmill level up to 2.0 mph, no grade. For improved lower extremity mobility, muscular endurance, and weightbearing activity tolerance; and to induce the analgesic effect of aerobic exercise, stimulate improved joint nutrition, and prepare body structures and systems for following interventions. x 5  Minutes during subjective exam. - prone lumbar extension (prone press up)2x10 flat, x 6 with towel over thoracic spine and held down with hands,  x15 with head of bed elevated.   - Sidelying clam shell with green theraband wrapped around knees. Cuing to stack hips and prevent hip motion. x10 plus time for transitions and to learn activity.  - Sidelying open book (thoracic rotation) to improve thoracic, shoulder girdle, and upper trunk mobility. Required instruction for technique and cuing to achieve end range as tolerated, hold time, and breathing technique. 5 second  holds x 10 each side.  -Standing single arm row with stabilized trunk to improve postural/trunk control while maintaining optimized postural position to improve tolerance and control with asymmetric loading. Cuing for technique and maximized postural control. 2x 10 each side. double green theraband.  -Standing rows with scapular retraction for improved postural and shoulder girdle strengthening and mobility. Required instruction for technique and cuing to retract, posteriorly tilt, and depress scapulae.2x10, black theraband. Cuing to prevent spinal flexion/extension each rep.  - seated lat pull with isolated scapular depression/elevation from pull x 20 with cuing for proper form.   - single arm carry x 30 seconds holding 10-16# on each side to activate lateral trunk stabilizers for an anti-lateral flexion exercise.  - attempted step up with single arm carry of 16# in contralateral arm but reported increased back pain and had difficulty controlling descent so discontinued. Pt stated she only had a 12 inch step at home, so this was used as a trial.  -Education on HEP including handout with black and green therabands.  -Education on diagnosis, prognosis, POC, anatomy and physiology of current condition.  - measurements to assess progress (see above).   Manual therapy:to reduce pain and tissue tension, improve range of motion, neuromodulation, in order to promote improved ability to complete functional activities. - prone CPA III - IV to lower thoracic and all lumbar segments focusing on levels of most discomfort (upper lumbar, lower thoracic). Pt reported minimal change with continued repetitions but had much easier time performing prone press ups following.  - prone press ups with PA clinician overpressure at tender segments of upper lumbar and lower thoracic spine x 15-20. Decreased tenderness.    HOME EXERCISE PROGRAM Access Code: [facility]  URL: [email or web address]  Date:  11/28/2018  Prepared by: Rosita Kea   Exercises  Seated Correct Posture  Prone Press Up - 10-15 reps - 1 second hold -  4x daily  Clamshell - 10-15 reps - 1 second hold - 3 Sets - 1x daily - 3x weekly  Sidelying Thoracic Rotation with Open Book - 15 reps - 5 second hold - 3 Sets - 1x daily - 3x weekly  Standing Row with Resistance - 10-15 reps - 1 second hold - 3 Sets - 1x daily - 3x weekly  Kettlebell Suitcase Carry - 3 reps - 30 seconds hold - 1x daily - 3x weekly  Staggered Stance Single Arm Row with Anchored Resistance - 10-15 reps - 1 second hold - 3 Sets - 1x daily - 3x weekly  Walking - 30 min hold - 4x weekly    Patient response to treatment:  Pt tolerated treatmentwell with mild increase in lumbar discomfort by 1 point. She was able to continue with strengthening exercises and her HEP was updated today to include strengthening exercises after successful response to last session. Thoracic mobility exercises were added due to continued pain at lumbothoracic junction region. She was able to tolerate gentle progressions in exercises including step ups with single arm carry introduced today.Ptreported feeling better after manual but had some mild increase in soreness after carrying exercise.Pt requiredmultimodalcuing for proper technique and to facilitate improved neuromuscular control, strength, range of motion, and functional abilitywith improved technique by end of session.Patient continues to report high levels of pain numerically despite frequent reports of phrases such as "it's not too bad." Patient's son, Hetty Blend, was with her and required 2 bathroom breaks during session (unbilled breaks).    PT Short Term Goals - 11/28/18 1426      PT SHORT TERM GOAL #1   Title  Be independent with home exercise program completed at least 3 times per week for self-management of symptoms.    Baseline  Initial HEP provided at initial eval (11/04/2018);    Time  2    Period  Weeks    Status   Achieved    Target Date  11/19/18      PT SHORT TERM GOAL #2   Title  Reduce pain to 8/10 with functional activities to allow patient to complete valued functional tasks such as sit to stand with less difficulty.    Baseline  reports 10/10 at initial evaluation (11/04/2018); 9.5/10 (11/28/2018);    Time  2    Period  Weeks    Status  Partially Met    Target Date  12/12/18      PT SHORT TERM GOAL #3   Title  Improve lumbar AROM to 75% to allow patient to complete valued activities with less difficulty.     Baseline  50% with end range pain at initial evaluaiton (11/04/2018);     Time  2    Period  Weeks    Status  Achieved    Target Date  11/19/18        PT Long Term Goals - 11/28/18 1535      PT LONG TERM GOAL #1   Title  Be independent with a long-term home exercise program for self-management of symptoms.     Baseline  Initial HEP provided at initial eval (11/04/2018); Initial HEP and progressions have been provided but not final progressions (11/28/2018);     Time  6    Period  Weeks    Status  Partially Met    Target Date  01/09/19      PT LONG TERM GOAL #2   Title  Demonstrate improved FOTO score by 10 units to  demonstrate improvement in overall condition and self-reported functional ability.     Baseline  Foto = 47 (11/04/2018); too early to re-measure (11/28/2018)    Time  6    Period  Weeks    Status  On-going    Target Date  01/09/19      PT LONG TERM GOAL #3   Title  Complete community, work and/or recreational activities without limitation due to current condition.     Baseline   limited in daily activities and responsibilities, limited to 5# of lifting, disrupts sleep. 5# weight lift limit (11/04/2018); pt reports increased activty since starting PT, continues to have similar limitations to baseline (11/28/2018);    Time  6    Period  Weeks    Status  Partially Met    Target Date  01/09/19      PT LONG TERM GOAL #4   Title  Patient will increase BLE gross  strength to 4+/5 as to improve functional strength for independent gait, increased standing tolerance and increased ADL ability.    Baseline  Hip: Abduction: R = 3+/5 painful , L = 4+/5 painful!. Hip Flexion: R = 3+/5, L = 4/5. Knee Flex: R = 4/5, L = 5/5. (11/04/2018);    Time  6    Period  Weeks    Status  On-going    Target Date  01/09/19      PT LONG TERM GOAL #5   Title  Be able to squat to chair height with proper form without limitation due to current condition in order to improve ability to lift and complete transfers during usual activities.     Baseline  Difficulty with sit<>stand transfers due to pain but less than at initial eval (11/28/2018)    Time  6    Period  Weeks    Status  On-going    Target Date  01/09/19       ASSESSMENT:  Patient is a 32 y.o. female referred to outpatient physical therapy with a diagnosis of low back pain who presents with signs and symptoms consistent with chronic low back pain. Provisional classification lumbar derangement with extension preference vs Other: chronic pain.    Patient has attended 3 physical therapy treatment sessions and one initial evaluation. She is making progress towards goals but has not had sufficient time to expect significant changes in objective or subjective measures due to the nature of her condition. Subjectively, she reports improvement in activity tolerance and ability to be more active despite having some pain. Objectively, she demonstrates improved lumbar AROM and improved activity tolerance demonstrated by exercises performed in clinic. She would benefit from continued PT to continue working towards stated goals and PLOF.   Patient will benefit from skilled therapeutic intervention in order to improve the following deficits and impairments:  Decreased endurance, Decreased mobility, Difficulty walking, Hypomobility, Increased muscle spasms, Decreased range of motion, Impaired perceived functional ability, Impaired tone,  Obesity, Decreased activity tolerance, Decreased strength, Postural dysfunction, Pain, Impaired flexibility  Visit Diagnosis: Chronic bilateral low back pain without sciatica - Plan: PT plan of care cert/re-cert  Difficulty in walking, not elsewhere classified - Plan: PT plan of care cert/re-cert     Problem List Patient Active Problem List   Diagnosis Date Noted  . Menorrhagia with regular cycle 03/11/2018  . Microcytic anemia 02/25/2018  . Iron deficiency anemia due to chronic blood loss 02/18/2018    Nancy Nordmann, PT, DPT 11/28/2018, 3:42 PM  [facility] [address] [place], [address] Phone: [phone]   Fax:  [phone]  Name: SATONYA LUX MRN: [id number] Date of Birth: 04/18/1986

## 2018-12-03 ENCOUNTER — Ambulatory Visit: Payer: Medicaid Other | Admitting: Physical Therapy

## 2018-12-05 ENCOUNTER — Encounter: Payer: Self-pay | Admitting: Physical Therapy

## 2018-12-05 ENCOUNTER — Encounter: Payer: Medicaid Other | Admitting: Physical Therapy

## 2018-12-05 ENCOUNTER — Ambulatory Visit: Payer: Medicaid Other | Admitting: Physical Therapy

## 2018-12-05 DIAGNOSIS — G8929 Other chronic pain: Secondary | ICD-10-CM

## 2018-12-05 DIAGNOSIS — R262 Difficulty in walking, not elsewhere classified: Secondary | ICD-10-CM

## 2018-12-05 DIAGNOSIS — M545 Low back pain, unspecified: Secondary | ICD-10-CM

## 2018-12-05 NOTE — Therapy (Signed)
[facility] [address], [place], [address] Phone: [phone]   Fax:  [phone]  Physical Therapy Treatment  Patient Details  Name: Cathy Trevino MRN: [id number] Date of Birth: Feb 04, 1986 Referring Provider (PT): Margurite Auerbach, NP-C   Encounter Date: 12/05/2018  PT End of Session - 12/05/18 1002    Visit Number  5    Number of Visits  13    Date for PT Re-Evaluation  12/16/18    Authorization Type  Medicaid    Authorization Time Period  Current Certification period: 11/28/2018 - 01/09/2019 (last PN 11/28/2018)    Authorization - Visit Number  5    Authorization - Number of Visits  7    PT Start Time  0950    PT Stop Time  1030    PT Time Calculation (min)  40 min    Activity Tolerance  Patient tolerated treatment well;Patient limited by pain;Patient limited by fatigue    Behavior During Therapy  [facility] for tasks assessed/performed       Past Medical History:  Diagnosis Date  . Anemia   . Anxiety   . Asthma   . Depression   . Menorrhagia     Past Surgical History:  Procedure Laterality Date  . CESAREAN SECTION  2012  . TUBAL LIGATION      There were no vitals filed for this visit.  Subjective Assessment - 12/05/18 0955    Subjective  Patient reports no pain upon arrival today. She last had back pain last night 8/10 at right lumbar spine when she was trying to sleep. She reports feeling good after last treatment session with no excessive soreness or pain. Reports HEP is going "pretty okay" that she is doing them.     Pertinent History  Patient is a 32 y.o. female who presents to outpatient physical therapy with a referral for chronic low back pain. This patient's chief complaints consist of pain, leading to the following functional deficits: difficulty with all activities that require movement or motion including lifting, bending, prolonged positions, sleeping. Relevant past medical history and  comorbidities include "musculoskeletal issue around heart" told to take tylenol when needed (sees cardiologist when he feels is necessary, last was July 2018), obesity, previous episodes of low back pain, history of depression and anxiety, anemia. Imaging: From MRI report dated 07/15/2018: "1. 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 2. Diffusely decreased T1 signal throughout the visualized bone marrow, nonspecific, but suspected to be related to patient's history of anemia. 3. Otherwise normal MRI of the lumbar spine."    How long can you sit comfortably?  pain as soon as she sits    How long can you stand comfortably?  15 min    How long can you walk comfortably?  10 min    Diagnostic tests  MRI report dated 07/15/2018: "1. 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 2. Diffusely decreased T1 signal throughout the visualized bone marrow, nonspecific, but suspected to be related to patient's history of anemia. 3. Otherwise normal MRI of the lumbar spine."    Patient Stated Goals  Get rid of the pain so she can function better.     Currently in Pain?  No/denies    Pain Onset  More than a month ago  PT Education - 12/05/18 1001    Education Details  cuing for form. explanation of purpose of exercises/activities. advice for self-management    Person(s) Educated  Patient    Methods  Explanation;Demonstration;Tactile cues;Verbal cues    Comprehension  Verbalized understanding;Returned demonstration      TREATMENT: Denies latex allergy.  Therapeutic exercise:to centralize symptoms and improve ROM and strength required for successful completion of functional activities.  -Treadmill levelup to 2.0 mph, nograde.For improved lower extremity mobility, muscular endurance, and  weightbearing activity tolerance; and to induce the analgesic effect of aerobic exercise, stimulate improved joint nutrition, and prepare body structures and systems for following interventions. x5Minutes during subjective exam. - prone lumbar extension (prone press up)x15 flat,2x15 with head of bed elevated.    - Sidelying clam shell with green theraband wrapped around knees. Cuing to stack hips and prevent hip motion. x15 plus time for transitions. - Sidelying open book (thoracic rotation) to improve thoracic, shoulder girdle, and upper trunk mobility. Required instruction for technique and cuing to achieve end range as tolerated, hold time, and breathing technique. 5 second holds x 10 each side.   Manual therapy:to reduce pain and tissue tension, improve range of motion, neuromodulation, in order to promote improved ability to complete functional activities. - prone CPA III - IV to lowerthoracic and alllumbar segments focusing on levels of most discomfort(upper lumbar, lower thoracic). Pt reported minimal change with continued repetitions  - prone press up with overpressure over painful segments, x10 with no improvement.  - prone right UPA, centered around right lower thoracic spine, most pain near T10. During had slight decrease in intensity with continued mobilization grade III but no better after.   HOME EXERCISE PROGRAM Access Code: [facility]  URL: [email or web address]  Date: 11/28/2018  Prepared by: Rosita Kea   Exercises   Seated Correct Posture   Prone Press Up - 10-15 reps - 1 second hold - 4x daily   Clamshell - 10-15 reps - 1 second hold - 3 Sets - 1x daily - 3x weekly   Sidelying Thoracic Rotation with Open Book - 15 reps - 5 second hold - 3 Sets - 1x daily - 3x weekly   Standing Row with Resistance - 10-15 reps - 1 second hold - 3 Sets - 1x daily - 3x weekly   Kettlebell Suitcase Carry - 3 reps - 30 seconds hold - 1x daily - 3x weekly    Staggered Stance Single Arm Row with Anchored Resistance - 10-15 reps - 1 second hold - 3 Sets - 1x daily - 3x weekly   Walking - 30 min hold - 4x weekly    Patient response to treatment:  Pt tolerated treatmentfair. She arrived reporting no pain. She had significant increase in discomfort after press ups and manual back to her usual 6/10 pain. She had more difficulty with right sided clam shell exercises today than at past visits. She had some improvement during right UPA at lower thoracic spine, where her concordant pain was reproduced with pressure but was no better after. Pt was advised to apply heat that she states feels good at home at end of session. She reported her pain near her usual level upon leaving. She was not able to progress exercises today. Pt requiredmultimodalcuing for proper technique and to facilitate improved neuromuscular control, strength, range of motion, and functional abilitywith improved technique by end of session.Today was the first day pt reported no pain upon arrival. She is overall making progress towards her goals.  PT Short Term Goals - 11/28/18 1426      PT SHORT TERM GOAL #1   Title  Be independent with home exercise program completed at least 3 times per week for self-management of symptoms.    Baseline  Initial HEP provided at initial eval (11/04/2018);    Time  2    Period  Weeks    Status  Achieved    Target Date  11/19/18      PT SHORT TERM GOAL #2   Title  Reduce pain to 8/10 with functional activities to allow patient to complete valued functional tasks such as sit to stand with less difficulty.    Baseline  reports 10/10 at initial evaluation (11/04/2018); 9.5/10 (11/28/2018);    Time  2    Period  Weeks    Status  Partially Met    Target Date  12/12/18      PT SHORT TERM GOAL #3   Title  Improve lumbar AROM to 75% to allow patient to complete valued activities with less difficulty.     Baseline  50% with end range pain at initial  evaluaiton (11/04/2018);     Time  2    Period  Weeks    Status  Achieved    Target Date  11/19/18        PT Long Term Goals - 11/28/18 1535      PT LONG TERM GOAL #1   Title  Be independent with a long-term home exercise program for self-management of symptoms.     Baseline  Initial HEP provided at initial eval (11/04/2018); Initial HEP and progressions have been provided but not final progressions (11/28/2018);     Time  6    Period  Weeks    Status  Partially Met    Target Date  01/09/19      PT LONG TERM GOAL #2   Title  Demonstrate improved FOTO score by 10 units to demonstrate improvement in overall condition and self-reported functional ability.     Baseline  Foto = 47 (11/04/2018); too early to re-measure (11/28/2018)    Time  6    Period  Weeks    Status  On-going    Target Date  01/09/19      PT LONG TERM GOAL #3   Title  Complete community, work and/or recreational activities without limitation due to current condition.     Baseline   limited in daily activities and responsibilities, limited to 5# of lifting, disrupts sleep. 5# weight lift limit (11/04/2018); pt reports increased activty since starting PT, continues to have similar limitations to baseline (11/28/2018);    Time  6    Period  Weeks    Status  Partially Met    Target Date  01/09/19      PT LONG TERM GOAL #4   Title  Patient will increase BLE gross strength to 4+/5 as to improve functional strength for independent gait, increased standing tolerance and increased ADL ability.    Baseline  Hip: Abduction: R = 3+/5 painful , L = 4+/5 painful!. Hip Flexion: R = 3+/5, L = 4/5. Knee Flex: R = 4/5, L = 5/5. (11/04/2018);    Time  6    Period  Weeks    Status  On-going    Target Date  01/09/19      PT LONG TERM GOAL #5   Title  Be able to squat to chair height with proper form without limitation due to  current condition in order to improve ability to lift and complete transfers during usual activities.      Baseline  Difficulty with sit<>stand transfers due to pain but less than at initial eval (11/28/2018)    Time  6    Period  Weeks    Status  On-going    Target Date  01/09/19            Plan - 12/05/18 1509    Clinical Impression Statement  Pt tolerated treatmentfair. She arrived reporting no pain. She had significant increase in discomfort after press ups and manual back to her usual 6/10 pain. She had more difficulty with right sided clam shell exercises today than at past visits. She had some improvement during right UPA at lower thoracic spine, where her concordant pain was reproduced with pressure but was no better after. Pt was advised to apply heat that she states feels good at home at end of session. She reported her pain near her usual level upon leaving. She was not able to progress exercises today. Pt requiredmultimodalcuing for proper technique and to facilitate improved neuromuscular control, strength, range of motion, and functional abilitywith improved technique by end of session.Today was the first day pt reported no pain upon arrival. She is overall making progress towards her goals.     Rehab Potential  Good    Clinical Impairments Affecting Rehab Potential  (+) denies leg symptoms; (-) apparent subjective pain exaggeration, external locus of control, focus on pain.     PT Frequency  2x / week    PT Duration  6 weeks    PT Treatment/Interventions  ADLs/Self Care Home Management;Moist Heat;Cryotherapy;Functional mobility training;Stair training;Therapeutic activities;Therapeutic exercise;Balance training;Neuromuscular re-education;Patient/family education;Manual techniques;Passive range of motion;Dry needling;Spinal Manipulations;Joint Manipulations;Other (comment)   joint mobilizations grades I-V   PT Next Visit Plan  progress strengthening and stretching and specific exercise as tolerated/appropriate. Refrain from extension based exercises next visit if pain is low.      PT Home Exercise Plan  Medbridge  Access Code: [email or web address]    [email or web address] and Agree with Plan of Care  Patient       Patient will benefit from skilled therapeutic intervention in order to improve the following deficits and impairments:  Decreased endurance, Decreased mobility, Difficulty walking, Hypomobility, Increased muscle spasms, Decreased range of motion, Impaired perceived functional ability, Impaired tone, Obesity, Decreased activity tolerance, Decreased strength, Postural dysfunction, Pain, Impaired flexibility  Visit Diagnosis: Chronic bilateral low back pain without sciatica  Difficulty in walking, not elsewhere classified     Problem List Patient Active Problem List   Diagnosis Date Noted  . Menorrhagia with regular cycle 03/11/2018  . Microcytic anemia 02/25/2018  . Iron deficiency anemia due to chronic blood loss 02/18/2018    Nancy Nordmann, PT, DPT 12/05/2018, 3:12 PM  [facility] [address] [place], [address] Phone: [phone]   Fax:  [phone]  Name: Cathy Trevino MRN: [id number] Date of Birth: [birth year]

## 2018-12-09 ENCOUNTER — Encounter: Payer: Self-pay | Admitting: Physical Therapy

## 2018-12-09 ENCOUNTER — Encounter: Payer: Medicaid Other | Admitting: Physical Therapy

## 2018-12-09 ENCOUNTER — Ambulatory Visit: Payer: Medicaid Other | Admitting: Physical Therapy

## 2018-12-09 DIAGNOSIS — G8929 Other chronic pain: Secondary | ICD-10-CM

## 2018-12-09 DIAGNOSIS — R262 Difficulty in walking, not elsewhere classified: Secondary | ICD-10-CM

## 2018-12-09 DIAGNOSIS — M545 Low back pain, unspecified: Secondary | ICD-10-CM

## 2018-12-09 NOTE — Therapy (Addendum)
[facility] [address] [place], [address] Phone: [phone]   Fax:  [phone]  Physical Therapy Treatment  Patient Details  Name: Cathy Trevino MRN: [id number] Date of Birth: 03-12-1986 Referring Provider (PT): Margurite Auerbach, NP-C   Encounter Date: 12/09/2018  PT End of Session - 12/09/18 1305    Visit Number  6    Number of Visits  13    Date for PT Re-Evaluation  01/09/18    Authorization Type  Medicaid    Authorization Time Period  Current Certification period: 11/28/2018 - 01/09/2019 (last PN 11/28/2018)    Authorization - Visit Number  6    Authorization - Number of Visits  7    PT Start Time  2119    PT Stop Time  1348    PT Time Calculation (min)  45 min    Activity Tolerance  Patient tolerated treatment well;Patient limited by pain;Patient limited by fatigue    Behavior During Therapy  [facility] for tasks assessed/performed       Past Medical History:  Diagnosis Date  . Anemia   . Anxiety   . Asthma   . Depression   . Menorrhagia     Past Surgical History:  Procedure Laterality Date  . CESAREAN SECTION  2012  . TUBAL LIGATION      There were no vitals filed for this visit.  Subjective Assessment - 12/09/18 1308    Subjective  Patint reports her pain is low upon arrival, 4/10 at the lower right lumbar region. She states she felt better after 1 hour passed after her last treatment session. Reports her HEP is going "pretty good" and she has to take a break some times.  She feels her pain is "here and there" depending on what she does. It gets up to 8/10 when she is in bed.     Pertinent History  Patient is a 32 y.o. female who presents to outpatient physical therapy with a referral for chronic low back pain. This patient's chief complaints consist of pain, leading to the following functional deficits: difficulty with all activities that require movement or motion including lifting,  bending, prolonged positions, sleeping. Relevant past medical history and comorbidities include "musculoskeletal issue around heart" told to take tylenol when needed (sees cardiologist when he feels is necessary, last was July 2018), obesity, previous episodes of low back pain, history of depression and anxiety, anemia. Imaging: From MRI report dated 07/15/2018: "1. 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 2. Diffusely decreased T1 signal throughout the visualized bone marrow, nonspecific, but suspected to be related to patient's history of anemia. 3. Otherwise normal MRI of the lumbar spine."    How long can you sit comfortably?  pain as soon as she sits    How long can you stand comfortably?  15 min    How long can you walk comfortably?  10 min    Diagnostic tests  MRI report dated 07/15/2018: "1. 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 2. Diffusely decreased T1 signal throughout the visualized bone marrow, nonspecific, but suspected to be related to patient's history of anemia. 3. Otherwise normal MRI of the lumbar spine."    Patient Stated Goals  Get rid of the pain so she can function better.  Currently in Pain?  Yes    Pain Score  4     Pain Location  Back    Pain Orientation  Lower;Right    Pain Descriptors / Indicators  Dull    Pain Type  Chronic pain    Pain Onset  More than a month ago    Pain Frequency  Intermittent       TREATMENT: Denies latex allergy.  Therapeutic exercise:to centralize symptoms and improve ROM and strength required for successful completion of functional activities.  -Treadmill levelup to 2.0 mph, nograde.For improved lower extremity mobility, muscular endurance, and weightbearing activity tolerance; and to induce the analgesic effect of aerobic  exercise, stimulate improved joint nutrition, and prepare body structures and systems for following interventions. x5Minutes during subjective exam. -Standing rows with scapular retraction for improved postural and shoulder girdle strengthening and mobility. Required instruction for technique and cuing to retract, posteriorly tilt, and depress scapulae.x15 at 15#, x10 at 25#.Cuing to prevent spinal flexion/extension each rep.  - seated lat pull with isolated scapular depression/elevation from pull x 10 at 25#, x 15 at 200# with cuing for proper form.  - Standing pallof press (multifidus press) with double green theraband 2x10 each side. Cuing for trunk activation and sequencing of exercise.  - Modified push up against theraball on wall, for anterior trunk strengthening 2x10, cuing for body position and ball position (just under shoulders).  - Quadruped bird dog (alternating shoulder flexion/contralateral hip extension with core muscles braced) . Cuing for abdominal brace. X 10 each side, plus time for instruction, rest, and transition. - Hooklying dead bug ( knees, and shoulders flexed to 90 starting position then alternating hip/knee extension and contralateral shoulder flexion with abdominal activation). X 10 each side plus time for instruction, rest, and transition. cuing for care activation and to keep shins parallel to ground.    Therapeutic activities: for functional strengthening and improved functional activity tolerance. - step up with single arm carry of weight contralateral arm to improve lateral trunk and hip strength. SLS pause at top to improve functional balance. Able to complete x 8 with left step up and right carry of 15#, x 8 with right step up and left carry of 10#.   HOME EXERCISE PROGRAM Access Code: [facility]  URL: [email or web address]  Date: 11/28/2018  Prepared by: Rosita Kea   Exercises   Seated Correct Posture   Prone Press Up - 10-15 reps - 1  second hold - 4x daily   Clamshell - 10-15 reps - 1 second hold - 3 Sets - 1x daily - 3x weekly   Sidelying Thoracic Rotation with Open Book - 15 reps - 5 second hold - 3 Sets - 1x daily - 3x weekly   Standing Row with Resistance - 10-15 reps - 1 second hold - 3 Sets - 1x daily - 3x weekly   Kettlebell Suitcase Carry - 3 reps - 30 seconds hold - 1x daily - 3x weekly   Staggered Stance Single Arm Row with Anchored Resistance - 10-15 reps - 1 second hold - 3 Sets - 1x daily - 3x weekly   Walking - 30 min hold - 4x weekly   Patient response to treatment:  Pt tolerated treatmentwell. She was able to complete all exercises and activities with no overall increase in pain by end of treatment session. Today's session focused on trunk strenthening in functional positions to tolerance and some exercises were modified to lower resistance or reps when she found them uncomfortable. Manual  was not performed today second to significant increase in pain following manual at last session. Her right lumbar pain appears to be referring from higher levels of the spine near T10. Patient tolerated progressions in several exercises today. Pt requiredmultimodalcuing for proper technique and to facilitate improved neuromuscular control, strength, range of motion, and functional abilitywith improved technique by end of session. Patient is making overall progress towards goals. She continues to have deficits in activity tolerance, strength, pain, and range of motion that are limiting her ability to complete her usual ADLs, IADLs, child care tasks, and usual social and community activities without difficulty. She will benefit from continued physical therapy to address remaining impairments and functional deficits and work towards stated goals.   PT Education - 12/09/18 1310    Education Details  cuing for form, explanation of purpose of exercises/activities. Advice for self-management.     Person(s) Educated  Patient     Methods  Explanation;Demonstration;Tactile cues;Verbal cues    Comprehension  Verbalized understanding;Returned demonstration       PT Short Term Goals - 12/09/18 1408      PT SHORT TERM GOAL #1   Title  Be independent with home exercise program completed at least 3 times per week for self-management of symptoms.    Baseline  Initial HEP provided at initial eval (11/04/2018);    Time  2    Period  Weeks    Status  Achieved    Target Date  11/19/18      PT SHORT TERM GOAL #2   Title  Reduce pain to 8/10 with functional activities to allow patient to complete valued functional tasks such as sit to stand with less difficulty.    Baseline  reports 10/10 at initial evaluation (11/04/2018); 9.5/10 (11/28/2018); 8/10 at night while trying to sleep (12/09/2018)    Time  2    Period  Weeks    Status  Achieved    Target Date  12/12/18      PT SHORT TERM GOAL #3   Title  Improve lumbar AROM to 75% to allow patient to complete valued activities with less difficulty.     Baseline  50% with end range pain at initial evaluaiton (11/04/2018);     Time  2    Period  Weeks    Status  Achieved    Target Date  11/19/18        PT Long Term Goals - 12/09/18 1400      PT LONG TERM GOAL #1   Title  Be independent with a long-term home exercise program for self-management of symptoms.     Baseline  Initial HEP provided at initial eval (11/04/2018); Initial HEP and progressions have been provided but not final progressions (11/28/2018); Initial HEP and progressions have been provided but not final progressions (12/09/2018);     Time  6    Period  Weeks    Status  Partially Met    Target Date  01/09/18      PT LONG TERM GOAL #2   Title  Demonstrate improved FOTO score by 10 units to demonstrate improvement in overall condition and self-reported functional ability.     Baseline  Foto = 47 (11/04/2018); too early to re-measure (11/28/2018); not measured (12/09/2018);    Time  6    Period   Weeks    Status  On-going    Target Date  01/09/18      PT LONG TERM GOAL #3   Title  Complete community, work and/or recreational activities without limitation due to current condition.     Baseline   limited in daily activities and responsibilities, limited to 5# of lifting, disrupts sleep. 5# weight lift limit (11/04/2018); pt reports increased activty since starting PT, continues to have similar limitations to baseline (11/28/2018);; patient reports increased activity since strating PT, continues to have similar limitations to baseline but with slightly lower pain (12/09/2018)    Time  6    Period  Weeks    Status  Partially Met    Target Date  01/09/18      PT LONG TERM GOAL #4   Title  Patient will increase BLE gross strength to 4+/5 as to improve functional strength for independent gait, increased standing tolerance and increased ADL ability.    Baseline  Hip: Abduction: R = 3+/5 painful , L = 4+/5 painful!. Hip Flexion: R = 3+/5, L = 4/5. Knee Flex: R = 4/5, L = 5/5. (11/04/2018);     Time  6    Period  Weeks    Status  On-going    Target Date  01/09/18      PT LONG TERM GOAL #5   Title  Be able to squat to chair height with proper form without limitation due to current condition in order to improve ability to lift and complete transfers during usual activities.     Baseline  Difficulty with sit<>stand transfers due to pain but less than at initial eval (11/28/2018); improved sit <> stand transfers with reduced pain compared to initial eval (12/09/2018);    Time  6    Period  Weeks    Status  On-going    Target Date  01/09/18            Plan - 12/09/18 1407    Clinical Impression Statement  Pt tolerated treatmentwell. She was able to complete all exercises and activities with no overall increase in pain by end of treatment session. Today's session focused on trunk strenthening in functional positions to tolerance and some exercises were modified to lower resistance or  reps when she found them uncomfortable. Manual was not performed today second to significant increase in pain following manual at last session. Her right lumbar pain appears to be referring from higher levels of the spine near T10. Patient tolerated progressions in several exercises today. Pt requiredmultimodalcuing for proper technique and to facilitate improved neuromuscular control, strength, range of motion, and functional abilitywith improved technique by end of session. Patient is making overall progress towards goals. She continues to have deficits in activity tolerance, strength, pain, and range of motion that are limiting her ability to complete her usual ADLs, IADLs, child care tasks, and usual social and community activities without difficulty. She will benefit from continued physical therapy to address remaining impairments and functional deficits and work towards stated goals.     Rehab Potential  Good    Clinical Impairments Affecting Rehab Potential  (+) denies leg symptoms; (-) apparent subjective pain exaggeration, external locus of control, focus on pain.     PT Frequency  2x / week    PT Duration  6 weeks    PT Treatment/Interventions  ADLs/Self Care Home Management;Moist Heat;Cryotherapy;Functional mobility training;Stair training;Therapeutic activities;Therapeutic exercise;Balance training;Neuromuscular re-education;Patient/family education;Manual techniques;Passive range of motion;Dry needling;Spinal Manipulations;Joint Manipulations;Other (comment)   joint mobilizations grades I-V   PT Next Visit Plan  progress strengthening and stretching and specific exercise as tolerated/appropriate. Refrain from extension based exercises next visit if  pain is low.    PT Home Exercise Plan  [employer]  Access Code: [email or web address]    [email or web address] and Agree with Plan of Care  Patient       Patient will benefit from skilled therapeutic intervention in order to improve the following deficits and  impairments:  Decreased endurance, Decreased mobility, Difficulty walking, Hypomobility, Increased muscle spasms, Decreased range of motion, Impaired perceived functional ability, Impaired tone, Obesity, Decreased activity tolerance, Decreased strength, Postural dysfunction, Pain, Impaired flexibility  Visit Diagnosis: Chronic bilateral low back pain without sciatica  Difficulty in walking, not elsewhere classified     Problem List Patient Active Problem List   Diagnosis Date Noted  . Menorrhagia with regular cycle 03/11/2018  . Microcytic anemia 02/25/2018  . Iron deficiency anemia due to chronic blood loss 02/18/2018    Nancy Nordmann, PT, DPT 12/09/2018, 4:45 PM  [facility] [address], [place], [address] Phone: [phone]   Fax:  [phone]  Name: Cathy Trevino MRN: [id number] Date of Birth: 01/27/1986

## 2018-12-10 ENCOUNTER — Ambulatory Visit: Payer: Medicaid Other | Admitting: Physical Therapy

## 2018-12-12 ENCOUNTER — Encounter: Payer: Medicaid Other | Admitting: Physical Therapy

## 2018-12-14 IMAGING — CT CT RENAL STONE PROTOCOL
2 of 4 series · 17 of 46 positions shown, 19 images · non-contrast
Comparison: CT abdomen and pelvis 03/17/2016.

CLINICAL DATA: Left upper quadrant and left flank pain since
11/24/2016.

EXAM:
CT ABDOMEN AND PELVIS WITHOUT CONTRAST
TECHNIQUE: Multidetector CT imaging of the abdomen and pelvis was performed
following the standard protocol without IV contrast.

[Series 2: stone full standard · axial · 0.82mm/px · z∈[-566,-106]mm · 14 of 102 slices shown, 16 images]
[im 5/102  soft-tissue]
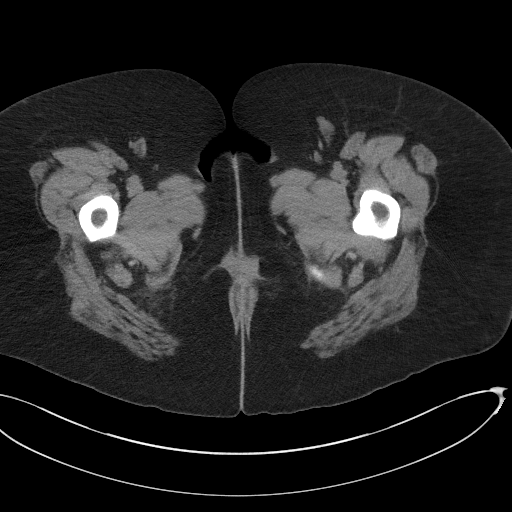
[im 5/102  bone]
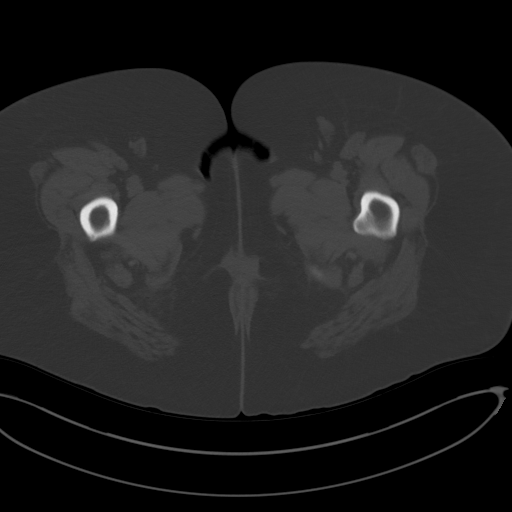
[im 13/102  soft-tissue]
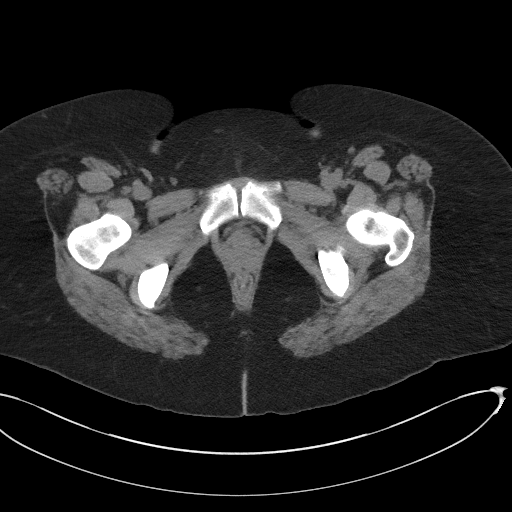
[im 22/102  soft-tissue]
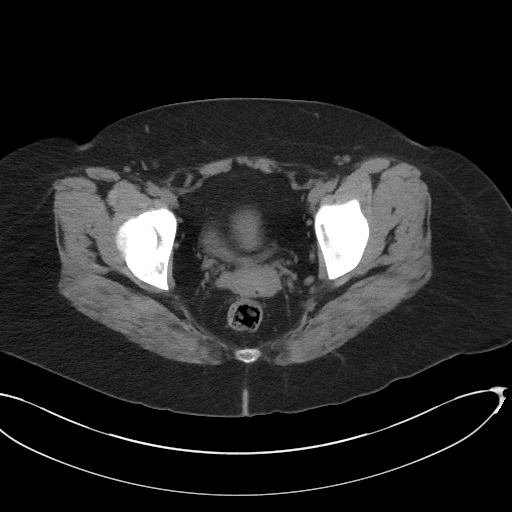
[im 26/102  soft-tissue]
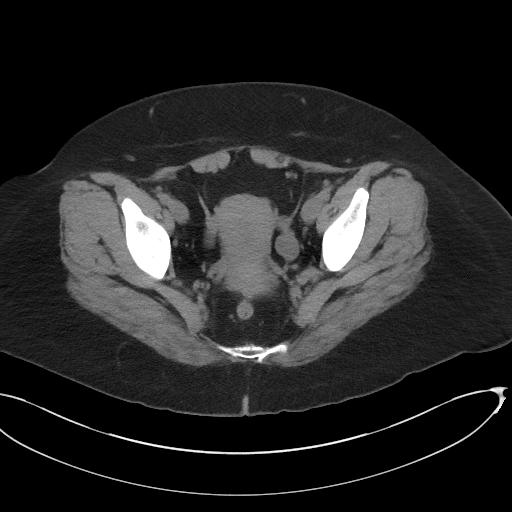
[im 34/102  soft-tissue]
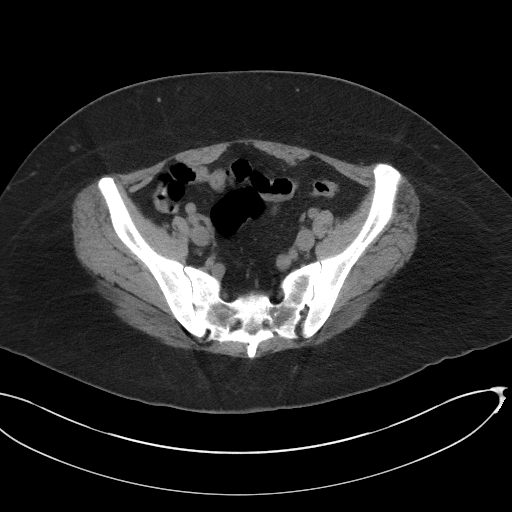
[im 43/102  soft-tissue]
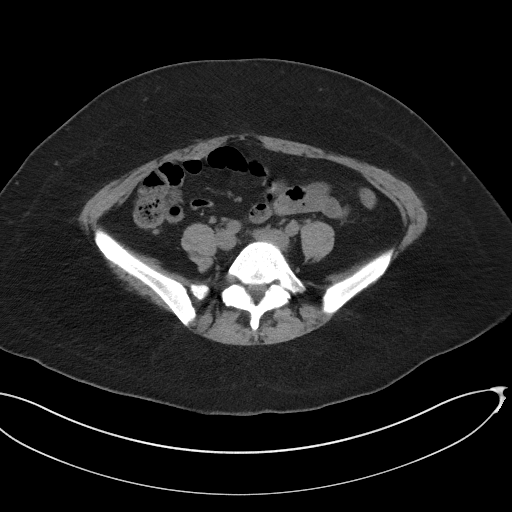
[im 47/102  soft-tissue]
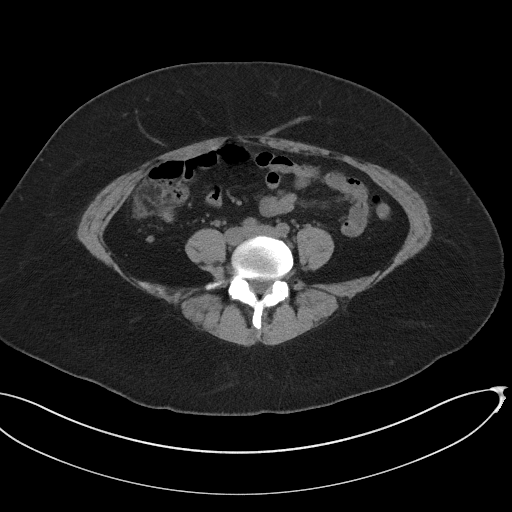
[im 55/102  soft-tissue]
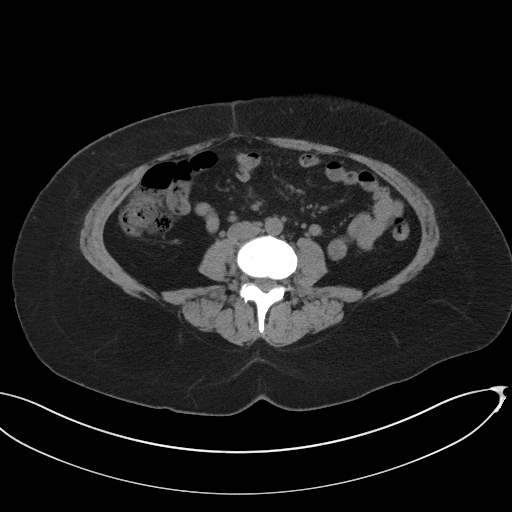
[im 59/102  soft-tissue]
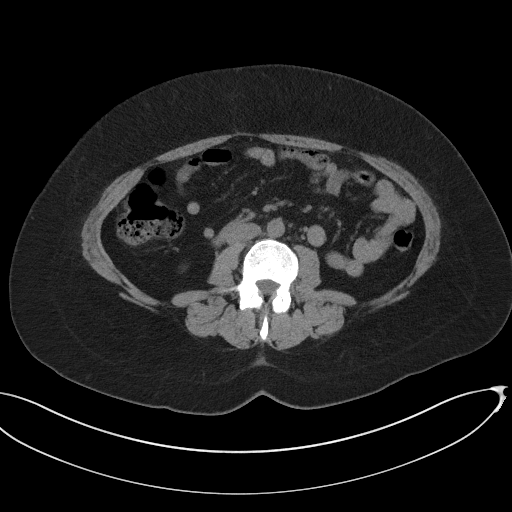
[im 59/102  bone]
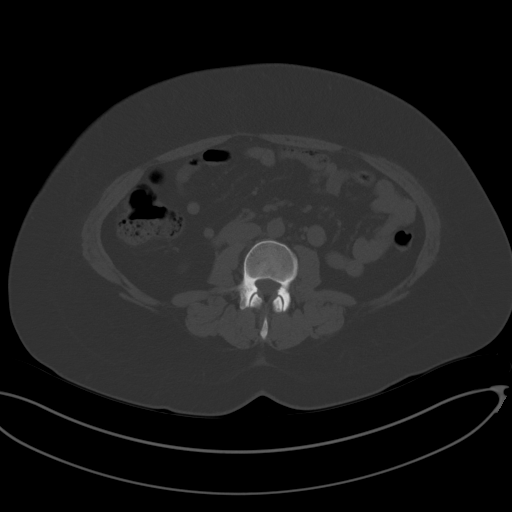
[im 68/102  soft-tissue]
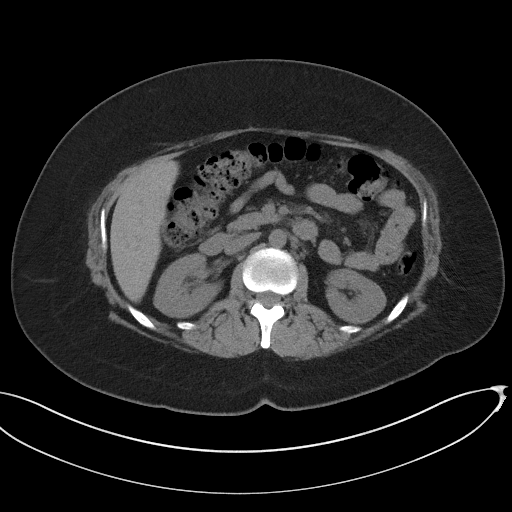
[im 76/102  soft-tissue]
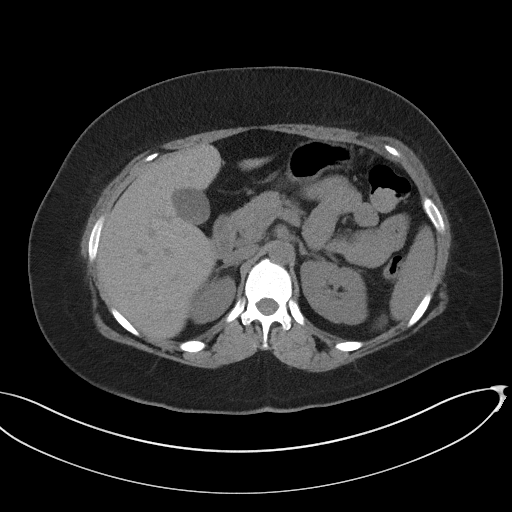
[im 80/102  soft-tissue]
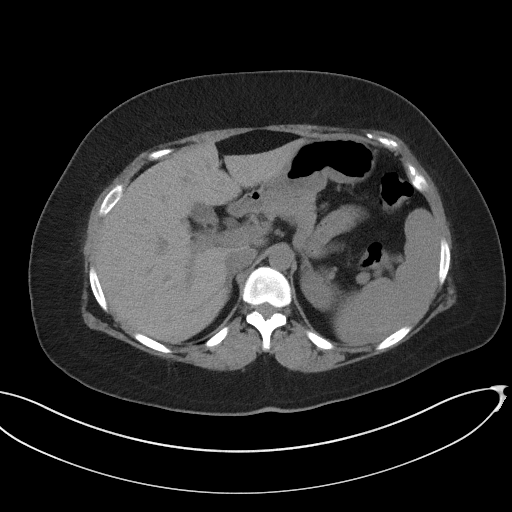
[im 89/102  soft-tissue]
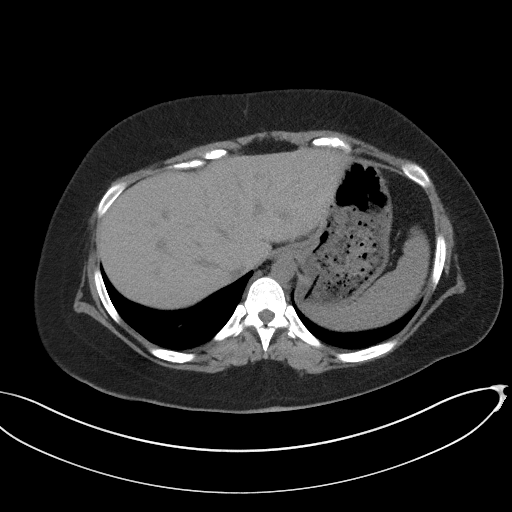
[im 97/102  soft-tissue]
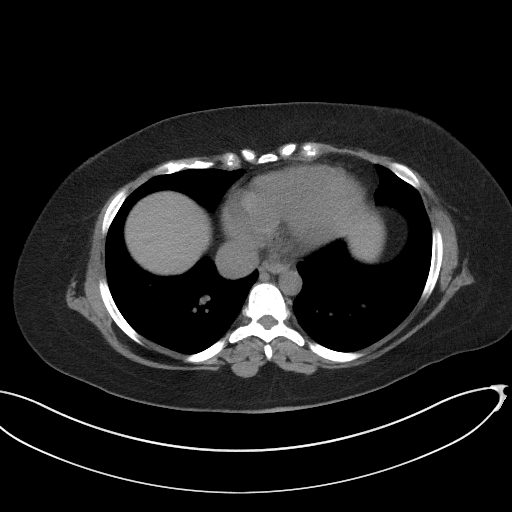

[Series 5: coronal · coronal · 0.96mm/px · 3 of 152 slices shown]
[im 51/152  soft-tissue]
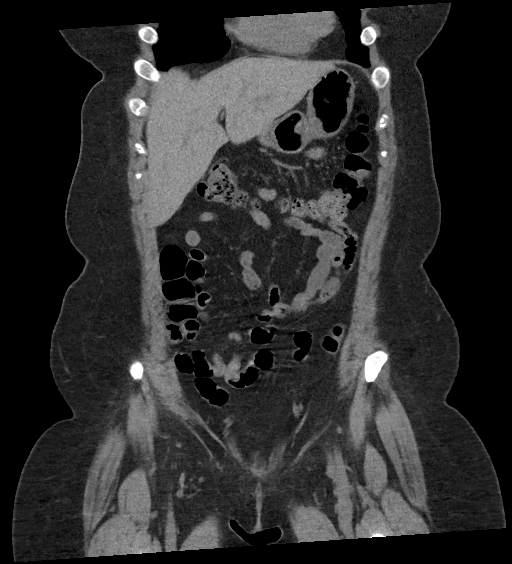
[im 68/152  soft-tissue]
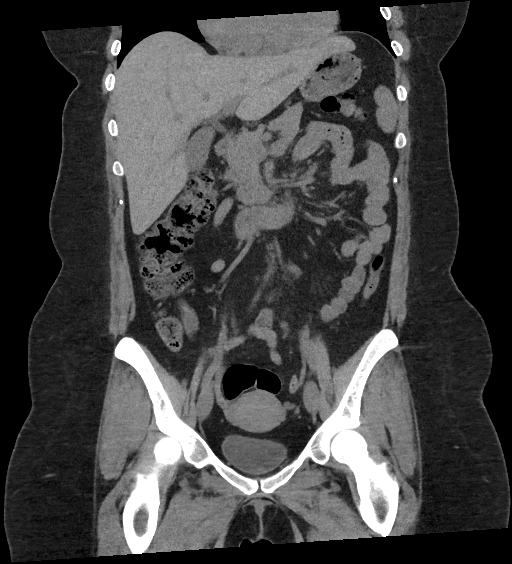
[im 84/152  soft-tissue]
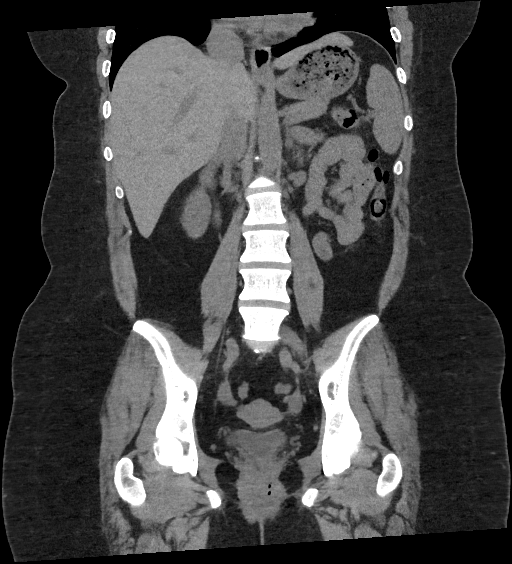

[17 of 46 positions shown; findings below may reference images not displayed]

FINDINGS: Lower chest: Lung bases are clear. No pleural or pericardial
effusion.

Hepatobiliary: No focal liver abnormality is seen. No gallstones,
gallbladder wall thickening, or biliary dilatation.

Pancreas: Unremarkable. No pancreatic ductal dilatation or
surrounding inflammatory changes.

Spleen: Normal in size without focal abnormality.

Adrenals/Urinary Tract: Adrenal glands are unremarkable. Kidneys are
normal, without renal calculi, focal lesion, or hydronephrosis.
Bladder is unremarkable.

Stomach/Bowel: Stomach is within normal limits. Appendix appears
normal. No evidence of bowel wall thickening, distention, or
inflammatory changes.

Vascular/Lymphatic: No significant vascular findings are present. No
enlarged abdominal or pelvic lymph nodes.

Reproductive: Uterus and bilateral adnexa are unremarkable.

Other: The patient has very small fat containing bilateral inguinal
hernias and a small fat containing umbilical hernia. No ascites.

Musculoskeletal: Negative.
IMPRESSION: Negative for urinary tract stone. No acute abnormality abdomen or
pelvis.

Small fat containing umbilical and bilateral inguinal hernias.

## 2018-12-16 ENCOUNTER — Encounter: Payer: Self-pay | Admitting: Physical Therapy

## 2018-12-16 ENCOUNTER — Encounter: Payer: Medicaid Other | Admitting: Physical Therapy

## 2018-12-16 ENCOUNTER — Ambulatory Visit: Payer: Medicaid Other

## 2018-12-16 DIAGNOSIS — G8929 Other chronic pain: Secondary | ICD-10-CM

## 2018-12-16 DIAGNOSIS — R262 Difficulty in walking, not elsewhere classified: Secondary | ICD-10-CM

## 2018-12-16 DIAGNOSIS — M545 Low back pain: Principal | ICD-10-CM

## 2018-12-16 NOTE — Therapy (Signed)
[facility] [address], [place], [address] Phone: [phone]   Fax:  [phone]  Physical Therapy Treatment  Patient Details  Name: Cathy Trevino MRN: [id number] Date of Birth: 23-Apr-1986 Referring Provider (PT): Margurite Auerbach, NP-C   Encounter Date: 12/16/2018  PT End of Session - 12/16/18 0857    Visit Number  7    Number of Visits  13    Date for PT Re-Evaluation  01/09/18    Authorization Type  Medicaid    Authorization Time Period  Current Certification period: 11/28/2018 - 01/09/2019 (last PN 11/28/2018)    Authorization - Visit Number  7    Authorization - Number of Visits  7    PT Start Time  0900    PT Stop Time  1324    PT Time Calculation (min)  44 min    Activity Tolerance  Patient tolerated treatment well;Patient limited by pain;Patient limited by fatigue    Behavior During Therapy  [facility] for tasks assessed/performed       Past Medical History:  Diagnosis Date  . Anemia   . Anxiety   . Asthma   . Depression   . Menorrhagia     Past Surgical History:  Procedure Laterality Date  . CESAREAN SECTION  2012  . TUBAL LIGATION      There were no vitals filed for this visit.  Subjective Assessment - 12/16/18 0901    Subjective  Patient reported that after her last PT session she was "sore", stated that it felt like muscle soreness but not pain.  Reported good compliance with HEP.    Pertinent History  Patient is a 32 y.o. female who presents to outpatient physical therapy with a referral for chronic low back pain. This patient's chief complaints consist of pain, leading to the following functional deficits: difficulty with all activities that require movement or motion including lifting, bending, prolonged positions, sleeping. Relevant past medical history and comorbidities include "musculoskeletal issue around heart" told to take tylenol when needed (sees cardiologist when he feels is  necessary, last was July 2018), obesity, previous episodes of low back pain, history of depression and anxiety, anemia. Imaging: From MRI report dated 07/15/2018: "1. 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 2. Diffusely decreased T1 signal throughout the visualized bone marrow, nonspecific, but suspected to be related to patient's history of anemia. 3. Otherwise normal MRI of the lumbar spine."    How long can you sit comfortably?  pain as soon as she sits    How long can you stand comfortably?  15 min    How long can you walk comfortably?  10 min    Diagnostic tests  MRI report dated 07/15/2018: "1. 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 2. Diffusely decreased T1 signal throughout the visualized bone marrow, nonspecific, but suspected to be related to patient's history of anemia. 3. Otherwise normal MRI of the lumbar spine."    Patient Stated Goals  Get rid of the pain so she can function better.     Currently in Pain?  Yes    Pain Score  0-No pain      TREATMENT:  Denies latex allergy.    Therapeutic exercise: to centralize symptoms and improve ROM and strength required for successful completion of functional  activities.  - Treadmill level up to 2.0 mph, no grade. For improved lower extremity mobility, muscular endurance, and weightbearing activity tolerance; and to induce the analgesic effect of aerobic exercise, stimulate improved joint nutrition, and prepare body structures and systems for following interventions. x 5  Minutes during subjective exam. - Standing rows with scapular retraction for improved postural and shoulder girdle strengthening and mobility. Required instruction for technique and cuing to retract, posteriorly tilt, and depress scapulae. x15 at 20#, x10 at 25# bilaterally.  Cuing to prevent spinal flexion/extension each rep. 15 x15# ea side unilaterally - seated lat pull with isolated scapular depression/elevation from pull x 10 at 25#, x 15 at 20# with cuing for proper form.  - Standing pallof press (multifidus press) with double green theraband 2x10 each side. Cuing for trunk activation and sequencing of exercise and upright posture. Lateral trunk rotations with 1 GTB x10 ea side with cues for technique, posture. Extensive cues needed throughout. - Modified Quadruped bird dog with hip extension Cuing for abdominal brace. X 10 each side, plus time for instruction, rest, and transition. - Hooklying dead bug ( knees, and shoulders flexed to 90 starting position then alternating hip/knee extension and contralateral shoulder flexion with abdominal activation). X 10 each side plus time for instruction, rest, and transition. cuing for care activation and to keep shins parallel to ground.  Hooklying alternating leg lifts in supine x10 ea side, cues for abdominal activation and breathing technique with fair carry over.    PT Education - 12/16/18 0856    Education Details  education of exercise technique and form, exercise purpose explanation    Person(s) Educated  Patient    Methods  Explanation;Demonstration;Tactile cues;Verbal cues    Comprehension  Verbalized understanding;Returned demonstration       PT Short Term Goals - 12/09/18 1408      PT SHORT TERM GOAL #1   Title  Be independent with home exercise program completed at least 3 times per week for self-management of symptoms.    Baseline  Initial HEP provided at initial eval (11/04/2018);    Time  2    Period  Weeks    Status  Achieved    Target Date  11/19/18      PT SHORT TERM GOAL #2   Title  Reduce pain to 8/10 with functional activities to allow patient to complete valued functional tasks such as sit to stand with less difficulty.    Baseline  reports 10/10 at initial evaluation (11/04/2018); 9.5/10  (11/28/2018); 8/10 at night while trying to sleep (12/09/2018)    Time  2    Period  Weeks    Status  Achieved    Target Date  12/12/18      PT SHORT TERM GOAL #3   Title  Improve lumbar AROM to 75% to allow patient to complete valued activities with less difficulty.     Baseline  50% with end range pain at initial evaluaiton (11/04/2018);     Time  2    Period  Weeks    Status  Achieved    Target Date  11/19/18        PT Long Term Goals - 12/09/18 1400      PT LONG TERM GOAL #1   Title  Be independent with a long-term home exercise program for self-management of symptoms.     Baseline  Initial HEP provided at initial eval (11/04/2018); Initial HEP and progressions have been provided but not final progressions (  11/28/2018); Initial HEP and progressions have been provided but not final progressions (12/09/2018);     Time  6    Period  Weeks    Status  Partially Met    Target Date  01/09/18      PT LONG TERM GOAL #2   Title  Demonstrate improved FOTO score by 10 units to demonstrate improvement in overall condition and self-reported functional ability.     Baseline  Foto = 47 (11/04/2018); too early to re-measure (11/28/2018); not measured (12/09/2018);    Time  6    Period  Weeks    Status  On-going    Target Date  01/09/18      PT LONG TERM GOAL #3   Title  Complete community, work and/or recreational activities without limitation due to current condition.     Baseline   limited in daily activities and responsibilities, limited to 5# of lifting, disrupts sleep. 5# weight lift limit (11/04/2018); pt reports increased activty since starting PT, continues to have similar limitations to baseline (11/28/2018);; patient reports increased activity since strating PT, continues to have similar limitations to baseline but with slightly lower pain (12/09/2018)    Time  6    Period  Weeks    Status  Partially Met    Target Date  01/09/18      PT LONG TERM GOAL #4   Title  Patient will  increase BLE gross strength to 4+/5 as to improve functional strength for independent gait, increased standing tolerance and increased ADL ability.    Baseline  Hip: Abduction: R = 3+/5 painful , L = 4+/5 painful!. Hip Flexion: R = 3+/5, L = 4/5. Knee Flex: R = 4/5, L = 5/5. (11/04/2018);     Time  6    Period  Weeks    Status  On-going    Target Date  01/09/18      PT LONG TERM GOAL #5   Title  Be able to squat to chair height with proper form without limitation due to current condition in order to improve ability to lift and complete transfers during usual activities.     Baseline  Difficulty with sit<>stand transfers due to pain but less than at initial eval (11/28/2018); improved sit <> stand transfers with reduced pain compared to initial eval (12/09/2018);    Time  6    Period  Weeks    Status  On-going    Target Date  01/09/18            Plan - 12/16/18 0948    Clinical Impression Statement  Patient started session with no pain, stated that this was new for her, and is excited about it. Session focused on strengthening and promoting core/trunk stabilization. Pt needed extensive cueing for new exercises and bird dogs to maintain technique, posture and breath. Pt with mild complaints of discomfort (1-2/10) with lateral trunk twists with green theraband, further reps held. Pt with fatigue at end of session as well. The patient would benefit from further skilled PT to continue to progress towards goals and ability to perform functional activities.    Rehab Potential  Good    Clinical Impairments Affecting Rehab Potential  (+) denies leg symptoms; (-) apparent subjective pain exaggeration, external locus of control, focus on pain.     PT Frequency  2x / week    PT Duration  6 weeks    PT Treatment/Interventions  ADLs/Self Care Home Management;Moist Heat;Cryotherapy;Functional mobility training;Stair training;Therapeutic activities;Therapeutic exercise;Balance  training;Neuromuscular  re-education;Patient/family education;Manual techniques;Passive range of motion;Dry needling;Spinal Manipulations;Joint Manipulations;Other (comment)    PT Next Visit Plan  progress strengthening and stretching and specific exercise as tolerated/appropriate. Refrain from extension based exercises next visit if pain is low.    PT Home Exercise Plan  [employer]  Access Code: [email or web address]    [email or web address] and Agree with Plan of Care  Patient       Patient will benefit from skilled therapeutic intervention in order to improve the following deficits and impairments:  Decreased endurance, Decreased mobility, Difficulty walking, Hypomobility, Increased muscle spasms, Decreased range of motion, Impaired perceived functional ability, Impaired tone, Obesity, Decreased activity tolerance, Decreased strength, Postural dysfunction, Pain, Impaired flexibility  Visit Diagnosis: Chronic bilateral low back pain without sciatica  Difficulty in walking, not elsewhere classified     Problem List Patient Active Problem List   Diagnosis Date Noted  . Menorrhagia with regular cycle 03/11/2018  . Microcytic anemia 02/25/2018  . Iron deficiency anemia due to chronic blood loss 02/18/2018    Lieutenant Diego PT, DPT 9:51 AM,12/16/18 [phone]  [facility] [address] [place], [address] Phone: [phone]   Fax:  [phone]  Name: Cathy Trevino MRN: [id number] Date of Birth: [birth year]

## 2018-12-19 ENCOUNTER — Encounter: Payer: Medicaid Other | Admitting: Physical Therapy

## 2018-12-31 ENCOUNTER — Ambulatory Visit: Payer: Medicaid Other | Admitting: Physical Therapy

## 2019-01-06 ENCOUNTER — Encounter: Payer: Self-pay | Admitting: Physical Therapy

## 2019-01-06 ENCOUNTER — Ambulatory Visit: Payer: Medicaid Other | Attending: Nurse Practitioner | Admitting: Physical Therapy

## 2019-01-06 DIAGNOSIS — M545 Low back pain, unspecified: Secondary | ICD-10-CM

## 2019-01-06 DIAGNOSIS — R262 Difficulty in walking, not elsewhere classified: Secondary | ICD-10-CM | POA: Insufficient documentation

## 2019-01-06 DIAGNOSIS — G8929 Other chronic pain: Secondary | ICD-10-CM | POA: Diagnosis present

## 2019-01-06 NOTE — Addendum Note (Signed)
Addended by: Norton Blizzard R on: 01/06/2019 07:53 PM   Modules accepted: Orders

## 2019-01-06 NOTE — Therapy (Signed)
[facility] [address] [place], [address] Phone: [phone]   Fax:  [phone]  Physical Therapy Treatment / Progress Note / Re-certification Reporting Period 11/28/2018 - 01/06/2019   Patient Details  Name: Cathy Trevino MRN: [id number] Date of Birth: 03-02-1986 Referring Provider (PT): Margurite Auerbach, NP-C   Encounter Date: 01/06/2019  PT End of Session - 01/06/19 1356    Visit Number  8    Number of Visits  22    Date for PT Re-Evaluation  03/03/19    Authorization Type  Medicaid reporting period from 01/06/2019; auth through 01/19/2019    Authorization Time Period  Current Certification period: 12/07/2019 - 03/03/2019 (last PN 01/06/2019)    Authorization - Visit Number  1    Authorization - Number of Visits  3    PT Start Time  1350    PT Stop Time  1430    PT Time Calculation (min)  40 min    Activity Tolerance  Patient tolerated treatment well;Patient limited by pain;Patient limited by fatigue    Behavior During Therapy  [facility] for tasks assessed/performed       Past Medical History:  Diagnosis Date  . Anemia   . Anxiety   . Asthma   . Depression   . Menorrhagia     Past Surgical History:  Procedure Laterality Date  . CESAREAN SECTION  2012  . TUBAL LIGATION      There were no vitals filed for this visit.  Subjective Assessment - 01/06/19 1352    Subjective  Pt reports no pain upon arrival. She has not been to physical therapy for almost 4 weeks related to delays in insurance authorization, holidays, and her not feeling well last week. She states she has been doing her HEP and continues to have intermittant pain in right hip and paresthesia to left toes. She states that the extension exercises in standing sometimes makes her foot worse.  Patient reports she feels like symptoms have gotten better overall but it somewhat depends on the day of the week.  She feels like if she gets a "kink" she  is better able to work it out. But at other times the pain is still bad and she cannot do the exercises.     Pertinent History  Patient is a 33 y.o. female who presents to outpatient physical therapy with a referral for chronic low back pain. This patient's chief complaints consist of pain, leading to the following functional deficits: difficulty with all activities that require movement or motion including lifting, bending, prolonged positions, sleeping. Relevant past medical history and comorbidities include "musculoskeletal issue around heart" told to take tylenol when needed (sees cardiologist when he feels is necessary, last was July 2018), obesity, previous episodes of low back pain, history of depression and anxiety, anemia. Imaging: From MRI report dated 07/15/2018: "1. 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 2. Diffusely decreased T1 signal throughout the visualized bone marrow, nonspecific, but suspected to be related to patient's history of anemia. 3. Otherwise normal MRI of the lumbar spine."    Limitations  Sitting;Walking;Lifting;House hold activities;Standing    How long can you sit comfortably?  5-6 minutes    How long can you stand comfortably?  5-6 minutes    How long can you walk comfortably?  10 min    Diagnostic tests  MRI report dated  07/15/2018: "1. 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 2. Diffusely decreased T1 signal throughout the visualized bone marrow, nonspecific, but suspected to be related to patient's history of anemia. 3. Otherwise normal MRI of the lumbar spine."    Patient Stated Goals  Get rid of the pain so she can function better.     Currently in Pain?  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    Leisure  child care for children with autism and ADHD a      Cognition   Overall Cognitive Status  Within Functional Limits for tasks assessed      Observation/Other Assessments   Observations  see note from 01/06/19 for latest objective information    Focus on Therapeutic Outcomes (FOTO)   33                  FOTO = 44  OBJECTIVE: OBSERVATION/INSPECTION: Patient presents withobesity, slight left shift, flattening of thoracic spine  PERIPHERAL JOINT MOTION (AROM/PROM in degrees):  *Indicates pain BLE grossly WFL except hip extension limited bilaterally and some difficulty due to pain with left hip motion.   STRENGTH:  *Indicates pain Hip  Flexion: R =4/5, L =4-/5.  Extension: R =3//5, L =4+/5.  Abduction: R =5-/52/10 pain, L =4+/5.  Knee  Ext: R =5/5, L =5/5.  Flex: R =5/5, L =5/5.  REPEATED MOTIONS TESTING: - prone lumbar extension (prone press up) x 10: minimal effect   ACCESSORY MOTION:  CPA at mid thoracic and upper lumbar spine most tender right side. No referral to hip.   CPA at base of spine over L5 mildly tender.  FUNCTIONAL TESTING:  - Sit to stand in 30 seconds: x5 (no UE support from chair).  FUNCTIONAL MOBILITY:  Bed mobility:supine <> sit I, slightly slow due to pain..  Transfers:sit <> stand I slightly slow due to discomfort.  Gait:WFL for indoor distances.  Objective measurements completed on examination: See above  findings.        TREATMENT: Denies latex allergy.  Therapeutic exercise:to centralize symptoms and improve ROM and strength required for successful completion of functional activities.  -Treadmill levelup to 2.0 mph, nograde.For improved lower extremity mobility, muscular endurance, and weightbearing activity tolerance; and to induce the analgesic effect of aerobic exercise, stimulate improved joint nutrition, and prepare body structures and systems for following interventions. x5Minutes during subjective exam.  - Examination to assess progress (See above).  - Sidelying open book (thoracic rotation) to improve thoracic, shoulder girdle, and upper trunk mobility. Required instruction for technique and cuing to achieve end range as tolerated, hold time, and breathing technique. 5 second holds. Right side only  x 10. Discontinued due to ongoing provocation of pain.  - spinal autoelongation with poles seated on theraball, to decrease compressive forces on spine and improve postural support strength and muscular endurance to decrease pain. Strong cuing for proper breathing, engagement of abdominal, back, and shoulder girdle muscles and for activation timing. Practiced several minutes to improve technique and achieve correct posture, then 2 minutes focused practice with constant cuing. Mirror feedback provided.   HOME EXERCISE PROGRAM Access Code: [facility]  URL: [email or web address]  Date: 11/28/2018  Prepared by: Rosita Kea   Exercises   Seated Correct Posture   Prone Press Up - 10-15 reps - 1 second hold - 4x daily   Clamshell - 10-15 reps - 1 second hold - 3 Sets - 1x daily - 3x weekly   Sidelying Thoracic Rotation with Open Book - 15 reps - 5 second hold - 3 Sets - 1x daily - 3x weekly   Standing Row with Resistance - 10-15 reps - 1 second hold - 3 Sets - 1x daily - 3x weekly   Kettlebell Suitcase Carry - 3 reps - 30 seconds hold - 1x daily - 3x weekly    Staggered Stance Single Arm Row with Anchored Resistance - 10-15 reps - 1 second hold - 3 Sets - 1x daily - 3x weekly   Walking - 30 min hold - 4x weekly   Patient response to treatment:  Pt tolerated treatmentfair. She was able to complete all exercises and activities with no overall increase in pain by end of treatment session but was unable to continue open book due to increasing pain in the right upper lumbar and lower thoracic spine. Today's session focused on re-examination, spine mobility, pain mitigation and trunk strengthening. Her right lumbar pain appears to be referring from higher levels of the spine near T10..Pt requiredmultimodalcuing for proper technique and to facilitate improved neuromuscular control, strength, range of motion, and functional abilitywith improved technique by end of session.    .  PT Education - 01/06/19 1922    Education Details   Exercise purpose/form. Self management techniques. Education on diagnosis, prognosis, POC, anatomy and physiology of current condition    Person(s) Educated  Patient    Methods  Explanation;Demonstration;Tactile cues;Verbal cues    Comprehension  Verbalized understanding;Returned demonstration           PT Short Term Goals - 12/09/18 1408      PT SHORT TERM GOAL #1   Title  Be independent with home exercise program completed at least 3 times per week for self-management of symptoms.    Baseline  Initial HEP provided at initial eval (11/04/2018);    Time  2    Period  Weeks    Status  Achieved    Target Date  11/19/18      PT SHORT TERM GOAL #2   Title  Reduce pain to 8/10 with functional activities to allow patient to complete valued functional tasks such as sit to stand with less difficulty.    Baseline  reports 10/10 at initial evaluation (11/04/2018); 9.5/10 (11/28/2018); 8/10 at night while trying to sleep (12/09/2018)    Time  2    Period  Weeks    Status  Achieved    Target Date  12/12/18      PT  SHORT TERM GOAL #3   Title  Improve lumbar AROM to 75% to allow patient to complete valued activities with less difficulty.     Baseline  50% with end range  pain at initial evaluaiton (11/04/2018);     Time  2    Period  Weeks    Status  Achieved    Target Date  11/19/18        PT Long Term Goals - 01/06/19 1940      PT LONG TERM GOAL #1   Title  Be independent with a long-term home exercise program for self-management of symptoms.     Baseline  Initial HEP provided at initial eval (11/04/2018); Initial HEP and progressions have been provided but not final progressions (11/28/2018); Initial HEP and progressions have been provided but not final progressions (12/09/2018; 01/06/2019)    Time  8    Period  Weeks    Status  Partially Met    Target Date  03/03/19      PT LONG TERM GOAL #2   Title  Demonstrate improved FOTO score by 10 units to demonstrate improvement in overall condition and self-reported functional ability.     Baseline  Foto = 47 (11/04/2018); too early to re-measure (11/28/2018); not measured (12/09/2018); 44 (01/06/2019);     Time  8    Period  Weeks    Status  On-going    Target Date  03/03/19      PT LONG TERM GOAL #3   Title  Complete community, work and/or recreational activities without limitation due to current condition.     Baseline   limited in daily activities and responsibilities, limited to 5# of lifting, disrupts sleep. 5# weight lift limit (11/04/2018); pt reports increased activty since starting PT, continues to have similar limitations to baseline (11/28/2018);; patient reports increased activity since strating PT, continues to have similar limitations to baseline but with slightly lower pain (12/09/2018); continues to have similar limitations but with less pain overall (01/06/2019);     Time  8    Period  Weeks    Status  On-going    Target Date  03/03/19      PT LONG TERM GOAL #4   Title  Patient will increase BLE gross strength to 4+/5 as to  improve functional strength for independent gait, increased standing tolerance and increased ADL ability.    Baseline  Hip: Abduction: R = 3+/5 painful , L = 4+/5 painful!. Hip Flexion: R = 3+/5, L = 4/5. Knee Flex: R = 4/5, L = 5/5. (11/04/2018); see objective exam (01/06/2019);      Time  8    Period  Weeks    Status  Partially Met    Target Date  03/03/19      PT LONG TERM GOAL #5   Title  Be able to squat to chair height with proper form without limitation due to current condition in order to improve ability to lift and complete transfers during usual activities.     Baseline  Difficulty with sit<>stand transfers due to pain but less than at initial eval (11/28/2018); improved sit <> stand transfers with reduced pain compared to initial eval (12/09/2018);; able to complete partial squat and improved sit to stand but continued pain (01/06/2019);     Time  8    Period  Weeks    Status  On-going    Target Date  03/03/19            Plan - 01/06/19 1944    Clinical Impression Statement  Patient has attended 8 treatment sessions this episode of care. She has had difficulty with attendance due to waiting for insurance authorization with the  new year and holidays and has not made significant progress since last progress note in mid December. This demonstrates her inability to maintain gains independently at this point and shows her need for continued skilled physical therapy. Since starting PT she reports improved activity tolerance, LE strength, and lumbar ROM and has overall made progress towards most goals.  She continues to have deficits in activity tolerance, strength, pain, and range of motion that are limiting her ability to complete her usual ADLs, IADLs, child care tasks, and usual social and community activities without difficulty. She will benefit from continued physical therapy to address remaining impairments and functional deficits and work towards stated goals.     Rehab Potential   Good    Clinical Impairments Affecting Rehab Potential  (+) denies leg symptoms; (-) apparent subjective pain exaggeration, external locus of control, focus on pain.     PT Frequency  2x / week    PT Duration  6 weeks    PT Treatment/Interventions  ADLs/Self Care Home Management;Moist Heat;Cryotherapy;Functional mobility training;Stair training;Therapeutic activities;Therapeutic exercise;Balance training;Neuromuscular re-education;Patient/family education;Manual techniques;Passive range of motion;Dry needling;Spinal Manipulations;Joint Manipulations;Other (comment)    PT Next Visit Plan  progress strengthening and stretching and specific exercise as tolerated/appropriate. Refrain from extension based exercises next visit if pain is low.    PT Home Exercise Plan  Medbridge  Access Code: [email or web address]    [email or web address] and Agree with Plan of Care  Patient       Patient will benefit from skilled therapeutic intervention in order to improve the following deficits and impairments:  Decreased endurance, Decreased mobility, Difficulty walking, Hypomobility, Increased muscle spasms, Decreased range of motion, Impaired perceived functional ability, Impaired tone, Obesity, Decreased activity tolerance, Decreased strength, Postural dysfunction, Pain, Impaired flexibility  Visit Diagnosis: Chronic bilateral low back pain without sciatica  Difficulty in walking, not elsewhere classified     Problem List Patient Active Problem List   Diagnosis Date Noted  . Menorrhagia with regular cycle 03/11/2018  . Microcytic anemia 02/25/2018  . Iron deficiency anemia due to chronic blood loss 02/18/2018    Nancy Nordmann, PT, DPT 01/06/2019, 7:45 PM  [place] [address] [place], [address] Phone: [phone]   Fax:  [phone]  Name: GENIYA FULGHAM MRN: [id number] Date of Birth: 1986/04/13

## 2019-01-08 ENCOUNTER — Ambulatory Visit: Payer: Medicaid Other | Admitting: Physical Therapy

## 2019-01-08 DIAGNOSIS — M545 Low back pain, unspecified: Secondary | ICD-10-CM

## 2019-01-08 DIAGNOSIS — R262 Difficulty in walking, not elsewhere classified: Secondary | ICD-10-CM

## 2019-01-08 DIAGNOSIS — G8929 Other chronic pain: Secondary | ICD-10-CM

## 2019-01-08 NOTE — Therapy (Signed)
[facility] [address] [place], [address] Phone: [phone]   Fax:  [phone]  Physical Therapy Treatment  Patient Details  Name: Cathy Trevino MRN: [id number] Date of Birth: [birth year] Referring Provider (PT): Margurite Auerbach, NP-C   Encounter Date: 01/08/2019  PT End of Session - 01/08/19 1532    Visit Number  9    Number of Visits  22    Date for PT Re-Evaluation  03/03/19    Authorization Type  Medicaid reporting period from 01/06/2019; auth through 01/19/2019    Authorization Time Period  Current Certification period: 12/07/2019 - 03/03/2019 (last PN 01/06/2019)    Authorization - Visit Number  2    Authorization - Number of Visits  3    PT Start Time  3500    PT Stop Time  1605    PT Time Calculation (min)  40 min    Activity Tolerance  Patient tolerated treatment well;Patient limited by pain;Patient limited by fatigue    Behavior During Therapy  [facility] for tasks assessed/performed       Past Medical History:  Diagnosis Date  . Anemia   . Anxiety   . Asthma   . Depression   . Menorrhagia     Past Surgical History:  Procedure Laterality Date  . CESAREAN SECTION  2012  . TUBAL LIGATION      There were no vitals filed for this visit.  Subjective Assessment - 01/08/19 1526    Subjective  Patient reports 6/10 pain in the right hip upon arrival. She reports she was a bit sore following last treatment session but it subsided quickly. She reports she is still doing prone press ups and it causing more pain.      Pertinent History  Patient is a 33 y.o. female who presents to outpatient physical therapy with a referral for chronic low back pain. This patient's chief complaints consist of pain, leading to the following functional deficits: difficulty with all activities that require movement or motion including lifting, bending, prolonged positions, sleeping. Relevant past medical history and  comorbidities include "musculoskeletal issue around heart" told to take tylenol when needed (sees cardiologist when he feels is necessary, last was July 2018), obesity, previous episodes of low back pain, history of depression and anxiety, anemia. Imaging: From MRI report dated 07/15/2018: "1. 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 2. Diffusely decreased T1 signal throughout the visualized bone marrow, nonspecific, but suspected to be related to patient's history of anemia. 3. Otherwise normal MRI of the lumbar spine."    Limitations  Sitting;Walking;Lifting;House hold activities;Standing    How long can you sit comfortably?  5-6 minutes    How long can you stand comfortably?  5-6 minutes    How long can you walk comfortably?  10 min    Diagnostic tests  MRI report dated 07/15/2018: "1. 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 2. Diffusely decreased T1 signal throughout the visualized bone marrow, nonspecific, but suspected to be related to patient's history of anemia. 3. Otherwise normal MRI of the lumbar spine."    Patient Stated Goals  Get rid of the pain so she can function better.     Currently in Pain?  Yes    Pain Score  6     Pain  Location  Hip    Pain Orientation  Right    Pain Descriptors / Indicators  Dull    Pain Type  Chronic pain    Pain Radiating Towards  right hip from back    Pain Frequency  Intermittent       TREATMENT: Denies latex allergy.  Therapeutic exercise:to centralize symptoms and improve ROM and strength required for successful completion of functional activities.  -Treadmill levelup to 1.6  mph, nograde.For improved lower extremity mobility, muscular endurance, and weightbearing activity tolerance; and to induce the analgesic effect of aerobic exercise,  stimulate improved joint nutrition, and prepare body structures and systems for following interventions. x5Minutes during subjective exam. Circuit 1:  -Standing rows with scapular retraction for improved postural and shoulder girdle strengthening and mobility. Required instruction for technique and cuing to retract, posteriorly tilt, and depress scapulae.x15 at 15#. Cuing to prevent spinal flexion/extension each rep.  - attempted seated lat pull with isolated scapular depression/elevation from pull but discontinued due to pain.  - Standing pallof press (multifidus press) with double green theraband 2x10 each side. Cuing for trunk activation and sequencing of exercise. Non-Circuit exercises:  - standing sidebending with 9# dumbell held in one hand at side, x10 to left without change in pain, x 3 to right causing increased pain at end ROM so discontinued.  - attempted side glide at wall with left hip to wall x 10. No effect.   Manual therapy: to reduce pain and tissue tension, improve range of motion, neuromodulation, in order to promote improved ability to complete functional activities. - STM to lumbar region to decrease pain and assess for painful location. - UPA and CPA along lumbar spine to assess source of pain and decrease it. Most tender near L3 right transverse process. ERP no better with repetition.  - sidelying manual right QL stretch with contract-relax techinique, no effect on pain.   HOME EXERCISE PROGRAM Access Code: [facility]  URL: [email or web address]  Date: 11/28/2018  Prepared by: Rosita Kea   Exercises   Seated Correct Posture   Prone Press Up - 10-15 reps - 1 second hold - 4x daily   Clamshell - 10-15 reps - 1 second hold - 3 Sets - 1x daily - 3x weekly   Sidelying Thoracic Rotation with Open Book - 15 reps - 5 second hold - 3 Sets - 1x daily - 3x weekly   Standing Row with Resistance - 10-15 reps - 1 second hold - 3 Sets - 1x daily - 3x weekly    Kettlebell Suitcase Carry - 3 reps - 30 seconds hold - 1x daily - 3x weekly   Staggered Stance Single Arm Row with Anchored Resistance - 10-15 reps - 1 second hold - 3 Sets - 1x daily - 3x weekly   Walking - 30 min hold - 4x weekly   Patient response to treatment:  Pt tolerated treatmentfair. She continued to have a high level of pain throughout session that at times she seemed to be unchanging and constant despite positioning, exercise, and manual therapy but at other times she seemed to indicate it was temporarily worse with movement such as sidebending to the right and UPA at L3 transverse process. No exercise or intervention could be found to bring her pain down at this session. She described her pain as hip pain. Plan to continue monitoring pain as it is unclear if it is musculoskeletal at this point. She reports her usual pain is better today but this pain does seem  to originate from similar although lower location in her right upper lumbar spine. However, her most painful segment has been at approximately T10 previously. Pt requiredmultimodalcuing for proper technique and to facilitate improved neuromuscular control, strength, range of motion, and functional abilitywith improved technique by end of session.         PT Education - 01/08/19 1532    Education Details  education of exercise technique and form, exercise purpose explanation    Person(s) Educated  Patient    Methods  Explanation;Demonstration;Tactile cues;Verbal cues    Comprehension  Verbalized understanding;Returned demonstration       PT Short Term Goals - 12/09/18 1408      PT SHORT TERM GOAL #1   Title  Be independent with home exercise program completed at least 3 times per week for self-management of symptoms.    Baseline  Initial HEP provided at initial eval (11/04/2018);    Time  2    Period  Weeks    Status  Achieved    Target Date  11/19/18      PT SHORT TERM GOAL #2   Title  Reduce pain to  8/10 with functional activities to allow patient to complete valued functional tasks such as sit to stand with less difficulty.    Baseline  reports 10/10 at initial evaluation (11/04/2018); 9.5/10 (11/28/2018); 8/10 at night while trying to sleep (12/09/2018)    Time  2    Period  Weeks    Status  Achieved    Target Date  12/12/18      PT SHORT TERM GOAL #3   Title  Improve lumbar AROM to 75% to allow patient to complete valued activities with less difficulty.     Baseline  50% with end range pain at initial evaluaiton (11/04/2018);     Time  2    Period  Weeks    Status  Achieved    Target Date  11/19/18        PT Long Term Goals - 01/06/19 1940      PT LONG TERM GOAL #1   Title  Be independent with a long-term home exercise program for self-management of symptoms.     Baseline  Initial HEP provided at initial eval (11/04/2018); Initial HEP and progressions have been provided but not final progressions (11/28/2018); Initial HEP and progressions have been provided but not final progressions (12/09/2018; 01/06/2019)    Time  8    Period  Weeks    Status  Partially Met    Target Date  03/03/19      PT LONG TERM GOAL #2   Title  Demonstrate improved FOTO score by 10 units to demonstrate improvement in overall condition and self-reported functional ability.     Baseline  Foto = 47 (11/04/2018); too early to re-measure (11/28/2018); not measured (12/09/2018); 44 (01/06/2019);     Time  8    Period  Weeks    Status  On-going    Target Date  03/03/19      PT LONG TERM GOAL #3   Title  Complete community, work and/or recreational activities without limitation due to current condition.     Baseline   limited in daily activities and responsibilities, limited to 5# of lifting, disrupts sleep. 5# weight lift limit (11/04/2018); pt reports increased activty since starting PT, continues to have similar limitations to baseline (11/28/2018);; patient reports increased activity since strating PT,  continues to have similar limitations to baseline but with slightly lower  pain (12/09/2018); continues to have similar limitations but with less pain overall (01/06/2019);     Time  8    Period  Weeks    Status  On-going    Target Date  03/03/19      PT LONG TERM GOAL #4   Title  Patient will increase BLE gross strength to 4+/5 as to improve functional strength for independent gait, increased standing tolerance and increased ADL ability.    Baseline  Hip: Abduction: R = 3+/5 painful , L = 4+/5 painful!. Hip Flexion: R = 3+/5, L = 4/5. Knee Flex: R = 4/5, L = 5/5. (11/04/2018); see objective exam (01/06/2019);      Time  8    Period  Weeks    Status  Partially Met    Target Date  03/03/19      PT LONG TERM GOAL #5   Title  Be able to squat to chair height with proper form without limitation due to current condition in order to improve ability to lift and complete transfers during usual activities.     Baseline  Difficulty with sit<>stand transfers due to pain but less than at initial eval (11/28/2018); improved sit <> stand transfers with reduced pain compared to initial eval (12/09/2018);; able to complete partial squat and improved sit to stand but continued pain (01/06/2019);     Time  8    Period  Weeks    Status  On-going    Target Date  03/03/19            Plan - 01/09/19 1917    Clinical Impression Statement  Patient has attended 9 treatment sessions this episode of care. She has had difficulty with attendance due to insurance authorization delays and illness recently and today present with some somewhat new pain that will continue to be monitored. Overall she was making progress towards goals before having difficulty with attendance. Since starting PT she reports improved activity tolerance, LE strength, and lumbar ROM and has overall made progress towards most goals. She continues to have deficits in activity tolerance, strength, pain, and range of motion that are limiting her  ability to complete her usual ADLs, IADLs, child care tasks, and usual social and community activities without difficulty. She will benefit from continued physical therapy to address remaining impairments and functional deficits and work towards stated goals.    Rehab Potential  Good    Clinical Impairments Affecting Rehab Potential  (+) denies leg symptoms; (-) apparent subjective pain exaggeration, external locus of control, focus on pain.     PT Frequency  2x / week    PT Duration  6 weeks    PT Treatment/Interventions  ADLs/Self Care Home Management;Moist Heat;Cryotherapy;Functional mobility training;Stair training;Therapeutic activities;Therapeutic exercise;Balance training;Neuromuscular re-education;Patient/family education;Manual techniques;Passive range of motion;Dry needling;Spinal Manipulations;Joint Manipulations;Other (comment)    PT Next Visit Plan  progress strengthening and stretching and specific exercise as tolerated/appropriate. Refrain from extension based exercises next visit if pain is low. Continue to monitor new location of pain.     PT Home Exercise Plan  Med[employer]  Access Code: [email or web address]    [email or web address] and Agree with Plan of Care  Patient       Patient will benefit from skilled therapeutic intervention in order to improve the following deficits and impairments:  Decreased endurance, Decreased mobility, Difficulty walking, Hypomobility, Increased muscle spasms, Decreased range of motion, Impaired perceived functional ability, Impaired tone, Obesity, Decreased activity tolerance, Decreased strength, Postural dysfunction, Pain, Impaired flexibility  Visit  Diagnosis: Chronic bilateral low back pain without sciatica  Difficulty in walking, not elsewhere classified     Problem List Patient Active Problem List   Diagnosis Date Noted  . Menorrhagia with regular cycle 03/11/2018  . Microcytic anemia 02/25/2018  . Iron deficiency anemia due to chronic blood loss 02/18/2018     Nancy Nordmann, PT, DPT 01/09/2019, 7:17 PM  [facility] [address] [place], [address] Phone: [phone]   Fax:  [phone]  Name: ALACIA REHMANN MRN: [id number] Date of Birth: 11-14-1986

## 2019-01-09 ENCOUNTER — Encounter: Payer: Self-pay | Admitting: Physical Therapy

## 2019-01-15 ENCOUNTER — Ambulatory Visit: Payer: Medicaid Other | Admitting: Physical Therapy

## 2019-01-15 DIAGNOSIS — M545 Low back pain, unspecified: Secondary | ICD-10-CM

## 2019-01-15 DIAGNOSIS — R262 Difficulty in walking, not elsewhere classified: Secondary | ICD-10-CM

## 2019-01-15 DIAGNOSIS — G8929 Other chronic pain: Secondary | ICD-10-CM

## 2019-01-15 NOTE — Therapy (Signed)
[facility] [address] [place], [address] Phone: [phone]   Fax:  [phone]  Physical Therapy Treatment  Patient Details  Name: Cathy Trevino MRN: [id number] Date of Birth: 10-21-1986 Referring Provider (PT): Margurite Auerbach, NP-C   Encounter Date: 01/15/2019  PT End of Session - 01/15/19 1128    Visit Number  10    Number of Visits  22    Date for PT Re-Evaluation  03/03/19    Authorization Type  Medicaid reporting period from 01/06/2019; auth through 01/19/2019    Authorization Time Period  Current Certification period: 12/07/2019 - 03/03/2019 (last PN 01/06/2019)    Authorization - Visit Number  3    Authorization - Number of Visits  3    PT Start Time  1120    PT Stop Time  1200    PT Time Calculation (min)  40 min    Activity Tolerance  Patient tolerated treatment well;Patient limited by pain;Patient limited by fatigue    Behavior During Therapy  [facility] for tasks assessed/performed       Past Medical History:  Diagnosis Date  . Anemia   . Anxiety   . Asthma   . Depression   . Menorrhagia     Past Surgical History:  Procedure Laterality Date  . CESAREAN SECTION  2012  . TUBAL LIGATION      There were no vitals filed for this visit.  Subjective Assessment - 01/15/19 1121    Subjective  Patient reports she is "mellow" right now with pain 4/10 in the right low back. She last felt her foot tingle about a week ago.  She reports she saw her medical provider yesterday for her side pain and was prescribed new medication, meloxicam and a muscle relaxant that she started taking last night. She was told the tingling in her foot was from a disc herniation near L5 and the pain in her upper lumbar and lower thoracic region was likely from muscle spasm/inflammation. She state her pain gradually got better after her last treatment session. The next day her pain was better. She continues to complete HEP. She  state her medical provider thought it was odd that she was having so much pain higher up in the spine.     Pertinent History  Patient is a 33 y.o. female who presents to outpatient physical therapy with a referral for chronic low back pain. This patient's chief complaints consist of pain, leading to the following functional deficits: difficulty with all activities that require movement or motion including lifting, bending, prolonged positions, sleeping. Relevant past medical history and comorbidities include "musculoskeletal issue around heart" told to take tylenol when needed (sees cardiologist when he feels is necessary, last was July 2018), obesity, previous episodes of low back pain, history of depression and anxiety, anemia. Imaging: From MRI report dated 07/15/2018: "1. 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 2. Diffusely decreased T1 signal throughout the visualized bone marrow, nonspecific, but suspected to be related to patient's history of anemia. 3. Otherwise normal MRI of the lumbar spine."    Limitations  Sitting;Walking;Lifting;House hold activities;Standing    How long can you sit comfortably?  5-6 minutes    How long can you stand comfortably?  5-6 minutes    How long can you walk comfortably?  10 min    Diagnostic tests  MRI report dated  07/15/2018: "1. 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 2. Diffusely decreased T1 signal throughout the visualized bone marrow, nonspecific, but suspected to be related to patient's history of anemia. 3. Otherwise normal MRI of the lumbar spine."    Patient Stated Goals  Get rid of the pain so she can function better.     Currently in Pain?  Yes    Pain Score  4     Pain Location  Back    Pain Orientation  Right    Pain Descriptors / Indicators  Dull     Pain Type  Chronic pain    Pain Frequency  Intermittent         OBJECTIVE:  - squats: buttocks taps in chair x 10 with some difficulty due to imbalance while coordinating deeper quat.     TREATMENT: Denies latex allergy.  Therapeutic exercise:to centralize symptoms and improve ROM and strength required for successful completion of functional activities.  -Treadmill levelup to 1.6  mph, nograde.For improved lower extremity mobility, muscular endurance, and weightbearing activity tolerance; and to induce the analgesic effect of aerobic exercise, stimulate improved joint nutrition, and prepare body structures and systems for following interventions. x5Minutes during subjective exam. - Education on HEP including handout  Circuit 1:  - Squats with theraband wrapped around knees for isometric abduction to reduce knee valgus, chair behind to improve confidence and reduce fear of falling backwards while working on hip hinge. Cuing for hip hinge, equal weightbearing, neutral to mild genuvarum in bilateral knees, keeping tibias perpendicular to floor to reduce forward translation of knees during movement. X5, x10, x10 -Standing rows with scapular retraction for improved postural and shoulder girdle strengthening and mobility. Required instruction for technique and cuing to retract, posteriorly tilt, and depress scapulae.2x15 at 15#. Cuing to prevent spinal flexion/extension each rep.  - seated lat pull with isolated scapular depression/elevation from pullx 15 at 15#, x 15 at 20#with cuing for proper form. -Standing pallof press (multifidus press) withdouble greentheraband 2x10 each side. Cuing for trunkactivation and sequencing of exercise. Circuit 2: - Quadruped bird dog with hip extensionCuing for abdominal brace. 2X 10 each side,plus time for instruction, rest, and transition. -Hooklying dead bug ( knees, and shoulders flexed to 90 starting position then alternating  hip/knee extension and contralateral shoulder flexion with abdominal activation).2X 10 each sideplus time for instruction, rest, and transition. cuing for care activation and to keep shins parallel to ground.  HOME EXERCISE PROGRAM Access Code: [facility]  URL: [email or web address]  Date: 01/15/2019  Prepared by: Rosita Kea   Exercises  Squat with Chair Touch - 10-15 reps - 1 second hold - 3 Sets - 1x daily - 7x weekly  Bird Dog - 10-15 reps - 1 second hold - 3 Sets - 1x daily - 7x weekly  Supine Dead Bug with Leg Extension - 10-15 reps - 1 second hold - 3 Sets - 1x daily - 7x weekly  Staggered Stance Single Arm Row with Anchored Resistance - 10-15 reps - 1 second hold - 3 Sets - 1x daily - 3x weekly  Seated Correct Posture  Walking - 30 min hold - 4x weekly     Patient response to treatment:  Pt tolerated treatmentwell. She was able to return to trunk and LE strengthening exercises today without provocation of pain. Continues to report right upper lumbar/low thoracic region as her most painful location in her back but very  tolerable today during exercise and manual was not applied due to lack of improvement at previous sessions. Pt requiredmultimodalcuing for proper technique and to facilitate improved neuromuscular control, strength, range of motion, and functional abilitywith improved technique by end of session.       PT Education - 01/15/19 1128    Education Details  education of exercise technique and form, exercise purpose explanation    Person(s) Educated  Patient    Methods  Explanation;Demonstration;Tactile cues;Verbal cues    Comprehension  Verbalized understanding;Returned demonstration       PT Short Term Goals - 12/09/18 1408      PT SHORT TERM GOAL #1   Title  Be independent with home exercise program completed at least 3 times per week for self-management of symptoms.    Baseline  Initial HEP provided at initial eval (11/04/2018);     Time  2    Period  Weeks    Status  Achieved    Target Date  11/19/18      PT SHORT TERM GOAL #2   Title  Reduce pain to 8/10 with functional activities to allow patient to complete valued functional tasks such as sit to stand with less difficulty.    Baseline  reports 10/10 at initial evaluation (11/04/2018); 9.5/10 (11/28/2018); 8/10 at night while trying to sleep (12/09/2018)    Time  2    Period  Weeks    Status  Achieved    Target Date  12/12/18      PT SHORT TERM GOAL #3   Title  Improve lumbar AROM to 75% to allow patient to complete valued activities with less difficulty.     Baseline  50% with end range pain at initial evaluaiton (11/04/2018);     Time  2    Period  Weeks    Status  Achieved    Target Date  11/19/18        PT Long Term Goals - 01/06/19 1940      PT LONG TERM GOAL #1   Title  Be independent with a long-term home exercise program for self-management of symptoms.     Baseline  Initial HEP provided at initial eval (11/04/2018); Initial HEP and progressions have been provided but not final progressions (11/28/2018); Initial HEP and progressions have been provided but not final progressions (12/09/2018; 01/06/2019)    Time  8    Period  Weeks    Status  Partially Met    Target Date  03/03/19      PT LONG TERM GOAL #2   Title  Demonstrate improved FOTO score by 10 units to demonstrate improvement in overall condition and self-reported functional ability.     Baseline  Foto = 47 (11/04/2018); too early to re-measure (11/28/2018); not measured (12/09/2018); 44 (01/06/2019);     Time  8    Period  Weeks    Status  On-going    Target Date  03/03/19      PT LONG TERM GOAL #3   Title  Complete community, work and/or recreational activities without limitation due to current condition.     Baseline   limited in daily activities and responsibilities, limited to 5# of lifting, disrupts sleep. 5# weight lift limit (11/04/2018); pt reports increased activty since  starting PT, continues to have similar limitations to baseline (11/28/2018);; patient reports increased activity since strating PT, continues to have similar limitations to baseline but with slightly lower pain (12/09/2018); continues to have similar limitations but with  less pain overall (01/06/2019);     Time  8    Period  Weeks    Status  On-going    Target Date  03/03/19      PT LONG TERM GOAL #4   Title  Patient will increase BLE gross strength to 4+/5 as to improve functional strength for independent gait, increased standing tolerance and increased ADL ability.    Baseline  Hip: Abduction: R = 3+/5 painful , L = 4+/5 painful!. Hip Flexion: R = 3+/5, L = 4/5. Knee Flex: R = 4/5, L = 5/5. (11/04/2018); see objective exam (01/06/2019);      Time  8    Period  Weeks    Status  Partially Met    Target Date  03/03/19      PT LONG TERM GOAL #5   Title  Be able to squat to chair height with proper form without limitation due to current condition in order to improve ability to lift and complete transfers during usual activities.     Baseline  Difficulty with sit<>stand transfers due to pain but less than at initial eval (11/28/2018); improved sit <> stand transfers with reduced pain compared to initial eval (12/09/2018);; able to complete partial squat and improved sit to stand but continued pain (01/06/2019);     Time  8    Period  Weeks    Status  On-going    Target Date  03/03/19            Plan - 01/15/19 1213    Clinical Impression Statement  Patient has attended 10 treatment sessions this episode of care. She recently started coming more regularly again following difficulty with attendance due to insurance authorization delays and holiday plans. Overall she was making progress towards goals before having difficulty with attendance and has started making progress towards goals again at this point.. Since starting PT she reports improved activity tolerance, LE strength, and lumbar ROM and  has overall made progress towards most goals. She continues to have deficits in activity tolerance, strength, pain, and range of motion that are limiting her ability to complete her usual ADLs, IADLs, child care tasks, and usual social and community activities without difficulty. She will benefit from continued physical therapy to address remaining impairments and functional deficits and work towards stated goals.    Rehab Potential  Good    Clinical Impairments Affecting Rehab Potential  (+) denies leg symptoms; (-) apparent subjective pain exaggeration, external locus of control, focus on pain.     PT Frequency  2x / week    PT Duration  6 weeks    PT Treatment/Interventions  ADLs/Self Care Home Management;Moist Heat;Cryotherapy;Functional mobility training;Stair training;Therapeutic activities;Therapeutic exercise;Balance training;Neuromuscular re-education;Patient/family education;Manual techniques;Passive range of motion;Dry needling;Spinal Manipulations;Joint Manipulations;Other (comment)    PT Next Visit Plan  progress strengthening and stretching and specific exercise as tolerated/appropriate. Refrain from extension based exercises next visit if pain is low. Continue to monitor new location of pain.     PT Home Exercise Plan  Medbridge  Access Code: [email or web address]    [email or web address] and Agree with Plan of Care  Patient       Patient will benefit from skilled therapeutic intervention in order to improve the following deficits and impairments:  Decreased endurance, Decreased mobility, Difficulty walking, Hypomobility, Increased muscle spasms, Decreased range of motion, Impaired perceived functional ability, Impaired tone, Obesity, Decreased activity tolerance, Decreased strength, Postural dysfunction, Pain, Impaired flexibility  Visit Diagnosis: Chronic bilateral low back pain without  sciatica  Difficulty in walking, not elsewhere classified     Problem List Patient Active Problem List   Diagnosis  Date Noted  . Menorrhagia with regular cycle 03/11/2018  . Microcytic anemia 02/25/2018  . Iron deficiency anemia due to chronic blood loss 02/18/2018    Nancy Nordmann , PT, DPT 01/15/2019, 12:14 PM  [facility] [address] [place], [address] Phone: [phone]   Fax:  [phone]  Name: KAEDE CLENDENEN MRN: [id number] Date of Birth: 11/27/1986

## 2019-01-23 ENCOUNTER — Encounter: Payer: Medicaid Other | Admitting: Physical Therapy

## 2019-01-27 ENCOUNTER — Encounter: Payer: Self-pay | Admitting: Physical Therapy

## 2019-01-27 ENCOUNTER — Ambulatory Visit: Payer: Medicaid Other | Attending: Nurse Practitioner | Admitting: Physical Therapy

## 2019-01-27 DIAGNOSIS — M545 Low back pain: Secondary | ICD-10-CM | POA: Diagnosis present

## 2019-01-27 DIAGNOSIS — G8929 Other chronic pain: Secondary | ICD-10-CM | POA: Diagnosis present

## 2019-01-27 DIAGNOSIS — R262 Difficulty in walking, not elsewhere classified: Secondary | ICD-10-CM

## 2019-01-27 NOTE — Therapy (Signed)
[facility] [address] [place], [address] Phone: [phone]   Fax:  [phone]  Physical Therapy Treatment  Patient Details  Name: Cathy Trevino MRN: [id number] Date of Birth: 03-27-1986 Referring Provider (PT): Margurite Auerbach, NP-C   Encounter Date: 01/27/2019  PT End of Session - 01/27/19 1640    Visit Number  11    Number of Visits  22    Date for PT Re-Evaluation  03/03/19    Authorization Type  Medicaid reporting period from 01/06/2019; auth through 01/19/2019    Authorization Time Period  Current Certification period: 12/07/2019 - 03/03/2019 (last PN 01/06/2019)    Authorization - Visit Number  1    Authorization - Number of Visits  12    PT Start Time  1620    PT Stop Time  1700    PT Time Calculation (min)  40 min    Activity Tolerance  Patient tolerated treatment well;Patient limited by pain;Patient limited by fatigue    Behavior During Therapy  [facility] for tasks assessed/performed       Past Medical History:  Diagnosis Date  . Anemia   . Anxiety   . Asthma   . Depression   . Menorrhagia     Past Surgical History:  Procedure Laterality Date  . CESAREAN SECTION  2012  . TUBAL LIGATION      There were no vitals filed for this visit.  Subjective Assessment - 01/27/19 1620    Subjective  Patient reports she is feeling pretty good today and has been feeling good for the last day or so. She reports she was sore in her legs following last treatment session. She arrives with 2/10 pain at the top of the right iliac crest. She has been using a lidocane patch there that has been helping but she did not wear it today. She feels that her new medication     Pertinent History  Patient is a 33 y.o. female who presents to outpatient physical therapy with a referral for chronic low back pain. This patient's chief complaints consist of pain, leading to the following functional deficits: difficulty with all  activities that require movement or motion including lifting, bending, prolonged positions, sleeping. Relevant past medical history and comorbidities include "musculoskeletal issue around heart" told to take tylenol when needed (sees cardiologist when he feels is necessary, last was July 2018), obesity, previous episodes of low back pain, history of depression and anxiety, anemia. Imaging: From MRI report dated 07/15/2018: "1. 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 2. Diffusely decreased T1 signal throughout the visualized bone marrow, nonspecific, but suspected to be related to patient's history of anemia. 3. Otherwise normal MRI of the lumbar spine."    Limitations  Sitting;Walking;Lifting;House hold activities;Standing    How long can you sit comfortably?  5-6 minutes    How long can you stand comfortably?  5-6 minutes    How long can you walk comfortably?  10 min    Diagnostic tests  MRI report dated 07/15/2018: "1. 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 2. Diffusely decreased T1 signal throughout the visualized bone marrow, nonspecific, but suspected to be related to patient's history of anemia. 3. Otherwise normal MRI of the lumbar spine."    Patient Stated Goals  Get rid  of the pain so she can function better.     Currently in Pain?  Yes    Pain Score  2     Pain Location  Back    Pain Orientation  Right    Pain Descriptors / Indicators  Dull    Pain Type  Chronic pain         OBJECTIVE:  - squats: buttocks taps in chair x 10 with some difficulty due to imbalance while coordinating deeper quat.  TREATMENT:  Denies latex allergy.  Therapeutic exercise:to centralize symptoms and improve ROM and strength required for successful completion of functional activities.  -Treadmill  levelup to1.59mh, nograde.For improved lower extremity mobility, muscular endurance, and weightbearing activity tolerance; and to induce the analgesic effect of aerobic exercise, stimulate improved joint nutrition, and prepare body structures and systems for following interventions. x5Minutes during subjective exam. - Education on HEP including handout  Circuit 1: - Squats with theraband wrapped around knees for isometric abduction to reduce knee valgus, chair behind to improve confidence and reduce fear of falling backwards while working on hip hinge. Cuing for hip hinge, equal weightbearing, neutral to mild genuvarum in bilateral knees, keeping tibias perpendicular to floor to reduce forward translation of knees during movement. X10, x13, x7 -Standing rows with scapular retraction for improved postural and shoulder girdle strengthening and mobility. Required instruction for technique and cuing to retract, posteriorly tilt, and depress scapulae.2x15 at 15#.Cuing to prevent spinal flexion/extension each rep.  - seated lat pull with isolated scapular depression/elevation from pull2 x 15 at 20#with cuing for proper form. -Standing pallof press (multifidus press).  x10 with double green theraband, x 10 with double blue theraband each side. Cuing for trunkactivation and sequencing of exercise. Circuit 2: -Quadruped bird dogwith hip extensionCuing for abdominal brace. 2X 10 each side,plus time for instruction, rest, and transition. -Hooklying dead bug ( knees, and shoulders flexed to 90 starting position then alternating hip/knee extension and contralateral shoulder flexion with abdominal activation).2X 10 each sideplus time for instruction, rest, and transition. cuing for care activation and to keep shins parallel to ground.  HOME EXERCISE PROGRAM [email or web address] URL: [email or web address]  Date: 01/15/2019  Prepared by: SRosita Kea  Exercises   Squat  with Chair Touch - 10-15 reps - 1 second hold - 3 Sets - 1x daily - 7x weekly   Bird Dog - 10-15 reps - 1 second hold - 3 Sets - 1x daily - 7x weekly   Supine Dead Bug with Leg Extension - 10-15 reps - 1 second hold - 3 Sets - 1x daily - 7x weekly   Staggered Stance Single Arm Row with Anchored Resistance - 10-15 reps - 1 second hold - 3 Sets - 1x daily - 3x weekly   Seated Correct Posture   Walking - 30 min hold - 4x weekly     Patient response to treatment: Pt tolerated treatmentwell. She was able to continue trunk and LE strengthening exercises today without provocation of pain. Did not progress exercises due to reports of significant soreness following last treatment session and exercises continuing to be appropriately challening Continues to report right upper lumbar/low thoracic region as her most painful location in her back but very tolerable today during exercise and manual was not applied due to lack of improvement at previous sessions. she did have some increase in pain after last set of squat that dissipated with further exercises. Pt requiredmultimodalcuing for proper technique and to facilitate improved neuromuscular control, strength,  range of motion, and functional abilitywith improved technique by end of session.     PT Education - 01/27/19 1639    Education Details  Exercise purpose/form. Self management techniques. Education on diagnosis, prognosis, POC, anatomy and physiology of current condition    Person(s) Educated  Patient    Methods  Explanation;Demonstration;Tactile cues;Verbal cues    Comprehension  Verbalized understanding;Returned demonstration       PT Short Term Goals - 12/09/18 1408      PT SHORT TERM GOAL #1   Title  Be independent with home exercise program completed at least 3 times per week for self-management of symptoms.    Baseline  Initial HEP provided at initial eval (11/04/2018);    Time  2    Period  Weeks    Status  Achieved     Target Date  11/19/18      PT SHORT TERM GOAL #2   Title  Reduce pain to 8/10 with functional activities to allow patient to complete valued functional tasks such as sit to stand with less difficulty.    Baseline  reports 10/10 at initial evaluation (11/04/2018); 9.5/10 (11/28/2018); 8/10 at night while trying to sleep (12/09/2018)    Time  2    Period  Weeks    Status  Achieved    Target Date  12/12/18      PT SHORT TERM GOAL #3   Title  Improve lumbar AROM to 75% to allow patient to complete valued activities with less difficulty.     Baseline  50% with end range pain at initial evaluaiton (11/04/2018);     Time  2    Period  Weeks    Status  Achieved    Target Date  11/19/18        PT Long Term Goals - 01/06/19 1940      PT LONG TERM GOAL #1   Title  Be independent with a long-term home exercise program for self-management of symptoms.     Baseline  Initial HEP provided at initial eval (11/04/2018); Initial HEP and progressions have been provided but not final progressions (11/28/2018); Initial HEP and progressions have been provided but not final progressions (12/09/2018; 01/06/2019)    Time  8    Period  Weeks    Status  Partially Met    Target Date  03/03/19      PT LONG TERM GOAL #2   Title  Demonstrate improved FOTO score by 10 units to demonstrate improvement in overall condition and self-reported functional ability.     Baseline  Foto = 47 (11/04/2018); too early to re-measure (11/28/2018); not measured (12/09/2018); 44 (01/06/2019);     Time  8    Period  Weeks    Status  On-going    Target Date  03/03/19      PT LONG TERM GOAL #3   Title  Complete community, work and/or recreational activities without limitation due to current condition.     Baseline   limited in daily activities and responsibilities, limited to 5# of lifting, disrupts sleep. 5# weight lift limit (11/04/2018); pt reports increased activty since starting PT, continues to have similar limitations  to baseline (11/28/2018);; patient reports increased activity since strating PT, continues to have similar limitations to baseline but with slightly lower pain (12/09/2018); continues to have similar limitations but with less pain overall (01/06/2019);     Time  8    Period  Weeks    Status  On-going  Target Date  03/03/19      PT LONG TERM GOAL #4   Title  Patient will increase BLE gross strength to 4+/5 as to improve functional strength for independent gait, increased standing tolerance and increased ADL ability.    Baseline  Hip: Abduction: R = 3+/5 painful , L = 4+/5 painful!. Hip Flexion: R = 3+/5, L = 4/5. Knee Flex: R = 4/5, L = 5/5. (11/04/2018); see objective exam (01/06/2019);      Time  8    Period  Weeks    Status  Partially Met    Target Date  03/03/19      PT LONG TERM GOAL #5   Title  Be able to squat to chair height with proper form without limitation due to current condition in order to improve ability to lift and complete transfers during usual activities.     Baseline  Difficulty with sit<>stand transfers due to pain but less than at initial eval (11/28/2018); improved sit <> stand transfers with reduced pain compared to initial eval (12/09/2018);; able to complete partial squat and improved sit to stand but continued pain (01/06/2019);     Time  8    Period  Weeks    Status  On-going    Target Date  03/03/19            Plan - 01/27/19 1650    Clinical Impression Statement   Patient has attended 11 treatment sessions this episode of care. She had an almost 2 week delay in attendance again due to waiting for insurance authorization but is now scheduled for regular attendance for recommended amount of visit. Overall she was making progress towards goals before having difficulty with attendance and has started making progress towards goals again at this point. Since starting PT she reports improved activity tolerance, LE strength, and lumbar ROM and has overall made  progress towards most goals. She continues to have deficits in activity tolerance, strength, pain, and range of motion that are limiting her ability to complete her usual ADLs, IADLs, child care tasks, and usual social and community activities without difficulty. She will benefit from continued physical therapy to address remaining impairments and functional deficits and work towards stated goals.    Rehab Potential  Good    Clinical Impairments Affecting Rehab Potential  (+) denies leg symptoms; (-) apparent subjective pain exaggeration, external locus of control, focus on pain.     PT Frequency  2x / week    PT Duration  6 weeks    PT Treatment/Interventions  ADLs/Self Care Home Management;Moist Heat;Cryotherapy;Functional mobility training;Stair training;Therapeutic activities;Therapeutic exercise;Balance training;Neuromuscular re-education;Patient/family education;Manual techniques;Passive range of motion;Dry needling;Spinal Manipulations;Joint Manipulations;Other (comment)    PT Next Visit Plan  progress strengthening and stretching and specific exercise as tolerated/appropriate. Refrain from extension based exercises next visit if pain is low. Continue to monitor new location of pain.     PT Home Exercise Plan  Medbridge  Access Code: [email or web address]    [email or web address] and Agree with Plan of Care  Patient       Patient will benefit from skilled therapeutic intervention in order to improve the following deficits and impairments:  Decreased endurance, Decreased mobility, Difficulty walking, Hypomobility, Increased muscle spasms, Decreased range of motion, Impaired perceived functional ability, Impaired tone, Obesity, Decreased activity tolerance, Decreased strength, Postural dysfunction, Pain, Impaired flexibility  Visit Diagnosis: Chronic bilateral low back pain without sciatica  Difficulty in walking, not elsewhere classified     Problem List Patient Active  Problem List   Diagnosis Date Noted  .  Menorrhagia with regular cycle 03/11/2018  . Microcytic anemia 02/25/2018  . Iron deficiency anemia due to chronic blood loss 02/18/2018    Nancy Nordmann, PT, DPT 01/27/2019, 4:58 PM  [facility] [address], [place], [address] Phone: [phone]   Fax:  [phone]  Name: LESLEYANNE POLITTE MRN: [id number] Date of Birth: June 14, 1986

## 2019-01-29 ENCOUNTER — Encounter: Payer: Self-pay | Admitting: Physical Therapy

## 2019-01-29 ENCOUNTER — Ambulatory Visit: Payer: Medicaid Other | Admitting: Physical Therapy

## 2019-01-29 DIAGNOSIS — R262 Difficulty in walking, not elsewhere classified: Secondary | ICD-10-CM

## 2019-01-29 DIAGNOSIS — G8929 Other chronic pain: Secondary | ICD-10-CM

## 2019-01-29 DIAGNOSIS — M545 Low back pain: Principal | ICD-10-CM

## 2019-01-29 NOTE — Therapy (Signed)
[facility] [address], [place], [address] Phone: [phone]   Fax:  [phone]  Physical Therapy Treatment  Patient Details  Name: Cathy Trevino MRN: [id number] Date of Birth: [birth year] Referring Provider (PT): Margurite Auerbach, NP-C   Encounter Date: 01/29/2019  PT End of Session - 01/29/19 1300    Visit Number  12    Number of Visits  22    Date for PT Re-Evaluation  03/03/19    Authorization Type  Medicaid reporting period from 01/06/2019; auth through 01/19/2019    Authorization Time Period  Current Certification period: 12/07/2019 - 03/03/2019 (last PN 01/06/2019)    Authorization - Visit Number  2    Authorization - Number of Visits  12    PT Start Time  1300    PT Stop Time  1345    PT Time Calculation (min)  45 min    Activity Tolerance  Patient tolerated treatment well;Patient limited by pain;Patient limited by fatigue    Behavior During Therapy  [facility] for tasks assessed/performed       Past Medical History:  Diagnosis Date  . Anemia   . Anxiety   . Asthma   . Depression   . Menorrhagia     Past Surgical History:  Procedure Laterality Date  . CESAREAN SECTION  2012  . TUBAL LIGATION      There were no vitals filed for this visit.  Subjective Assessment - 01/29/19 1302    Subjective  Patient reports 1/10 pain in the right lumbar region upon arrival (usual spot). She states it i "not too bad." States she was a bit sore there following last treatment session. She feels like everything is going pretty good as far as her HEP. She was feeling some soreness in her thighs but it is better today.     Pertinent History  Patient is a 33 y.o. female who presents to outpatient physical therapy with a referral for chronic low back pain. This patient's chief complaints consist of pain, leading to the following functional deficits: difficulty with all activities that require movement or motion including  lifting, bending, prolonged positions, sleeping. Relevant past medical history and comorbidities include "musculoskeletal issue around heart" told to take tylenol when needed (sees cardiologist when he feels is necessary, last was July 2018), obesity, previous episodes of low back pain, history of depression and anxiety, anemia. Imaging: From MRI report dated 07/15/2018: "1. 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 2. Diffusely decreased T1 signal throughout the visualized bone marrow, nonspecific, but suspected to be related to patient's history of anemia. 3. Otherwise normal MRI of the lumbar spine."    Limitations  Sitting;Walking;Lifting;House hold activities;Standing    How long can you sit comfortably?  5-6 minutes    How long can you stand comfortably?  5-6 minutes    How long can you walk comfortably?  10 min    Diagnostic tests  MRI report dated 07/15/2018: "1. 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 2. Diffusely decreased T1 signal throughout the visualized bone marrow, nonspecific, but suspected to be related to patient's history of anemia. 3. Otherwise normal MRI of the lumbar spine."    Patient Stated Goals  Get rid of the pain so she can function better.  Currently in Pain?  Yes    Pain Score  1     Pain Location  Back    Pain Orientation  Right    Pain Descriptors / Indicators  Dull    Pain Type  Chronic pain       TREATMENT:  Denies latex allergy.  Therapeutic exercise:to centralize symptoms and improve ROM and strength required for successful completion of functional activities.  -Treadmill levelup to2.58mh, nograde.For improved lower extremity mobility, muscular endurance, and weightbearing activity tolerance; and to induce the analgesic effect of aerobic exercise,  stimulate improved joint nutrition, and prepare body structures and systems for following interventions. x5Minutes during subjective exam. Circuit 1: -Squats with chair behind to improve confidence and reduce fear of falling backwards while working on hip hinge. Cuing for hip hinge, equal weightbearing, neutral to mild genuvarum in bilateral knees, keeping tibias perpendicular to floor to reduce forward translation of knees during movement. 3x10. -Standing rows with scapular retraction for improved postural and shoulder girdle strengthening and mobility. Required instruction for technique and cuing to retract, posteriorly tilt, and depress scapulae.2x15 at 20#.Cuing to prevent spinal flexion/extension each rep.  - seated lat pull with isolated scapular depression/elevation from pull2 x 10 at 25#with cuing for proper form. -Standing pallof press (multifidus press). 2x 15 with double blue theraband each side. Cuing for trunkactivation and sequencing of exercise. Circuit 2: - Side stepping with green theraband wrapped around legs and held at umbilicus to activate core and hip stabilizers and abductors in functional weightbearing position. Cuing to activate abdominals. Stepping a few steps out and in as allowed by band 2x2 min plus time for instruction. -Quadruped bird dogwith hip extensionCuing for abdominal brace.2x 10 each side,plus time for instruction, rest, and transition.  - side plank modified to knees. 4x8-10 seconds each side. Cuing for posture. To improve lateral trunk stability.  -supine dead bug (hip, knees flexed to 90 starting position then alternating hip/knee extension and contralateral shoulder flexion with abdominal activation), legs to 45-50 degrees.2X 10 each sideplus time for instruction, rest, and transition. cuing for care activation and to keep shins parallel to ground.  HOME EXERCISE PROGRAM [facility]: [email or web address] URL: [email or web address]   Date: 01/29/2019  Prepared by: SRosita Kea  Exercises  Squat with Chair Touch - 10-15 reps - 1 second hold - 3 Sets - 1x daily - 7x weekly  Bird Dog - 10-15 reps - 1 second hold - 3 Sets - 1x daily - 7x weekly  Supine Dead Bug with Leg Extension - 10-15 reps - 1 second hold - 3 Sets - 1x daily - 7x weekly  Staggered Stance Single Arm Row with Anchored Resistance - 10-15 reps - 1 second hold - 3 Sets - 1x daily - 3x weekly  Seated Correct Posture  Walking - 30 min hold - 4x weekly    Patient response to treatment: Pt tolerated treatmentwell. She was able to continue trunk and LE strengthening exercises today with progression in resistance and repetitions as appropriate. Did not progress squats due to reports of significant soreness following last treatment session. Continues to report right upper lumbar/low thoracic region as her most painful location in her back but very tolerable today during exercise today. Discussed probability of DOMS in the abdominal region and appropriate response. Manual was not applied due to lack of improvement at previous sessions.Pt requiredmultimodalcuing for proper technique and to facilitate improved neuromuscular control, strength, range of motion, and functional abilitywith improved technique by end of session.  PT Education - 01/29/19 1259    Education Details  Exercise purpose/form. Self management techniques. Education on diagnosis, prognosis, POC, anatomy and physiology of current condition    Person(s) Educated  Patient    Methods  Explanation;Demonstration;Tactile cues;Verbal cues    Comprehension  Verbalized understanding;Returned demonstration       PT Short Term Goals - 12/09/18 1408      PT SHORT TERM GOAL #1   Title  Be independent with home exercise program completed at least 3 times per week for self-management of symptoms.    Baseline  Initial HEP provided at initial eval (11/04/2018);    Time  2    Period  Weeks    Status   Achieved    Target Date  11/19/18      PT SHORT TERM GOAL #2   Title  Reduce pain to 8/10 with functional activities to allow patient to complete valued functional tasks such as sit to stand with less difficulty.    Baseline  reports 10/10 at initial evaluation (11/04/2018); 9.5/10 (11/28/2018); 8/10 at night while trying to sleep (12/09/2018)    Time  2    Period  Weeks    Status  Achieved    Target Date  12/12/18      PT SHORT TERM GOAL #3   Title  Improve lumbar AROM to 75% to allow patient to complete valued activities with less difficulty.     Baseline  50% with end range pain at initial evaluaiton (11/04/2018);     Time  2    Period  Weeks    Status  Achieved    Target Date  11/19/18        PT Long Term Goals - 01/06/19 1940      PT LONG TERM GOAL #1   Title  Be independent with a long-term home exercise program for self-management of symptoms.     Baseline  Initial HEP provided at initial eval (11/04/2018); Initial HEP and progressions have been provided but not final progressions (11/28/2018); Initial HEP and progressions have been provided but not final progressions (12/09/2018; 01/06/2019)    Time  8    Period  Weeks    Status  Partially Met    Target Date  03/03/19      PT LONG TERM GOAL #2   Title  Demonstrate improved FOTO score by 10 units to demonstrate improvement in overall condition and self-reported functional ability.     Baseline  Foto = 47 (11/04/2018); too early to re-measure (11/28/2018); not measured (12/09/2018); 44 (01/06/2019);     Time  8    Period  Weeks    Status  On-going    Target Date  03/03/19      PT LONG TERM GOAL #3   Title  Complete community, work and/or recreational activities without limitation due to current condition.     Baseline   limited in daily activities and responsibilities, limited to 5# of lifting, disrupts sleep. 5# weight lift limit (11/04/2018); pt reports increased activty since starting PT, continues to have similar  limitations to baseline (11/28/2018);; patient reports increased activity since strating PT, continues to have similar limitations to baseline but with slightly lower pain (12/09/2018); continues to have similar limitations but with less pain overall (01/06/2019);     Time  8    Period  Weeks    Status  On-going    Target Date  03/03/19      PT LONG TERM GOAL #4  Title  Patient will increase BLE gross strength to 4+/5 as to improve functional strength for independent gait, increased standing tolerance and increased ADL ability.    Baseline  Hip: Abduction: R = 3+/5 painful , L = 4+/5 painful!. Hip Flexion: R = 3+/5, L = 4/5. Knee Flex: R = 4/5, L = 5/5. (11/04/2018); see objective exam (01/06/2019);      Time  8    Period  Weeks    Status  Partially Met    Target Date  03/03/19      PT LONG TERM GOAL #5   Title  Be able to squat to chair height with proper form without limitation due to current condition in order to improve ability to lift and complete transfers during usual activities.     Baseline  Difficulty with sit<>stand transfers due to pain but less than at initial eval (11/28/2018); improved sit <> stand transfers with reduced pain compared to initial eval (12/09/2018);; able to complete partial squat and improved sit to stand but continued pain (01/06/2019);     Time  8    Period  Weeks    Status  On-going    Target Date  03/03/19            Plan - 01/29/19 1355    Clinical Impression Statement  Patient has attended 12  treatment sessions this episode of care. She is showing steady improvement with more regular attendance. Overall she was making progress towards goals before having difficulty with attendance and has started making progress towards goals again at this point. Since starting PT she continues to show improved activity tolerance, LE strength, and lumbar ROM and has overall made progress towards most goals. She continues to have deficits in activity tolerance,  strength, pain, and range of motion that are limiting her ability to complete her usual ADLs, IADLs, child care tasks, and usual social and community activities without difficulty. She will benefit from continued physical therapy to address remaining impairments and functional deficits and work towards stated goals.    Rehab Potential  Good    Clinical Impairments Affecting Rehab Potential  (+) denies leg symptoms; (-) apparent subjective pain exaggeration, external locus of control, focus on pain.     PT Frequency  2x / week    PT Duration  6 weeks    PT Treatment/Interventions  ADLs/Self Care Home Management;Moist Heat;Cryotherapy;Functional mobility training;Stair training;Therapeutic activities;Therapeutic exercise;Balance training;Neuromuscular re-education;Patient/family education;Manual techniques;Passive range of motion;Dry needling;Spinal Manipulations;Joint Manipulations;Other (comment)    PT Next Visit Plan  progress strengthening and stretching and specific exercise as tolerated/appropriate.     PT Home Exercise Plan  Medbridge  Access Code: [email or web address]    [email or web address] and Agree with Plan of Care  Patient       Patient will benefit from skilled therapeutic intervention in order to improve the following deficits and impairments:  Decreased endurance, Decreased mobility, Difficulty walking, Hypomobility, Increased muscle spasms, Decreased range of motion, Impaired perceived functional ability, Impaired tone, Obesity, Decreased activity tolerance, Decreased strength, Postural dysfunction, Pain, Impaired flexibility  Visit Diagnosis: Chronic bilateral low back pain without sciatica  Difficulty in walking, not elsewhere classified     Problem List Patient Active Problem List   Diagnosis Date Noted  . Menorrhagia with regular cycle 03/11/2018  . Microcytic anemia 02/25/2018  . Iron deficiency anemia due to chronic blood loss 02/18/2018    Nancy Nordmann, PT, DPT  01/29/2019, 1:57  PM  [facility] [address] [place], [address] Phone: [phone]   Fax:  [phone]  Name: KALIEGH WILLADSEN MRN: [id number] Date of Birth: 1986-09-09

## 2019-02-03 ENCOUNTER — Ambulatory Visit: Payer: Medicaid Other | Admitting: Physical Therapy

## 2019-02-05 ENCOUNTER — Ambulatory Visit: Payer: Medicaid Other | Admitting: Physical Therapy

## 2019-02-05 ENCOUNTER — Encounter: Payer: Self-pay | Admitting: Physical Therapy

## 2019-02-05 DIAGNOSIS — M545 Low back pain, unspecified: Secondary | ICD-10-CM

## 2019-02-05 DIAGNOSIS — R262 Difficulty in walking, not elsewhere classified: Secondary | ICD-10-CM

## 2019-02-05 DIAGNOSIS — G8929 Other chronic pain: Secondary | ICD-10-CM

## 2019-02-05 NOTE — Therapy (Signed)
[facility] [address] [place], [address] Phone: [phone]   Fax:  [phone]  Physical Therapy Treatment  Patient Details  Name: Cathy Trevino MRN: [id number] Date of Birth: 07-02-1986 Referring Provider (PT): Cathy Auerbach, NP-C   Encounter Date: 02/05/2019  PT End of Session - 02/05/19 1021    Visit Number  13    Number of Visits  22    Date for PT Re-Evaluation  03/03/19    Authorization Type  Medicaid reporting period from 01/06/2019; auth through 01/19/2019    Authorization Time Period  Current Certification period: 12/07/2019 - 03/03/2019 (last PN 01/06/2019)    Authorization - Visit Number  3    Authorization - Number of Visits  12    PT Start Time  1010    PT Stop Time  1055    PT Time Calculation (min)  45 min    Activity Tolerance  Patient tolerated treatment well;Patient limited by pain;Patient limited by fatigue    Behavior During Therapy  [facility] for tasks assessed/performed       Past Medical History:  Diagnosis Date  . Anemia   . Anxiety   . Asthma   . Depression   . Menorrhagia     Past Surgical History:  Procedure Laterality Date  . CESAREAN SECTION  2012  . TUBAL LIGATION      There were no vitals filed for this visit.  Subjective Assessment - 02/05/19 1014    Subjective  Patient reports her pain is not too bad today 2/10 pain in the low back. Her mother is in the hospital after appendectomy but she may have kidney stones and her pain is not solved and she is having difficulty still. Pt has had quite a lot of tres lately. Felt okay after last treatment session.  She has been doing her HEP regularly.  She is on a z-pac for a head cold and has been taking the medication since last night. Has not had a fever or vomiting.     Pertinent History  Patient is a 33 y.o. female who presents to outpatient physical therapy with a referral for chronic low back pain. This patient's chief  complaints consist of pain, leading to the following functional deficits: difficulty with all activities that require movement or motion including lifting, bending, prolonged positions, sleeping. Relevant past medical history and comorbidities include "musculoskeletal issue around heart" told to take tylenol when needed (sees cardiologist when he feels is necessary, last was July 2018), obesity, previous episodes of low back pain, history of depression and anxiety, anemia. Imaging: From MRI report dated 07/15/2018: "1. 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 2. Diffusely decreased T1 signal throughout the visualized bone marrow, nonspecific, but suspected to be related to patient's history of anemia. 3. Otherwise normal MRI of the lumbar spine."    Limitations  Sitting;Walking;Lifting;House hold activities;Standing    How long can you sit comfortably?  5-6 minutes    How long can you stand comfortably?  5-6 minutes    How long can you walk comfortably?  10 min    Diagnostic tests  MRI report dated 07/15/2018: "1. 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 2. Diffusely decreased T1 signal throughout the visualized bone marrow, nonspecific, but suspected to be related to  patient's history of anemia. 3. Otherwise normal MRI of the lumbar spine."    Patient Stated Goals  Get rid of the pain so she can function better.     Currently in Pain?  Yes    Pain Score  2     Pain Location  Back    Pain Orientation  Right    Pain Descriptors / Indicators  Dull    Pain Type  Chronic pain        TREATMENT:  Denies latex allergy.  Therapeutic exercise:to centralize symptoms and improve ROM and strength required for successful completion of functional activities.  -Treadmill levelup to2.35mh,  nograde.For improved lower extremity mobility, muscular endurance, and weightbearing activity tolerance; and to induce the analgesic effect of aerobic exercise, stimulate improved joint nutrition, and prepare body structures and systems for following interventions. x5Minutes during subjective exam. -Standing pallof press (multifidus press) with rotation.x 15 with double blue therabandeach side. Cuing for trunkactivation and sequencing of exercise.  Circuit 1: -Squats with chair behind to improve confidence and reduce fear of falling backwards while working on hip hinge. Cuing for hip hinge, equal weightbearing, neutral to mild genuvarum in bilateral knees, keeping tibias perpendicular to floor to reduce forward translation of knees during movement. 3x12. -Standing rows with scapular retraction for improved postural and shoulder girdle strengthening and mobility. Required instruction for technique and cuing to retract, posteriorly tilt, and depress scapulae.x10, x12 at 30#.Cuing to prevent spinal flexion/extension each rep.  - seated lat pull with isolated scapular depression/elevation from pullx 10, x12,  at 30#with cuing for proper form.  -  Chops and scoops with green theraband, 2x10 each direction. To improve trunk strength for rotational activities. Cuing for form and technique. Circuit 2: (only one time through this visit) - Side stepping with green theraband wrapped around legs and held at umbilicus to activate core and hip stabilizers and abductors in functional weightbearing position. Cuing to activate abdominals. Stepping a few steps out and in as allowed by band 2x2 min plus time for instruction. -Quadruped bird dogwith hip extensionCuing for abdominal brace.x 15 each side,plus time for instruction, rest, and transition.  - side plank modified to knees. 4x8-10 seconds each side. Cuing for posture. To improve lateral trunk stability.  -supine dead bug (hip, knees flexed to  90 starting position then alternating hip/knee extension and contralateral shoulder flexion with abdominal activation), theraball between hands and knees, legs to 45-50 degrees.X 10 each sideplus time for instruction, rest, and transition. cuing for care activation and to keep shins parallel to ground.  HOME EXERCISE PROGRAM Access Code: [email or web address] URL: [email or web address]  Date: 02/05/2019  Prepared by: SRosita Kea  Exercises  Squat with Chair Touch - 10-15 reps - 1 second hold - 3 Sets - 1x daily - 7x weekly  Bird Dog - 10-15 reps - 1 second hold - 3 Sets - 1x daily - 7x weekly  Dead Bug with Swiss Ball - 10-15 reps - 1 second hold - 3 Sets - 1x daily - 7x weekly  Side Plank on Knees - 10-15 reps - 1 second hold - 3 Sets - 1x daily - 3x weekly  Staggered Stance Single Arm Row with Anchored Resistance - 10-15 reps - 1 second hold - 3 Sets - 1x daily - 3x weekly  Seated Correct Posture  Walking - 30 min hold - 4x weekly   Patient response to treatment: Pt tolerated treatmentwell. She was able tocontinuetrunk and LE strengthening exercises today with progression  in resistance and repetitions as appropriate.Continues to report right upper lumbar/low thoracic region as her most painful location in her back but continues to be very tolerable today during exercise and reports no severe flairs in the last two weeks.  Manual was not applied due to lack of improvement at previous sessions.Pt requiredmultimodalcuing for proper technique and to facilitate improved neuromuscular control, strength, range of motion, and functional abilitywith improved technique by end of session.    PT Education - 02/05/19 1023    Education Details  Exercise purpose/form. Self management techniques. Education on diagnosis, prognosis, POC, anatomy and physiology of current condition    Person(s) Educated  Patient    Methods  Explanation;Demonstration;Tactile cues;Verbal cues     Comprehension  Verbalized understanding;Returned demonstration       PT Short Term Goals - 12/09/18 1408      PT SHORT TERM GOAL #1   Title  Be independent with home exercise program completed at least 3 times per week for self-management of symptoms.    Baseline  Initial HEP provided at initial eval (11/04/2018);    Time  2    Period  Weeks    Status  Achieved    Target Date  11/19/18      PT SHORT TERM GOAL #2   Title  Reduce pain to 8/10 with functional activities to allow patient to complete valued functional tasks such as sit to stand with less difficulty.    Baseline  reports 10/10 at initial evaluation (11/04/2018); 9.5/10 (11/28/2018); 8/10 at night while trying to sleep (12/09/2018)    Time  2    Period  Weeks    Status  Achieved    Target Date  12/12/18      PT SHORT TERM GOAL #3   Title  Improve lumbar AROM to 75% to allow patient to complete valued activities with less difficulty.     Baseline  50% with end range pain at initial evaluaiton (11/04/2018);     Time  2    Period  Weeks    Status  Achieved    Target Date  11/19/18        PT Long Term Goals - 01/06/19 1940      PT LONG TERM GOAL #1   Title  Be independent with a long-term home exercise program for self-management of symptoms.     Baseline  Initial HEP provided at initial eval (11/04/2018); Initial HEP and progressions have been provided but not final progressions (11/28/2018); Initial HEP and progressions have been provided but not final progressions (12/09/2018; 01/06/2019)    Time  8    Period  Weeks    Status  Partially Met    Target Date  03/03/19      PT LONG TERM GOAL #2   Title  Demonstrate improved FOTO score by 10 units to demonstrate improvement in overall condition and self-reported functional ability.     Baseline  Foto = 47 (11/04/2018); too early to re-measure (11/28/2018); not measured (12/09/2018); 44 (01/06/2019);     Time  8    Period  Weeks    Status  On-going    Target Date   03/03/19      PT LONG TERM GOAL #3   Title  Complete community, work and/or recreational activities without limitation due to current condition.     Baseline   limited in daily activities and responsibilities, limited to 5# of lifting, disrupts sleep. 5# weight lift limit (11/04/2018); pt reports increased  activty since starting PT, continues to have similar limitations to baseline (11/28/2018);; patient reports increased activity since strating PT, continues to have similar limitations to baseline but with slightly lower pain (12/09/2018); continues to have similar limitations but with less pain overall (01/06/2019);     Time  8    Period  Weeks    Status  On-going    Target Date  03/03/19      PT LONG TERM GOAL #4   Title  Patient will increase BLE gross strength to 4+/5 as to improve functional strength for independent gait, increased standing tolerance and increased ADL ability.    Baseline  Hip: Abduction: R = 3+/5 painful , L = 4+/5 painful!. Hip Flexion: R = 3+/5, L = 4/5. Knee Flex: R = 4/5, L = 5/5. (11/04/2018); see objective exam (01/06/2019);      Time  8    Period  Weeks    Status  Partially Met    Target Date  03/03/19      PT LONG TERM GOAL #5   Title  Be able to squat to chair height with proper form without limitation due to current condition in order to improve ability to lift and complete transfers during usual activities.     Baseline  Difficulty with sit<>stand transfers due to pain but less than at initial eval (11/28/2018); improved sit <> stand transfers with reduced pain compared to initial eval (12/09/2018);; able to complete partial squat and improved sit to stand but continued pain (01/06/2019);     Time  8    Period  Weeks    Status  On-going    Target Date  03/03/19            Plan - 02/05/19 1203    Clinical Impression Statement   Patient's attendance was disrupted last week by her mother being hospitalized but she has continued doing her HEP faithfully  and is continuing to show steady improvement overall. Since starting PT she continues to show improved activity tolerance, LE strength, and lumbar ROM and has overall made progress towards most goals. She continues to have deficits in activity tolerance, strength, pain, and range of motion that are limiting her ability to complete her usual ADLs, IADLs, child care tasks, and usual social and community activities without difficulty. She will benefit from continued physical therapy to address remaining impairments and functional deficits and work towards stated goals.    Rehab Potential  Good    Clinical Impairments Affecting Rehab Potential  (+) denies leg symptoms; (-) apparent subjective pain exaggeration, external locus of control, focus on pain.     PT Frequency  2x / week    PT Duration  6 weeks    PT Treatment/Interventions  ADLs/Self Care Home Management;Moist Heat;Cryotherapy;Functional mobility training;Stair training;Therapeutic activities;Therapeutic exercise;Balance training;Neuromuscular re-education;Patient/family education;Manual techniques;Passive range of motion;Dry needling;Spinal Manipulations;Joint Manipulations;Other (comment)    PT Next Visit Plan  progress strengthening and stretching and specific exercise as tolerated/appropriate.     PT Home Exercise Plan  [employer]  Access Code: [email or web address]    [email or web address] and Agree with Plan of Care  Patient       Patient will benefit from skilled therapeutic intervention in order to improve the following deficits and impairments:  Decreased endurance, Decreased mobility, Difficulty walking, Hypomobility, Increased muscle spasms, Decreased range of motion, Impaired perceived functional ability, Impaired tone, Obesity, Decreased activity tolerance, Decreased strength, Postural dysfunction, Pain, Impaired flexibility  Visit Diagnosis: Chronic bilateral low back pain without sciatica  Difficulty in walking, not elsewhere classified     Problem  List Patient Active Problem List   Diagnosis Date Noted  . Menorrhagia with regular cycle 03/11/2018  . Microcytic anemia 02/25/2018  . Iron deficiency anemia due to chronic blood loss 02/18/2018    Nancy Nordmann, PT, DPT 02/05/2019, 12:03 PM  [facility] [address], [place], [address] Phone: [phone]   Fax:  [phone]  Name: Cathy Trevino MRN: [id number] Date of Birth: August 21, 1986

## 2019-02-10 ENCOUNTER — Ambulatory Visit: Payer: Medicaid Other | Admitting: Physical Therapy

## 2019-02-12 ENCOUNTER — Ambulatory Visit: Payer: Medicaid Other | Admitting: Physical Therapy

## 2019-02-12 DIAGNOSIS — M545 Low back pain, unspecified: Secondary | ICD-10-CM

## 2019-02-12 DIAGNOSIS — G8929 Other chronic pain: Secondary | ICD-10-CM

## 2019-02-12 DIAGNOSIS — R262 Difficulty in walking, not elsewhere classified: Secondary | ICD-10-CM

## 2019-02-12 NOTE — Therapy (Signed)
[facility] [address] [place], [address] Phone: [phone]   Fax:  [phone]  Physical Therapy Treatment  Patient Details  Name: Cathy Trevino MRN: [id number] Date of Birth: [birth year] Referring Provider (PT): Margurite Auerbach, NP-C   Encounter Date: 02/12/2019  PT End of Session - 02/12/19 1617    Visit Number  14    Number of Visits  22    Date for PT Re-Evaluation  03/03/19    Authorization Type  Medicaid reporting period from 01/06/2019; auth through 01/19/2019    Authorization Time Period  Current Certification period: 12/07/2019 - 03/03/2019 (last PN 01/06/2019)    Authorization - Visit Number  4    Authorization - Number of Visits  12    PT Start Time  1610    PT Stop Time  1650    PT Time Calculation (min)  40 min    Activity Tolerance  Patient tolerated treatment well;Patient limited by pain;Patient limited by fatigue    Behavior During Therapy  [facility] for tasks assessed/performed       Past Medical History:  Diagnosis Date  . Anemia   . Anxiety   . Asthma   . Depression   . Menorrhagia     Past Surgical History:  Procedure Laterality Date  . CESAREAN SECTION  2012  . TUBAL LIGATION      There were no vitals filed for this visit.  Subjective Assessment - 02/12/19 1614    Subjective  Patient reports her pain is slightly elevated 4/10 in the right low back. She missed her last appointment due to needing to take her mother to see pulmonologist. She was a bit sore after last treatment sessions. She has been doing HEP but she is not yet able ot get to 10 secons on the side plank.     Pertinent History  Patient is a 33 y.o. female who presents to outpatient physical therapy with a referral for chronic low back pain. This patient's chief complaints consist of pain, leading to the following functional deficits: difficulty with all activities that require movement or motion including lifting,  bending, prolonged positions, sleeping. Relevant past medical history and comorbidities include "musculoskeletal issue around heart" told to take tylenol when needed (sees cardiologist when he feels is necessary, last was July 2018), obesity, previous episodes of low back pain, history of depression and anxiety, anemia. Imaging: From MRI report dated 07/15/2018: "1. 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 2. Diffusely decreased T1 signal throughout the visualized bone marrow, nonspecific, but suspected to be related to patient's history of anemia. 3. Otherwise normal MRI of the lumbar spine."    Limitations  Sitting;Walking;Lifting;House hold activities;Standing    How long can you sit comfortably?  5-6 minutes    How long can you stand comfortably?  5-6 minutes    How long can you walk comfortably?  10 min    Diagnostic tests  MRI report dated 07/15/2018: "1. 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 2. Diffusely decreased T1 signal throughout the visualized bone marrow, nonspecific, but suspected to be related to patient's history of anemia. 3. Otherwise normal MRI of the lumbar spine."    Patient Stated Goals  Get rid of the pain so she can function better.  Currently in Pain?  Yes    Pain Score  4     Pain Location  Back    Pain Orientation  Right    Pain Descriptors / Indicators  Dull    Pain Type  Chronic pain    Pain Radiating Towards  right hip from back      TREATMENT:  Denies latex allergy.  Therapeutic exercise:to centralize symptoms and improve ROM and strength required for successful completion of functional activities.  -Treadmill levelup to2.67mh, nograde.For improved lower extremity mobility, muscular endurance, and weightbearing activity tolerance; and to induce the  analgesic effect of aerobic exercise, stimulate improved joint nutrition, and prepare body structures and systems for following interventions. x5Minutes during subjective exam. - seated right thoracolumbar rotation with self overpressure then clinician OP to attempt to decrease pain but it just elevated it. Able to do 10 self overpressure and 3 with clinician OP. Discontinued.  Circuit 1: -Squatswithchair behind to improve confidence and reduce fear of falling backwards while working on hip hinge. Cuing for hip hinge, equal weightbearing, neutral to mild genuvarum in bilateral knees, keeping tibias perpendicular to floor to reduce forward translation of knees during movement.x9, x10  -Standing rows with scapular retraction for improved postural and shoulder girdle strengthening and mobility. Required instruction for technique and cuing to retract, posteriorly tilt, and depress scapulae.2x10, at25#.Cuing to prevent spinal flexion/extension each rep.  - seated lat pull with isolated scapular depression/elevation from pull2x 10, at 25#with cuing for proper form.  -Standing pallof press (multifidus press).2x10with single blue therabandeach side. Cuing for trunkactivation and sequencing of exercise.  Circuit 2: (only one time through this visit) -Side stepping withgreentheraband wrapped around legs and held at umbilicus to activate core and hip stabilizers and abductors in functional weightbearing position. Cuing to activate abdominals. Stepping a few steps out and in as allowed by band 2x2 min plus time for instruction. -Quadruped bird dogwith hip extensionCuing for abdominal brace.2x10 each side,plus time for instruction, rest, and transition.  -supinedead bug (hip,kneesflexed to90 starting position then alternating hip/knee extension and contralateral shoulder flexion with abdominal activation), legs to 45-50 degrees.2X 10 each sideplus time for instruction, rest, and  transition. cuing for care activation and to keep shins parallel to ground.  HOME EXERCISE PROGRAM Access Code: [facility] URL: [email or web address]  Date: 02/05/2019  Prepared by: SRosita Kea  Exercises   Squat with Chair Touch - 10-15 reps - 1 second hold - 3 Sets - 1x daily - 7x weekly   Bird Dog - 10-15 reps - 1 second hold - 3 Sets - 1x daily - 7x weekly   Dead Bug with Swiss Ball - 10-15 reps - 1 second hold - 3 Sets - 1x daily - 7x weekly   Side Plank on Knees - 10-15 reps - 1 second hold - 3 Sets - 1x daily - 3x weekly   Staggered Stance Single Arm Row with Anchored Resistance - 10-15 reps - 1 second hold - 3 Sets - 1x daily - 3x weekly   Seated Correct Posture   Walking - 30 min hold - 4x weekly     PT Education - 02/12/19 1617    Education Details  Exercise purpose/form. Self management techniques. Education on diagnosis, prognosis, POC, anatomy and physiology of current condition    Person(s) Educated  Patient    Methods  Explanation;Demonstration;Tactile cues;Verbal cues    Comprehension  Verbalized understanding;Returned demonstration       PT Short Term Goals - 12/09/18 1408  PT SHORT TERM GOAL #1   Title  Be independent with home exercise program completed at least 3 times per week for self-management of symptoms.    Baseline  Initial HEP provided at initial eval (11/04/2018);    Time  2    Period  Weeks    Status  Achieved    Target Date  11/19/18      PT SHORT TERM GOAL #2   Title  Reduce pain to 8/10 with functional activities to allow patient to complete valued functional tasks such as sit to stand with less difficulty.    Baseline  reports 10/10 at initial evaluation (11/04/2018); 9.5/10 (11/28/2018); 8/10 at night while trying to sleep (12/09/2018)    Time  2    Period  Weeks    Status  Achieved    Target Date  12/12/18      PT SHORT TERM GOAL #3   Title  Improve lumbar AROM to 75% to allow patient to complete valued  activities with less difficulty.     Baseline  50% with end range pain at initial evaluaiton (11/04/2018);     Time  2    Period  Weeks    Status  Achieved    Target Date  11/19/18        PT Long Term Goals - 01/06/19 1940      PT LONG TERM GOAL #1   Title  Be independent with a long-term home exercise program for self-management of symptoms.     Baseline  Initial HEP provided at initial eval (11/04/2018); Initial HEP and progressions have been provided but not final progressions (11/28/2018); Initial HEP and progressions have been provided but not final progressions (12/09/2018; 01/06/2019)    Time  8    Period  Weeks    Status  Partially Met    Target Date  03/03/19      PT LONG TERM GOAL #2   Title  Demonstrate improved FOTO score by 10 units to demonstrate improvement in overall condition and self-reported functional ability.     Baseline  Foto = 47 (11/04/2018); too early to re-measure (11/28/2018); not measured (12/09/2018); 44 (01/06/2019);     Time  8    Period  Weeks    Status  On-going    Target Date  03/03/19      PT LONG TERM GOAL #3   Title  Complete community, work and/or recreational activities without limitation due to current condition.     Baseline   limited in daily activities and responsibilities, limited to 5# of lifting, disrupts sleep. 5# weight lift limit (11/04/2018); pt reports increased activty since starting PT, continues to have similar limitations to baseline (11/28/2018);; patient reports increased activity since strating PT, continues to have similar limitations to baseline but with slightly lower pain (12/09/2018); continues to have similar limitations but with less pain overall (01/06/2019);     Time  8    Period  Weeks    Status  On-going    Target Date  03/03/19      PT LONG TERM GOAL #4   Title  Patient will increase BLE gross strength to 4+/5 as to improve functional strength for independent gait, increased standing tolerance and increased ADL  ability.    Baseline  Hip: Abduction: R = 3+/5 painful , L = 4+/5 painful!. Hip Flexion: R = 3+/5, L = 4/5. Knee Flex: R = 4/5, L = 5/5. (11/04/2018); see objective exam (01/06/2019);  Time  8    Period  Weeks    Status  Partially Met    Target Date  03/03/19      PT LONG TERM GOAL #5   Title  Be able to squat to chair height with proper form without limitation due to current condition in order to improve ability to lift and complete transfers during usual activities.     Baseline  Difficulty with sit<>stand transfers due to pain but less than at initial eval (11/28/2018); improved sit <> stand transfers with reduced pain compared to initial eval (12/09/2018);; able to complete partial squat and improved sit to stand but continued pain (01/06/2019);     Time  8    Period  Weeks    Status  On-going    Target Date  03/03/19            Plan - 02/12/19 1751    Clinical Impression Statement  Pt tolerated treatmentfair. She had difficulty with all exercises today due to increased pain that gradually got worse with squats and attempted rotational stretch to reduce pain. It increased overall from 4/10 to 6/10 by end of session, but she wanted to continue. Exercise intensity was modified to decrease intensity in some exercises.  Nothing was found today or in the past that actually brings down her pain during her visit when she arrives with pain but she still benefits from strengthening exercises.Continues to report right upper lumbar region as her most painful location in her back.  Manual was limited not applied due to lack of improvement at previous sessions.Pt requiredmultimodalcuing for proper technique and to facilitate improved neuromuscular control, strength, range of motion, and functional abilitywith improved technique by end of session.She will benefit from continued physical therapy to address remaining impairments and functional deficits and work towards stated goals.    Rehab  Potential  Good    Clinical Impairments Affecting Rehab Potential  (+) denies leg symptoms; (-) apparent subjective pain exaggeration, external locus of control, focus on pain.     PT Frequency  2x / week    PT Duration  6 weeks    PT Treatment/Interventions  ADLs/Self Care Home Management;Moist Heat;Cryotherapy;Functional mobility training;Stair training;Therapeutic activities;Therapeutic exercise;Balance training;Neuromuscular re-education;Patient/family education;Manual techniques;Passive range of motion;Dry needling;Spinal Manipulations;Joint Manipulations;Other (comment)    PT Next Visit Plan  progress strengthening and stretching and specific exercise as tolerated/appropriate.     PT Home Exercise Plan  Med[employer]  Access Code: [email or web address]    MPNTIRWER and Agree with Plan of Care  Patient       Patient will benefit from skilled therapeutic intervention in order to improve the following deficits and impairments:  Decreased endurance, Decreased mobility, Difficulty walking, Hypomobility, Increased muscle spasms, Decreased range of motion, Impaired perceived functional ability, Impaired tone, Obesity, Decreased activity tolerance, Decreased strength, Postural dysfunction, Pain, Impaired flexibility  Visit Diagnosis: Chronic bilateral low back pain without sciatica  Difficulty in walking, not elsewhere classified     Problem List Patient Active Problem List   Diagnosis Date Noted  . Menorrhagia with regular cycle 03/11/2018  . Microcytic anemia 02/25/2018  . Iron deficiency anemia due to chronic blood loss 02/18/2018    Nancy Nordmann, PT, DPT 02/12/2019, 5:51 PM  [facility] [address] [place], [address] Phone: [phone]   Fax:  [phone]  Name: KINNEDY MONGIELLO MRN: [id number] Date of Birth: [birth year]

## 2019-02-13 ENCOUNTER — Encounter: Payer: Self-pay | Admitting: Physical Therapy

## 2019-02-13 ENCOUNTER — Ambulatory Visit: Payer: Medicaid Other | Admitting: Physical Therapy

## 2019-02-13 DIAGNOSIS — G8929 Other chronic pain: Secondary | ICD-10-CM

## 2019-02-13 DIAGNOSIS — M545 Low back pain: Principal | ICD-10-CM

## 2019-02-13 DIAGNOSIS — R262 Difficulty in walking, not elsewhere classified: Secondary | ICD-10-CM

## 2019-02-13 NOTE — Therapy (Signed)
[facility] [address], [place], [address] Phone: [phone]   Fax:  [phone]  Physical Therapy Treatment  Patient Details  Name: Cathy Trevino MRN: [id number] Date of Birth: Dec 30, 1985 Referring Provider (PT): Margurite Auerbach, NP-C   Encounter Date: 02/13/2019  PT End of Session - 02/13/19 1122    Visit Number  15    Number of Visits  22    Date for PT Re-Evaluation  03/03/19    Authorization Type  Medicaid reporting period from 01/06/2019; auth through 01/19/2019    Authorization Time Period  Current Certification period: 12/07/2019 - 03/03/2019 (last PN 01/06/2019)    Authorization - Visit Number  5    Authorization - Number of Visits  12    PT Start Time  1115    PT Stop Time  1200    PT Time Calculation (min)  45 min    Activity Tolerance  Patient tolerated treatment well;Patient limited by pain;Patient limited by fatigue    Behavior During Therapy  [facility] for tasks assessed/performed       Past Medical History:  Diagnosis Date  . Anemia   . Anxiety   . Asthma   . Depression   . Menorrhagia     Past Surgical History:  Procedure Laterality Date  . CESAREAN SECTION  2012  . TUBAL LIGATION      There were no vitals filed for this visit.  Subjective Assessment - 02/13/19 1118    Subjective  Patient reports she feels much better today at 2/10 pain. Her pain remained elevated after her treatment session yesterday until she went to bed. She awoke feeling better.  She was here yesterday and did not complete her HEP.     Pertinent History  Patient is a 33 y.o. female who presents to outpatient physical therapy with a referral for chronic low back pain. This patient's chief complaints consist of pain, leading to the following functional deficits: difficulty with all activities that require movement or motion including lifting, bending, prolonged positions, sleeping. Relevant past medical history and  comorbidities include "musculoskeletal issue around heart" told to take tylenol when needed (sees cardiologist when he feels is necessary, last was July 2018), obesity, previous episodes of low back pain, history of depression and anxiety, anemia. Imaging: From MRI report dated 07/15/2018: "1. 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 2. Diffusely decreased T1 signal throughout the visualized bone marrow, nonspecific, but suspected to be related to patient's history of anemia. 3. Otherwise normal MRI of the lumbar spine."    Limitations  Sitting;Walking;Lifting;House hold activities;Standing    How long can you sit comfortably?  5-6 minutes    How long can you stand comfortably?  5-6 minutes    How long can you walk comfortably?  10 min    Diagnostic tests  MRI report dated 07/15/2018: "1. 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 2. Diffusely decreased T1 signal throughout the visualized bone marrow, nonspecific, but suspected to be related to patient's history of anemia. 3. Otherwise normal MRI of the lumbar spine."    Patient Stated Goals  Get rid of the pain so she can function better.     Currently in Pain?  Yes    Pain Score  2     Pain Location  Back    Pain Orientation  Right    Pain Type  Chronic pain    Pain Radiating Towards  right hip from back       TREATMENT:  Denies latex allergy.  Therapeutic exercise:to centralize symptoms and improve ROM and strength required for successful completion of functional activities.  -Treadmill levelup to2.43mh, nograde.For improved lower extremity mobility, muscular endurance, and weightbearing activity tolerance; and to induce the analgesic effect of aerobic exercise, stimulate improved joint nutrition, and prepare body structures and systems  for following interventions. x5Minutes during subjective exam. Circuit 1: -Squatswithchair behind to improve confidence and reduce fear of falling backwards while working on hip hinge. Cuing for hip hinge, equal weightbearing, neutral to mild genuvarum in bilateral knees, keeping tibias perpendicular to floor to reduce forward translation of knees during movement.x10, x15, x15. -Standing rows with scapular retraction for improved postural and shoulder girdle strengthening and mobility. Required instruction for technique and cuing to retract, posteriorly tilt, and depress scapulae.x10, x11, at25#.Cuing to prevent spinal flexion/extension each rep.  - seated lat pull with isolated scapular depression/elevation from pullx 10, x15,at25#with cuing for proper form. -Standing pallof press (multifidus press).x10, x15with single blue therabandeach side. Cuing for trunkactivation and sequencing of exercise.   Circuit 2: -Side stepping withgreentheraband wrapped around legs and held at umbilicus to activate core and hip stabilizers and abductors in functional weightbearing position. Cuing to activate abdominals. Stepping a few steps out and in as allowed by band 2x2 min plus time for instruction. -Quadruped bird dogwith hip extensionCuing for abdominal brace.2x15each side,plus time for instruction, rest, and transition.  -supinedead bug (hip,kneesflexed to90 starting position then alternating hip/knee extension and contralateral shoulder flexion with abdominal activation),legs to 45-50 degrees.2X15each sideplus time for instruction, rest, and transition. cuing for care activation and to keep shins parallel to ground.  HOME EXERCISE PROGRAM Access Code: [email or web address] URL: [email or web address]  Date: 02/05/2019  Prepared by: SRosita Kea  Exercises   Squat with Chair Touch - 10-15 reps - 1 second hold - 3 Sets - 1x daily - 7x weekly   Bird Dog -  10-15 reps - 1 second hold - 3 Sets - 1x daily - 7x weekly   Dead Bug with Swiss Ball - 10-15 reps - 1 second hold - 3 Sets - 1x daily - 7x weekly   Side Plank on Knees - 10-15 reps - 1 second hold - 3 Sets - 1x daily - 3x weekly   Staggered Stance Single Arm Row with Anchored Resistance - 10-15 reps - 1 second hold - 3 Sets - 1x daily - 3x weekly   Seated Correct Posture   Walking - 30 min hold - 4x weekly   PT Education - 02/13/19 1121    Education Details  Exercise purpose/form. Self management techniques. Education on diagnosis, prognosis, POC, anatomy and physiology of current condition    Person(s) Educated  Patient    Methods  Explanation;Demonstration;Tactile cues;Verbal cues    Comprehension  Verbalized understanding;Returned demonstration       PT Short Term Goals - 12/09/18 1408      PT SHORT TERM GOAL #1   Title  Be independent with home exercise program completed at least 3 times per week for self-management of symptoms.    Baseline  Initial HEP provided at initial eval (11/04/2018);    Time  2    Period  Weeks    Status  Achieved    Target Date  11/19/18  PT SHORT TERM GOAL #2   Title  Reduce pain to 8/10 with functional activities to allow patient to complete valued functional tasks such as sit to stand with less difficulty.    Baseline  reports 10/10 at initial evaluation (11/04/2018); 9.5/10 (11/28/2018); 8/10 at night while trying to sleep (12/09/2018)    Time  2    Period  Weeks    Status  Achieved    Target Date  12/12/18      PT SHORT TERM GOAL #3   Title  Improve lumbar AROM to 75% to allow patient to complete valued activities with less difficulty.     Baseline  50% with end range pain at initial evaluaiton (11/04/2018);     Time  2    Period  Weeks    Status  Achieved    Target Date  11/19/18        PT Long Term Goals - 01/06/19 1940      PT LONG TERM GOAL #1   Title  Be independent with a long-term home exercise program for  self-management of symptoms.     Baseline  Initial HEP provided at initial eval (11/04/2018); Initial HEP and progressions have been provided but not final progressions (11/28/2018); Initial HEP and progressions have been provided but not final progressions (12/09/2018; 01/06/2019)    Time  8    Period  Weeks    Status  Partially Met    Target Date  03/03/19      PT LONG TERM GOAL #2   Title  Demonstrate improved FOTO score by 10 units to demonstrate improvement in overall condition and self-reported functional ability.     Baseline  Foto = 47 (11/04/2018); too early to re-measure (11/28/2018); not measured (12/09/2018); 44 (01/06/2019);     Time  8    Period  Weeks    Status  On-going    Target Date  03/03/19      PT LONG TERM GOAL #3   Title  Complete community, work and/or recreational activities without limitation due to current condition.     Baseline   limited in daily activities and responsibilities, limited to 5# of lifting, disrupts sleep. 5# weight lift limit (11/04/2018); pt reports increased activty since starting PT, continues to have similar limitations to baseline (11/28/2018);; patient reports increased activity since strating PT, continues to have similar limitations to baseline but with slightly lower pain (12/09/2018); continues to have similar limitations but with less pain overall (01/06/2019);     Time  8    Period  Weeks    Status  On-going    Target Date  03/03/19      PT LONG TERM GOAL #4   Title  Patient will increase BLE gross strength to 4+/5 as to improve functional strength for independent gait, increased standing tolerance and increased ADL ability.    Baseline  Hip: Abduction: R = 3+/5 painful , L = 4+/5 painful!. Hip Flexion: R = 3+/5, L = 4/5. Knee Flex: R = 4/5, L = 5/5. (11/04/2018); see objective exam (01/06/2019);      Time  8    Period  Weeks    Status  Partially Met    Target Date  03/03/19      PT LONG TERM GOAL #5   Title  Be able to squat to  chair height with proper form without limitation due to current condition in order to improve ability to lift and complete transfers during usual activities.  Baseline  Difficulty with sit<>stand transfers due to pain but less than at initial eval (11/28/2018); improved sit <> stand transfers with reduced pain compared to initial eval (12/09/2018);; able to complete partial squat and improved sit to stand but continued pain (01/06/2019);     Time  8    Period  Weeks    Status  On-going    Target Date  03/03/19            Plan - 02/13/19 1156    Clinical Impression Statement  Pt tolerated treatmentwell. She has significantly less pain today compared to yesterday and was able to do exercises without lasting increase in pain. Exercises minimally advanced due to recent high level of pain. Continues to report right upper lumbar region as her most painful location in her back. Manual was limited not applied due to lack of improvement at previous sessions.Pt requiredmultimodalcuing for proper technique and to facilitate improved neuromuscular control, strength, range of motion, and functional abilitywith improved technique by end of session.She will benefit from continued physical therapy to address remaining impairments and functional deficits and work towards stated goals.    Rehab Potential  Good    Clinical Impairments Affecting Rehab Potential  (+) denies leg symptoms; (-) apparent subjective pain exaggeration, external locus of control, focus on pain.     PT Frequency  2x / week    PT Duration  6 weeks    PT Treatment/Interventions  ADLs/Self Care Home Management;Moist Heat;Cryotherapy;Functional mobility training;Stair training;Therapeutic activities;Therapeutic exercise;Balance training;Neuromuscular re-education;Patient/family education;Manual techniques;Passive range of motion;Dry needling;Spinal Manipulations;Joint Manipulations;Other (comment)    PT Next Visit Plan  progress  strengthening and stretching and specific exercise as tolerated/appropriate.     PT Home Exercise Plan  Med[employer]  Access Code: [email or web address]    [email or web address] and Agree with Plan of Care  Patient       Patient will benefit from skilled therapeutic intervention in order to improve the following deficits and impairments:  Decreased endurance, Decreased mobility, Difficulty walking, Hypomobility, Increased muscle spasms, Decreased range of motion, Impaired perceived functional ability, Impaired tone, Obesity, Decreased activity tolerance, Decreased strength, Postural dysfunction, Pain, Impaired flexibility  Visit Diagnosis: Chronic bilateral low back pain without sciatica  Difficulty in walking, not elsewhere classified     Problem List Patient Active Problem List   Diagnosis Date Noted  . Menorrhagia with regular cycle 03/11/2018  . Microcytic anemia 02/25/2018  . Iron deficiency anemia due to chronic blood loss 02/18/2018    Nancy Nordmann, PT, DPT 02/13/2019, 11:56 AM  [facility] [address], [place], [address] Phone: [phone]   Fax:  [phone]  Name: NADYNE GARIEPY MRN: [id number] Date of Birth: April 16, 1986

## 2019-02-17 ENCOUNTER — Ambulatory Visit: Payer: Medicaid Other | Attending: Nurse Practitioner | Admitting: Physical Therapy

## 2019-02-17 ENCOUNTER — Encounter: Payer: Self-pay | Admitting: Physical Therapy

## 2019-02-17 ENCOUNTER — Encounter: Payer: Medicaid Other | Admitting: Physical Therapy

## 2019-02-17 DIAGNOSIS — G8929 Other chronic pain: Secondary | ICD-10-CM | POA: Diagnosis present

## 2019-02-17 DIAGNOSIS — R262 Difficulty in walking, not elsewhere classified: Secondary | ICD-10-CM | POA: Diagnosis present

## 2019-02-17 DIAGNOSIS — M545 Low back pain: Secondary | ICD-10-CM | POA: Insufficient documentation

## 2019-02-17 NOTE — Therapy (Signed)
[facility] [address], [place], [address] Phone: [phone]   Fax:  [phone]  Physical Therapy Treatment  Patient Details  Name: Cathy Trevino MRN: [id number] Date of Birth: 07-07-1986 Referring Provider (PT): Margurite Auerbach, NP-C   Encounter Date: 02/17/2019  PT End of Session - 02/17/19 1350    Visit Number  16    Number of Visits  22    Date for PT Re-Evaluation  03/03/19    Authorization Type  Medicaid reporting period from 01/06/2019; auth through 01/19/2019    Authorization Time Period  Current Certification period: 12/07/2019 - 03/03/2019 (last PN 01/06/2019)    Authorization - Visit Number  6    Authorization - Number of Visits  12    PT Start Time  0177    PT Stop Time  1430    PT Time Calculation (min)  45 min    Activity Tolerance  Patient tolerated treatment well;Patient limited by pain;Patient limited by fatigue    Behavior During Therapy  [facility] for tasks assessed/performed       Past Medical History:  Diagnosis Date  . Anemia   . Anxiety   . Asthma   . Depression   . Menorrhagia     Past Surgical History:  Procedure Laterality Date  . CESAREAN SECTION  2012  . TUBAL LIGATION      There were no vitals filed for this visit.  Subjective Assessment - 02/17/19 1347    Subjective  Patient reports she is feeling well today with 2/10 back pain on the lower right lumbar region. She states she was sore for a short period after last treatment session but has felt pretty good this weekend.     Pertinent History  Patient is a 33 y.o. female who presents to outpatient physical therapy with a referral for chronic low back pain. This patient's chief complaints consist of pain, leading to the following functional deficits: difficulty with all activities that require movement or motion including lifting, bending, prolonged positions, sleeping. Relevant past medical history and comorbidities include  "musculoskeletal issue around heart" told to take tylenol when needed (sees cardiologist when he feels is necessary, last was July 2018), obesity, previous episodes of low back pain, history of depression and anxiety, anemia. Imaging: From MRI report dated 07/15/2018: "1. 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 2. Diffusely decreased T1 signal throughout the visualized bone marrow, nonspecific, but suspected to be related to patient's history of anemia. 3. Otherwise normal MRI of the lumbar spine."    Limitations  Sitting;Walking;Lifting;House hold activities;Standing    How long can you sit comfortably?  5-6 minutes    How long can you stand comfortably?  5-6 minutes    How long can you walk comfortably?  10 min    Diagnostic tests  MRI report dated 07/15/2018: "1. 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 2. Diffusely decreased T1 signal throughout the visualized bone marrow, nonspecific, but suspected to be related to patient's history of anemia. 3. Otherwise normal MRI of the lumbar spine."    Patient Stated Goals  Get rid of the pain so she can function better.     Currently in Pain?  Yes    Pain Score  2     Pain Location  Back  Pain Orientation  Right    Pain Descriptors / Indicators  Dull    Pain Type  Chronic pain      TREATMENT:  Denies latex allergy.  Therapeutic exercise:to centralize symptoms and improve ROM and strength required for successful completion of functional activities.  -Treadmill levelup to2.53mh, nograde.For improved lower extremity mobility, muscular endurance, and weightbearing activity tolerance; and to induce the analgesic effect of aerobic exercise, stimulate improved joint nutrition, and prepare body structures and systems for following interventions.  x5Minutes during subjective exam. Circuit 1: -Squatswithchair behind to improve confidence and reduce fear of falling backwards while working on hip hinge. Cuing for hip hinge, equal weightbearing, neutral to mild genuvarum in bilateral knees, keeping tibias perpendicular to floor to reduce forward translation of knees during movement.3x15  -Standing rows with scapular retraction for improved postural and shoulder girdle strengthening and mobility. Required instruction for technique and cuing to retract, posteriorly tilt, and depress scapulae.2x15 at25#.Cuing to prevent spinal flexion/extension each rep.  - seated lat pull with isolated scapular depression/elevation from pull2x15 at25#with cuing for proper form. -Standing pallof press (multifidus press). 2x15with singleblue therabandeach side. Cuing for trunkactivation and sequencing of exercise.  Circuit 2: -Side stepping withbluetheraband wrapped around legs and held at umbilicus to activate core and hip stabilizers and abductors in functional weightbearing position. Cuing to activate abdominals. Stepping a few steps out and in as allowed by band 2x2 min plus time for instruction. -Quadruped bird dogwith hip extensioncuing for abdominal brace.2x10each side,plus time for instruction, rest, and transition.  -supinedead bug (hip,kneesflexed to90 starting position then alternating hip/knee extension and contralateral shoulder flexion with abdominal activation) with ball held between knees and hands,legs to 45-50 degrees.2X10each sideplus time for instruction, rest, and transition. cuing for care activation and to keep shins parallel to ground.  HOME EXERCISE PROGRAM Access [facility]: [facility] URL: [email or web address]  Date: 02/05/2019  Prepared by: SRosita Kea  Exercises   Squat with Chair Touch - 10-15 reps - 1 second hold - 3 Sets - 1x daily - 7x weekly   Bird Dog - 10-15 reps - 1 second  hold - 3 Sets - 1x daily - 7x weekly   Dead Bug with Swiss Ball - 10-15 reps - 1 second hold - 3 Sets - 1x daily - 7x weekly   Side Plank on Knees - 10-15 reps - 1 second hold - 3 Sets - 1x daily - 3x weekly   Staggered Stance Single Arm Row with Anchored Resistance - 10-15 reps - 1 second hold - 3 Sets - 1x daily - 3x weekly   Seated Correct Posture   Walking - 30 min hold - 4x weekly     PT Education - 02/17/19 1349    Education Details  Exercise purpose/form. Self management techniques. Education on diagnosis, prognosis, POC, anatomy and physiology of current condition    Person(s) Educated  Patient    Methods  Explanation;Demonstration;Tactile cues    Comprehension  Verbalized understanding;Returned demonstration;Verbal cues required       PT Short Term Goals - 12/09/18 1408      PT SHORT TERM GOAL #1   Title  Be independent with home exercise program completed at least 3 times per week for self-management of symptoms.    Baseline  Initial HEP provided at initial eval (11/04/2018);    Time  2    Period  Weeks    Status  Achieved    Target Date  11/19/18      PT  SHORT TERM GOAL #2   Title  Reduce pain to 8/10 with functional activities to allow patient to complete valued functional tasks such as sit to stand with less difficulty.    Baseline  reports 10/10 at initial evaluation (11/04/2018); 9.5/10 (11/28/2018); 8/10 at night while trying to sleep (12/09/2018)    Time  2    Period  Weeks    Status  Achieved    Target Date  12/12/18      PT SHORT TERM GOAL #3   Title  Improve lumbar AROM to 75% to allow patient to complete valued activities with less difficulty.     Baseline  50% with end range pain at initial evaluaiton (11/04/2018);     Time  2    Period  Weeks    Status  Achieved    Target Date  11/19/18        PT Long Term Goals - 01/06/19 1940      PT LONG TERM GOAL #1   Title  Be independent with a long-term home exercise program for self-management of  symptoms.     Baseline  Initial HEP provided at initial eval (11/04/2018); Initial HEP and progressions have been provided but not final progressions (11/28/2018); Initial HEP and progressions have been provided but not final progressions (12/09/2018; 01/06/2019)    Time  8    Period  Weeks    Status  Partially Met    Target Date  03/03/19      PT LONG TERM GOAL #2   Title  Demonstrate improved FOTO score by 10 units to demonstrate improvement in overall condition and self-reported functional ability.     Baseline  Foto = 47 (11/04/2018); too early to re-measure (11/28/2018); not measured (12/09/2018); 44 (01/06/2019);     Time  8    Period  Weeks    Status  On-going    Target Date  03/03/19      PT LONG TERM GOAL #3   Title  Complete community, work and/or recreational activities without limitation due to current condition.     Baseline   limited in daily activities and responsibilities, limited to 5# of lifting, disrupts sleep. 5# weight lift limit (11/04/2018); pt reports increased activty since starting PT, continues to have similar limitations to baseline (11/28/2018);; patient reports increased activity since strating PT, continues to have similar limitations to baseline but with slightly lower pain (12/09/2018); continues to have similar limitations but with less pain overall (01/06/2019);     Time  8    Period  Weeks    Status  On-going    Target Date  03/03/19      PT LONG TERM GOAL #4   Title  Patient will increase BLE gross strength to 4+/5 as to improve functional strength for independent gait, increased standing tolerance and increased ADL ability.    Baseline  Hip: Abduction: R = 3+/5 painful , L = 4+/5 painful!. Hip Flexion: R = 3+/5, L = 4/5. Knee Flex: R = 4/5, L = 5/5. (11/04/2018); see objective exam (01/06/2019);      Time  8    Period  Weeks    Status  Partially Met    Target Date  03/03/19      PT LONG TERM GOAL #5   Title  Be able to squat to chair height with  proper form without limitation due to current condition in order to improve ability to lift and complete transfers during usual activities.  Baseline  Difficulty with sit<>stand transfers due to pain but less than at initial eval (11/28/2018); improved sit <> stand transfers with reduced pain compared to initial eval (12/09/2018);; able to complete partial squat and improved sit to stand but continued pain (01/06/2019);     Time  8    Period  Weeks    Status  On-going    Target Date  03/03/19            Plan - 02/17/19 1445    Clinical Impression Statement  Pt tolerated treatmentwell. She continues to make progress towards stated goals. She was able to complete all exercises with slightly progressed reps, sets, or difficulty for most exercises with 2 point increase in pain on the NPRS. She is unsure when her pain went up but reported it during the first round of the second circuit.Continues to report right upper lumbar region as her most painful location in her back. Manual was not applied due to lack of improvement at previous sessions.Pt requiredmultimodalcuing for proper technique and to facilitate improved neuromuscular control, strength, range of motion, and functional abilitywith improved technique by end of session.She will benefit from continued physical therapy to address remaining impairments and functional deficits and work towards stated goals.    Rehab Potential  Good    Clinical Impairments Affecting Rehab Potential  (+) denies leg symptoms; (-) apparent subjective pain exaggeration, external locus of control, focus on pain.     PT Frequency  2x / week    PT Duration  6 weeks    PT Treatment/Interventions  ADLs/Self Care Home Management;Moist Heat;Cryotherapy;Functional mobility training;Stair training;Therapeutic activities;Therapeutic exercise;Balance training;Neuromuscular re-education;Patient/family education;Manual techniques;Passive range of motion;Dry  needling;Spinal Manipulations;Joint Manipulations;Other (comment)    PT Next Visit Plan  progress strengthening and stretching and specific exercise as tolerated/appropriate.     PT Home Exercise Plan  Medbridge  Access Code: [email or web address]    EPPIRJJOA and Agree with Plan of Care  Patient       Patient will benefit from skilled therapeutic intervention in order to improve the following deficits and impairments:  Decreased endurance, Decreased mobility, Difficulty walking, Hypomobility, Increased muscle spasms, Decreased range of motion, Impaired perceived functional ability, Impaired tone, Obesity, Decreased activity tolerance, Decreased strength, Postural dysfunction, Pain, Impaired flexibility  Visit Diagnosis: Chronic bilateral low back pain without sciatica  Difficulty in walking, not elsewhere classified     Problem List Patient Active Problem List   Diagnosis Date Noted  . Menorrhagia with regular cycle 03/11/2018  . Microcytic anemia 02/25/2018  . Iron deficiency anemia due to chronic blood loss 02/18/2018    Nancy Nordmann, PT, DPT 02/17/2019, 2:51 PM  [facility] [address] [place], [address] Phone: [phone]   Fax:  [phone]  Name: CAILEY TRIGUEROS MRN: [id number] Date of Birth: 03/15/1986

## 2019-02-19 ENCOUNTER — Encounter: Payer: Self-pay | Admitting: Physical Therapy

## 2019-02-19 ENCOUNTER — Ambulatory Visit: Payer: Medicaid Other | Admitting: Physical Therapy

## 2019-02-19 DIAGNOSIS — G8929 Other chronic pain: Secondary | ICD-10-CM

## 2019-02-19 DIAGNOSIS — M545 Low back pain: Secondary | ICD-10-CM | POA: Diagnosis not present

## 2019-02-19 DIAGNOSIS — R262 Difficulty in walking, not elsewhere classified: Secondary | ICD-10-CM

## 2019-02-19 NOTE — Therapy (Signed)
[facility] [address], [place], [address] Phone: [phone]   Fax:  [phone]  Physical Therapy Treatment  Patient Details  Name: Cathy Trevino MRN: [id number] Date of Birth: 08-May-1986 Referring Provider (PT): Margurite Auerbach, NP-C   Encounter Date: 02/19/2019  PT End of Session - 02/19/19 1354    Visit Number  17    Number of Visits  22    Date for PT Re-Evaluation  03/03/19    Authorization Type  Medicaid reporting period from 01/06/2019; auth through 01/19/2019    Authorization Time Period  Current Certification period: 12/07/2019 - 03/03/2019 (last PN 01/06/2019)    Authorization - Visit Number  7    Authorization - Number of Visits  12    PT Start Time  0962    PT Stop Time  1430    PT Time Calculation (min)  45 min    Activity Tolerance  Patient tolerated treatment well;Patient limited by pain;Patient limited by fatigue    Behavior During Therapy  [facility] for tasks assessed/performed       Past Medical History:  Diagnosis Date  . Anemia   . Anxiety   . Asthma   . Depression   . Menorrhagia     Past Surgical History:  Procedure Laterality Date  . CESAREAN SECTION  2012  . TUBAL LIGATION      There were no vitals filed for this visit.  Subjective Assessment - 02/19/19 1349    Subjective  Patient reports 3/10 pain in the right lower back. She reports her pain remained 4/10 for the rest of the day following last treatment session.  Her pain has ranged between 2-3/10 since then.     Pertinent History  Patient is a 33 y.o. female who presents to outpatient physical therapy with a referral for chronic low back pain. This patient's chief complaints consist of pain, leading to the following functional deficits: difficulty with all activities that require movement or motion including lifting, bending, prolonged positions, sleeping. Relevant past medical history and comorbidities include  "musculoskeletal issue around heart" told to take tylenol when needed (sees cardiologist when he feels is necessary, last was July 2018), obesity, previous episodes of low back pain, history of depression and anxiety, anemia. Imaging: From MRI report dated 07/15/2018: "1. 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 2. Diffusely decreased T1 signal throughout the visualized bone marrow, nonspecific, but suspected to be related to patient's history of anemia. 3. Otherwise normal MRI of the lumbar spine."    Limitations  Sitting;Walking;Lifting;House hold activities;Standing    How long can you sit comfortably?  5-6 minutes    How long can you stand comfortably?  5-6 minutes    How long can you walk comfortably?  10 min    Diagnostic tests  MRI report dated 07/15/2018: "1. 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 2. Diffusely decreased T1 signal throughout the visualized bone marrow, nonspecific, but suspected to be related to patient's history of anemia. 3. Otherwise normal MRI of the lumbar spine."    Patient Stated Goals  Get rid of the pain so she can function better.     Currently in Pain?  Yes    Pain Score  3     Pain Location  Back  Pain Orientation  Right    Pain Descriptors / Indicators  Dull    Pain Type  Chronic pain       TREATMENT:  Denies latex allergy.  Therapeutic exercise:to centralize symptoms and improve ROM and strength required for successful completion of functional activities.  Treadmill levelup to2.7mh, 3%grade.For improved lower extremity mobility, muscular endurance, and weightbearing activity tolerance; and to induce the analgesic effect of aerobic exercise, stimulate improved joint nutrition, and prepare body structures and systems for following interventions.  x5Minutes during subjective exam. Circuit 1: -Squatswithchair behind to improve confidence and reduce fear of falling backwards while working on hip hinge. Cuing for hip hinge, equal weightbearing, neutral to mild genuvarum in bilateral knees, keeping tibias perpendicular to floor to reduce forward translation of knees during movement. 3x15  -Standing single arm rows with scapular retraction for improved postural and shoulder girdle strengthening and mobility. Required instruction for technique and cuing to retract, posteriorly tilt, and depress scapulae.2x15 at15#.Cuing to prevent spinal flexion/extension each rep.  - seated lat pull with isolated scapular depression/elevation from pull2x15 at35#with cuing for proper form.  - Chops (x15, x10) and scoops (2x10) with green theraband, each direction. To improve trunk strength for rotational activities. Cuing for form and technique.    Non-Circuit exercises:  - hooklying bridges with 10# over each ASIS to improve lumbar and hip extension strength. Attempted single leg with no weight but too difficult to complete with good form. X14. Cuing for full hip extension.  -Side stepping withblacktheraband wrapped around legs and held at umbilicus to activate core and hip stabilizers and abductors in functional weightbearing position. Cuing to activate abdominals. Stepping a few steps out and in as allowed by band x2 min plus time for instruction. - single leg sit <> stand from chair with airex pad on top of seat and PVC pole pushing into ground using contralateral single UE (to provide appropriate assistance to get up without using 2nd foot). X 5 each side after several trials of various chair heights and levels of support. To improve leg strength and asymetrical loading activity tolerance to improve pain.  - step ups with contralateral high march knee to band stretched in front of pt as a target. 7# dumbbell held in UE contralateral to stepping  LE to increase load on glute med. To improve single leg and trunk strength with asymmetrical functional loading. Cuing for technique. X 10 each side. 8" step.  HOME EXERCISE PROGRAM [email or web address] URL: [email or web address]  Date: 02/05/2019  Prepared by: SRosita Kea  Exercises   Squat with Chair Touch - 10-15 reps - 1 second hold - 3 Sets - 1x daily - 7x weekly   Bird Dog - 10-15 reps - 1 second hold - 3 Sets - 1x daily - 7x weekly   Dead Bug with Swiss Ball - 10-15 reps - 1 second hold - 3 Sets - 1x daily - 7x weekly   Side Plank on Knees - 10-15 reps - 1 second hold - 3 Sets - 1x daily - 3x weekly   Staggered Stance Single Arm Row with Anchored Resistance - 10-15 reps - 1 second hold - 3 Sets - 1x daily - 3x weekly   Seated Correct Posture   Walking - 30 min hold - 4x weekly    PT Education - 02/19/19 1354    Education Details  Exercise purpose/form. Self management techniques. Education on diagnosis, prognosis, POC, anatomy and physiology of current condition    Person(s)  Educated  Patient    Methods  Explanation;Demonstration;Tactile cues;Verbal cues    Comprehension  Verbalized understanding;Returned demonstration       PT Short Term Goals - 12/09/18 1408      PT SHORT TERM GOAL #1   Title  Be independent with home exercise program completed at least 3 times per week for self-management of symptoms.    Baseline  Initial HEP provided at initial eval (11/04/2018);    Time  2    Period  Weeks    Status  Achieved    Target Date  11/19/18      PT SHORT TERM GOAL #2   Title  Reduce pain to 8/10 with functional activities to allow patient to complete valued functional tasks such as sit to stand with less difficulty.    Baseline  reports 10/10 at initial evaluation (11/04/2018); 9.5/10 (11/28/2018); 8/10 at night while trying to sleep (12/09/2018)    Time  2    Period  Weeks    Status  Achieved    Target Date  12/12/18      PT SHORT TERM GOAL  #3   Title  Improve lumbar AROM to 75% to allow patient to complete valued activities with less difficulty.     Baseline  50% with end range pain at initial evaluaiton (11/04/2018);     Time  2    Period  Weeks    Status  Achieved    Target Date  11/19/18        PT Long Term Goals - 01/06/19 1940      PT LONG TERM GOAL #1   Title  Be independent with a long-term home exercise program for self-management of symptoms.     Baseline  Initial HEP provided at initial eval (11/04/2018); Initial HEP and progressions have been provided but not final progressions (11/28/2018); Initial HEP and progressions have been provided but not final progressions (12/09/2018; 01/06/2019)    Time  8    Period  Weeks    Status  Partially Met    Target Date  03/03/19      PT LONG TERM GOAL #2   Title  Demonstrate improved FOTO score by 10 units to demonstrate improvement in overall condition and self-reported functional ability.     Baseline  Foto = 47 (11/04/2018); too early to re-measure (11/28/2018); not measured (12/09/2018); 44 (01/06/2019);     Time  8    Period  Weeks    Status  On-going    Target Date  03/03/19      PT LONG TERM GOAL #3   Title  Complete community, work and/or recreational activities without limitation due to current condition.     Baseline   limited in daily activities and responsibilities, limited to 5# of lifting, disrupts sleep. 5# weight lift limit (11/04/2018); pt reports increased activty since starting PT, continues to have similar limitations to baseline (11/28/2018);; patient reports increased activity since strating PT, continues to have similar limitations to baseline but with slightly lower pain (12/09/2018); continues to have similar limitations but with less pain overall (01/06/2019);     Time  8    Period  Weeks    Status  On-going    Target Date  03/03/19      PT LONG TERM GOAL #4   Title  Patient will increase BLE gross strength to 4+/5 as to improve functional  strength for independent gait, increased standing tolerance and increased ADL ability.    Baseline  Hip: Abduction: R = 3+/5 painful , L = 4+/5 painful!. Hip Flexion: R = 3+/5, L = 4/5. Knee Flex: R = 4/5, L = 5/5. (11/04/2018); see objective exam (01/06/2019);      Time  8    Period  Weeks    Status  Partially Met    Target Date  03/03/19      PT LONG TERM GOAL #5   Title  Be able to squat to chair height with proper form without limitation due to current condition in order to improve ability to lift and complete transfers during usual activities.     Baseline  Difficulty with sit<>stand transfers due to pain but less than at initial eval (11/28/2018); improved sit <> stand transfers with reduced pain compared to initial eval (12/09/2018);; able to complete partial squat and improved sit to stand but continued pain (01/06/2019);     Time  8    Period  Weeks    Status  On-going    Target Date  03/03/19            Plan - 02/19/19 1659    Clinical Impression Statement   Pt tolerated treatmentwell. She continues to make progress towards stated goals. She was able to complete all exercises with no increase in pain. She was able to progress resistance, exercise complexity, and resistance in several exercises. Continues to report right upper lumbar region as her most painful location in her back. Manual was not applied due to lack of improvement at previous sessions.Pt requiredmultimodalcuing for proper technique and to facilitate improved neuromuscular control, strength, range of motion, and functional abilitywith improved technique by end of session.She will benefit from continued physical therapy to address remaining impairments and functional deficits and work towards stated goals.    Rehab Potential  Good    Clinical Impairments Affecting Rehab Potential  (+) denies leg symptoms; (-) apparent subjective pain exaggeration, external locus of control, focus on pain.     PT Frequency  2x /  week    PT Duration  6 weeks    PT Treatment/Interventions  ADLs/Self Care Home Management;Moist Heat;Cryotherapy;Functional mobility training;Stair training;Therapeutic activities;Therapeutic exercise;Balance training;Neuromuscular re-education;Patient/family education;Manual techniques;Passive range of motion;Dry needling;Spinal Manipulations;Joint Manipulations;Other (comment)    PT Next Visit Plan  progress strengthening and stretching and specific exercise as tolerated/appropriate.     PT Home Exercise Plan  Medbridge  Access Code: [email or web address]    [email or web address] and Agree with Plan of Care  Patient       Patient will benefit from skilled therapeutic intervention in order to improve the following deficits and impairments:  Decreased endurance, Decreased mobility, Difficulty walking, Hypomobility, Increased muscle spasms, Decreased range of motion, Impaired perceived functional ability, Impaired tone, Obesity, Decreased activity tolerance, Decreased strength, Postural dysfunction, Pain, Impaired flexibility  Visit Diagnosis: Chronic bilateral low back pain without sciatica  Difficulty in walking, not elsewhere classified     Problem List Patient Active Problem List   Diagnosis Date Noted  . Menorrhagia with regular cycle 03/11/2018  . Microcytic anemia 02/25/2018  . Iron deficiency anemia due to chronic blood loss 02/18/2018    Nancy Nordmann, PT, DPT 02/19/2019, 5:00 PM  [facility] [address], [place], [address] Phone: [phone]   Fax:  [phone]  Name: TORRIN FREIN MRN: [id number] Date of Birth: Feb 12, 1986

## 2019-02-24 ENCOUNTER — Ambulatory Visit: Payer: Medicaid Other | Admitting: Physical Therapy

## 2019-02-24 ENCOUNTER — Encounter: Payer: Self-pay | Admitting: Physical Therapy

## 2019-02-24 DIAGNOSIS — R262 Difficulty in walking, not elsewhere classified: Secondary | ICD-10-CM

## 2019-02-24 DIAGNOSIS — G8929 Other chronic pain: Secondary | ICD-10-CM

## 2019-02-24 DIAGNOSIS — M545 Low back pain: Principal | ICD-10-CM

## 2019-02-24 NOTE — Therapy (Signed)
[facility] [address] [place], [address] Phone: [phone]   Fax:  [phone]  Physical Therapy Treatment  Patient Details  Name: KATHARINA JEHLE MRN: [id number] Date of Birth: 01-10-1986 Referring Provider (PT): Margurite Auerbach, NP-C   Encounter Date: 02/24/2019  PT End of Session - 02/24/19 1509    Visit Number  18    Number of Visits  22    Date for PT Re-Evaluation  03/03/19    Authorization Type  Medicaid reporting period from 01/06/2019; auth through 01/19/2019    Authorization Time Period  Current Certification period: 12/07/2019 - 03/03/2019 (last PN 01/06/2019)    Authorization - Visit Number  8    Authorization - Number of Visits  12    PT Start Time  1510    PT Stop Time  1550    PT Time Calculation (min)  40 min    Activity Tolerance  Patient limited by pain;Patient limited by fatigue    Behavior During Therapy  [facility] for tasks assessed/performed       Past Medical History:  Diagnosis Date  . Anemia   . Anxiety   . Asthma   . Depression   . Menorrhagia     Past Surgical History:  Procedure Laterality Date  . CESAREAN SECTION  2012  . TUBAL LIGATION      There were no vitals filed for this visit.  Subjective Assessment - 02/24/19 1517    Subjective  Patient reports she has 4/10 pain upon arrival. She has been picking up her youngest son from the floor all day because he has been mis-behaving and that has aggrevated her pain. She reports her pain went up slightly following last treatment session but only for a short period and it came back down to a 2/10 for most of the weekend.     Pertinent History  Patient is a 33 y.o. female who presents to outpatient physical therapy with a referral for chronic low back pain. This patient's chief complaints consist of pain, leading to the following functional deficits: difficulty with all activities that require movement or motion including lifting,  bending, prolonged positions, sleeping. Relevant past medical history and comorbidities include "musculoskeletal issue around heart" told to take tylenol when needed (sees cardiologist when he feels is necessary, last was July 2018), obesity, previous episodes of low back pain, history of depression and anxiety, anemia. Imaging: From MRI report dated 07/15/2018: "1. 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 2. Diffusely decreased T1 signal throughout the visualized bone marrow, nonspecific, but suspected to be related to patient's history of anemia. 3. Otherwise normal MRI of the lumbar spine."    Limitations  Sitting;Walking;Lifting;House hold activities;Standing    How long can you sit comfortably?  5-6 minutes    How long can you stand comfortably?  5-6 minutes    How long can you walk comfortably?  10 min    Diagnostic tests  MRI report dated 07/15/2018: "1. 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 2. Diffusely decreased T1 signal throughout the visualized bone marrow, nonspecific, but suspected to be related to patient's history of anemia. 3. Otherwise normal MRI of the lumbar spine."    Patient Stated Goals  Get rid of the pain so she can function better.  Currently in Pain?  Yes    Pain Score  4     Pain Location  Back    Pain Orientation  Right    Pain Descriptors / Indicators  Dull    Pain Type  Chronic pain       TREATMENT:  Denies latex allergy.  Therapeutic exercise:to centralize symptoms and improve ROM and strength required for successful completion of functional activities.  Treadmill levelup to2.75mh, 5%grade.For improved lower extremity mobility, muscular endurance, and weightbearing activity tolerance; and to induce the analgesic effect of aerobic exercise, stimulate  improved joint nutrition, and prepare body structures and systems for following interventions. x5Minutes during subjective exam. -Side stepping withblacktheraband wrapped around legs and held at umbilicus to activate core and hip stabilizers and abductors in functional weightbearing position. Cuing to activate abdominals. Stepping a few steps out and in as allowed by band x2 min plus time for instruction. -Squatswithchair behind to improve confidence and reduce fear of falling backwards while working on hip hinge. Cuing for hip hinge, equal weightbearing, neutral to mild genuvarum in bilateral knees, keeping tibias perpendicular to floor to reduce forward translation of knees during movement. x15 - Chops (x10)  with green theraband, each direction. To improve trunk strength for rotational activities. Cuing for form and technique. -Standing single arm rows with scapular retraction for improved postural and shoulder girdle strengthening and mobility. Required instruction for technique and cuing to retract, posteriorly tilt, and depress scapulae.x15 R and x10 Lat20#.Cuing to prevent spinal flexion/extension each rep.  - seated lat pull with isolated scapular depression/elevation from pullx17 at35#with cuing for proper form. - attempted step ups with contralateral high march knee to band stretched in front of pt as a target. 7# dumbbell held in UE contralateral to stepping LE to increase load on glute med. To improve single leg and trunk strength with asymmetrical functional loading. Cuing for technique. X 5 each side. 12" step. - single leg sit <> stand from 24 inch surface. PVC pole pushing into ground using contralateral single UE (to provide appropriate assistance to get up without using 2nd foot). X 5 each side. To improve leg strength and asymetrical loading activity tolerance to improve pain.  - hooklying bridges with 10# over each ASIS to improve lumbar and hip extension strength.  Attempted single leg with no weight but too difficult to complete with good form. X15. Cuing for full hip extension.  -supinedead bug (hip,kneesflexed to90 starting position then alternating hip/knee extension and contralateral shoulder flexion with abdominal activation) with ball held between knees and hands,legs to 45-50 degrees.x13each sideplus time for instruction, rest, and transition. cuing for care activation and to keep shins parallel to ground. -Quadruped bird dogwith hip extensioncuing for abdominal brace.x10each side,plus time for instruction, rest, and transition.     HOME EXERCISE PROGRAM [facility] URL: [email or web address]  Date: 02/05/2019  Prepared by: SRosita Kea  Exercises   Squat with Chair Touch - 10-15 reps - 1 second hold - 3 Sets - 1x daily - 7x weekly   Bird Dog - 10-15 reps - 1 second hold - 3 Sets - 1x daily - 7x weekly   Dead Bug with Swiss Ball - 10-15 reps - 1 second hold - 3 Sets - 1x daily - 7x weekly   Side Plank on Knees - 10-15 reps - 1 second hold - 3 Sets - 1x daily - 3x weekly   Staggered Stance Single Arm Row with Anchored Resistance - 10-15 reps - 1  second hold - 3 Sets - 1x daily - 3x weekly   Seated Correct Posture   Walking - 30 min hold - 4x weekly    PT Education - 02/24/19 1520    Education Details  Exercise purpose/form. Self management techniques. Education on diagnosis, prognosis, POC, anatomy and physiology of current condition    Person(s) Educated  Patient    Methods  Explanation;Demonstration;Tactile cues;Verbal cues    Comprehension  Verbalized understanding;Returned demonstration       PT Short Term Goals - 12/09/18 1408      PT SHORT TERM GOAL #1   Title  Be independent with home exercise program completed at least 3 times per week for self-management of symptoms.    Baseline  Initial HEP provided at initial eval (11/04/2018);    Time  2    Period  Weeks    Status   Achieved    Target Date  11/19/18      PT SHORT TERM GOAL #2   Title  Reduce pain to 8/10 with functional activities to allow patient to complete valued functional tasks such as sit to stand with less difficulty.    Baseline  reports 10/10 at initial evaluation (11/04/2018); 9.5/10 (11/28/2018); 8/10 at night while trying to sleep (12/09/2018)    Time  2    Period  Weeks    Status  Achieved    Target Date  12/12/18      PT SHORT TERM GOAL #3   Title  Improve lumbar AROM to 75% to allow patient to complete valued activities with less difficulty.     Baseline  50% with end range pain at initial evaluaiton (11/04/2018);     Time  2    Period  Weeks    Status  Achieved    Target Date  11/19/18        PT Long Term Goals - 01/06/19 1940      PT LONG TERM GOAL #1   Title  Be independent with a long-term home exercise program for self-management of symptoms.     Baseline  Initial HEP provided at initial eval (11/04/2018); Initial HEP and progressions have been provided but not final progressions (11/28/2018); Initial HEP and progressions have been provided but not final progressions (12/09/2018; 01/06/2019)    Time  8    Period  Weeks    Status  Partially Met    Target Date  03/03/19      PT LONG TERM GOAL #2   Title  Demonstrate improved FOTO score by 10 units to demonstrate improvement in overall condition and self-reported functional ability.     Baseline  Foto = 47 (11/04/2018); too early to re-measure (11/28/2018); not measured (12/09/2018); 44 (01/06/2019);     Time  8    Period  Weeks    Status  On-going    Target Date  03/03/19      PT LONG TERM GOAL #3   Title  Complete community, work and/or recreational activities without limitation due to current condition.     Baseline   limited in daily activities and responsibilities, limited to 5# of lifting, disrupts sleep. 5# weight lift limit (11/04/2018); pt reports increased activty since starting PT, continues to have similar  limitations to baseline (11/28/2018);; patient reports increased activity since strating PT, continues to have similar limitations to baseline but with slightly lower pain (12/09/2018); continues to have similar limitations but with less pain overall (01/06/2019);     Time  8  Period  Weeks    Status  On-going    Target Date  03/03/19      PT LONG TERM GOAL #4   Title  Patient will increase BLE gross strength to 4+/5 as to improve functional strength for independent gait, increased standing tolerance and increased ADL ability.    Baseline  Hip: Abduction: R = 3+/5 painful , L = 4+/5 painful!. Hip Flexion: R = 3+/5, L = 4/5. Knee Flex: R = 4/5, L = 5/5. (11/04/2018); see objective exam (01/06/2019);      Time  8    Period  Weeks    Status  Partially Met    Target Date  03/03/19      PT LONG TERM GOAL #5   Title  Be able to squat to chair height with proper form without limitation due to current condition in order to improve ability to lift and complete transfers during usual activities.     Baseline  Difficulty with sit<>stand transfers due to pain but less than at initial eval (11/28/2018); improved sit <> stand transfers with reduced pain compared to initial eval (12/09/2018);; able to complete partial squat and improved sit to stand but continued pain (01/06/2019);     Time  8    Period  Weeks    Status  On-going    Target Date  03/03/19            Plan - 02/24/19 1609    Clinical Impression Statement  Pt tolerated treatmentfair. Her pain was elevated today upon arrival after lifting her son several times today. She was limited due to pain and was unable to tolerate attempted progressions today. Her pain increased one point on the NPRS and she had visible pain behavior throughout tretament but gave good effort. Modifications were made to accommodate pain. Continues to report right upper lumbar region as her most painful location in her back. Manual was not applied due to lack of  improvement at previous sessions.Pt requiredmultimodalcuing for proper technique and to facilitate improved neuromuscular control, strength, range of motion, and functional abilitywith improved technique by end of session.She will benefit from continued physical therapy to address remaining impairments and functional deficits and work towards stated goals.    Rehab Potential  Good    Clinical Impairments Affecting Rehab Potential  (+) denies leg symptoms; (-) apparent subjective pain exaggeration, external locus of control, focus on pain.     PT Frequency  2x / week    PT Duration  6 weeks    PT Treatment/Interventions  ADLs/Self Care Home Management;Moist Heat;Cryotherapy;Functional mobility training;Stair training;Therapeutic activities;Therapeutic exercise;Balance training;Neuromuscular re-education;Patient/family education;Manual techniques;Passive range of motion;Dry needling;Spinal Manipulations;Joint Manipulations;Other (comment)    PT Next Visit Plan  progress strengthening and stretching and specific exercise as tolerated/appropriate.     PT Home Exercise Plan  Medbridge  Access Code: [email or web address]    [email or web address] and Agree with Plan of Care  Patient       Patient will benefit from skilled therapeutic intervention in order to improve the following deficits and impairments:  Decreased endurance, Decreased mobility, Difficulty walking, Hypomobility, Increased muscle spasms, Decreased range of motion, Impaired perceived functional ability, Impaired tone, Obesity, Decreased activity tolerance, Decreased strength, Postural dysfunction, Pain, Impaired flexibility  Visit Diagnosis: Chronic bilateral low back pain without sciatica  Difficulty in walking, not elsewhere classified     Problem List Patient Active Problem List   Diagnosis Date Noted  . Menorrhagia with regular cycle 03/11/2018  . Microcytic anemia 02/25/2018  .  Iron deficiency anemia due to chronic blood loss 02/18/2018     Nancy Nordmann, PT, DPT 02/24/2019, 4:10 PM  [facility] [address] [place], [address] Phone: [phone]   Fax:  [phone]  Name: ERRICKA FALKNER MRN: [id number] Date of Birth: 1986/06/13

## 2019-02-26 ENCOUNTER — Encounter: Payer: Self-pay | Admitting: Physical Therapy

## 2019-02-26 ENCOUNTER — Ambulatory Visit: Payer: Medicaid Other | Admitting: Physical Therapy

## 2019-02-26 ENCOUNTER — Other Ambulatory Visit: Payer: Self-pay

## 2019-02-26 DIAGNOSIS — M545 Low back pain: Secondary | ICD-10-CM | POA: Diagnosis not present

## 2019-02-26 DIAGNOSIS — R262 Difficulty in walking, not elsewhere classified: Secondary | ICD-10-CM

## 2019-02-26 DIAGNOSIS — G8929 Other chronic pain: Secondary | ICD-10-CM

## 2019-02-26 NOTE — Therapy (Signed)
[facility] [address] [place], [address] Phone: [phone]   Fax:  [phone]  Physical Therapy Treatment / Re-Certification / Progress Note Reporting period: 01/06/2019 - 02/26/2019  Patient Details  Name: Cathy Trevino MRN: [id number] Date of Birth: [birth year] Referring Provider (PT): Margurite Auerbach, NP-C   Encounter Date: 02/26/2019  PT End of Session - 02/26/19 1612    Visit Number  19    Number of Visits  22    Date for PT Re-Evaluation  03/03/19    Authorization Type  Medicaid reporting period from 01/06/2019; auth through 03/05/2019    Authorization Time Period  Current Certification period: 12/07/2019 - 9/67/5916 (last PN/Re-Cert 3/84/6659)    Authorization - Visit Number  9    Authorization - Number of Visits  12    PT Start Time  1500    PT Stop Time  1545    PT Time Calculation (min)  45 min    Activity Tolerance  Patient limited by pain;Patient limited by fatigue    Behavior During Therapy  [facility] for tasks assessed/performed       Past Medical History:  Diagnosis Date  . Anemia   . Anxiety   . Asthma   . Depression   . Menorrhagia     Past Surgical History:  Procedure Laterality Date  . CESAREAN SECTION  2012  . TUBAL LIGATION      There were no vitals filed for this visit.  Subjective Assessment - 02/26/19 1605    Subjective  Patient reports she is doing well today. 3/10 pain upon arrival. She feels that overall she is having more good days than bad days but her pain does continue to fluxuate some. She reports her biggest trigger right now is when she has to pick up her son repeatedly when he is acting up.     Pertinent History  Patient is a 33 y.o. female who presents to outpatient physical therapy with a referral for chronic low back pain. This patient's chief complaints consist of pain, leading to the following functional deficits: difficulty with all activities that require  movement or motion including lifting, bending, prolonged positions, sleeping. Relevant past medical history and comorbidities include "musculoskeletal issue around heart" told to take tylenol when needed (sees cardiologist when he feels is necessary, last was July 2018), obesity, previous episodes of low back pain, history of depression and anxiety, anemia. Imaging: From MRI report dated 07/15/2018: "1. 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 2. Diffusely decreased T1 signal throughout the visualized bone marrow, nonspecific, but suspected to be related to patient's history of anemia. 3. Otherwise normal MRI of the lumbar spine."    Limitations  Sitting;Walking;Lifting;House hold activities;Standing    How long can you sit comfortably?  < 10 minutes on bad day. 10 min on a good day.     How long can you stand comfortably?  "I hate that" "I just have to keep moving" "not sure if it is the pain or everything just keeps moving"     How long can you walk comfortably?  20-25 min.     Diagnostic tests  MRI report dated 07/15/2018: "1. 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 2. Diffusely decreased T1 signal throughout the visualized bone marrow, nonspecific, but suspected to  be related to patient's history of anemia. 3. Otherwise normal MRI of the lumbar spine."    Patient Stated Goals  Get rid of the pain so she can function better.     Currently in Pain?  Yes    Pain Score  3     Pain Location  Back    Pain Orientation  Right    Pain Descriptors / Indicators  Dull   occasionally sharp (when she gets up in the morning)   Pain Type  Chronic pain    Pain Onset  More than a month ago    Pain Frequency  Intermittent    Aggravating Factors   any position too long, getting up in the morning, repeatedly lifting  son, bending, sitting is worse than standing    Pain Relieving Factors  heating pad, 3-sided pillow when having to sit a long time.     Effect of Pain on Daily Activities  continues to be limited with more difficulty than prior to pain onset. Interferes with ADLs, IADLs, caring for familiy, participating in usual activities. feels she has gained confidence and motivation to do more in the face of pain since starting PT>          [facility] PT Assessment - 02/26/19 0001      Assessment   Medical Diagnosis  Chronic low back pai    Referring Provider (PT)  Margurite Auerbach, NP-C    Onset Date/Surgical Date  07/05/18    Next MD Visit  01/16/2018    Prior Therapy  no      Precautions   Precautions  Other (comment)   no lifting over 5#     Restrictions   Weight Bearing Restrictions  No    Other Position/Activity Restrictions  no lifting over 5#      Prior Function   Level of Independence  Independent    Vocation  Works at home    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     Focus on Therapeutic Outcomes (FOTO)   41       FOTO = 44 (no longer seems significant as now based on one question about stairs), mODI = 50% (first time measured, will be new functional outcome measure)  OBJECTIVE: OBSERVATION/INSPECTION: Patient presents withobesity, slight left shift, flattening of thoracic spine  SPINE MOTION Lumbar AROM:  *Indicates pain  Flexion: = ankles, (painful return).  Extension: = 50% ERP.  Rotation: R = 100% ERP, L = 100% ERP  Side Flexion: R = just distal to patella (ERP on R side), L = to patella  PERIPHERAL JOINT MOTION (AROM/PROM in degrees):  *Indicates pain BLE grossly WFL.  STRENGTH:   *Indicates pain Hip  Flexion: R =5/5, L =5/5.  Extension: R = 4/5 painful, L =5/5.  Abduction: R =5-/5 painful, L =4+/5.  Knee  Ext: R =5/5, L =5/5.  Flex: R =5/5, L =5/5.  ACCESSORY MOTION:  CPA at mid thoracic and upper lumbar spine most tender right side. No referral to hip.   FUNCTIONAL TESTING: - Sit to stand in 30 seconds: x13 mild pain - 5TSTS: 10 seconds, mild pain - able to squat with good form to  chair height, mild pain  FUNCTIONAL MOBILITY:  Bed mobility:supine <> sit I, slightly slow due to pain..  Transfers:sit <> stand I slightly slow due to discomfort.  Gait:WFL for indoor distances.  Objective measurements completed on examination: See above findings.  TREATMENT:  Denies latex allergy.  Therapeutic exercise:to centralize symptoms and improve ROM and strength required for successful completion of functional activities.  Treadmill levelup to2.74mh, 5%grade.For improved lower extremity mobility, muscular endurance, and weightbearing activity tolerance; and to induce the analgesic effect of aerobic exercise, stimulate improved joint nutrition, and prepare body structures and systems for following interventions. x6Minutes during subjective exam. -Squatswithchair behind to improve confidence and reduce fear of falling backwards while working on hip hinge. Cuing for hip hinge, equal weightbearing, neutral to mild genuvarum in bilateral knees, keeping tibias perpendicular to floor to reduce forward translation of knees during movement. x15 -Side stepping withblacktheraband wrapped around legs and held at umbilicus to activate core and hip stabilizers and abductors in functional weightbearing position. Cuing to activate abdominals. Stepping a few steps out and in as allowed by band x2 min  -Standingsingle armrows with scapular retraction for improved postural and shoulder girdle strengthening and mobility. Required  instruction for technique and cuing to retract, posteriorly tilt, and depress scapulae.x10 R and x10 Lat20#.Cuing to prevent spinal flexion/extension each rep. -Quadruped bird dogwith hip extensioncuing for abdominal brace.x10each side,plus time for instruction, rest, and transition. -supinedead bug (hip,kneesflexed to90 starting position then alternating hip/knee extension and contralateral shoulder flexion with abdominal activation)with ball held between knees and hands,legs to 45-50 degrees.x10each sideplus time for instruction, rest, and transition. cuing for care activation and to keep shins parallel to ground. - examination to assess progress (see above).    HOME EXERCISE PROGRAM Access [facility]: [facility] URL: [email or web address]  Date: 02/05/2019  Prepared by: SRosita Kea  Exercises   Squat with Chair Touch - 10-15 reps - 1 second hold - 3 Sets - 1x daily - 7x weekly   Bird Dog - 10-15 reps - 1 second hold - 3 Sets - 1x daily - 7x weekly   Dead Bug with Swiss Ball - 10-15 reps - 1 second hold - 3 Sets - 1x daily - 7x weekly   Side Plank on Knees - 10-15 reps - 1 second hold - 3 Sets - 1x daily - 3x weekly   Staggered Stance Single Arm Row with Anchored Resistance - 10-15 reps - 1 second hold - 3 Sets - 1x daily - 3x weekly   Seated Correct Posture   Walking - 30 min hold - 4x weekly    PT Education - 02/26/19 1612    Education Details  Exercise purpose/form. Self management techniques. Education on diagnosis, prognosis, POC, anatomy and physiology of current condition    Person(s) Educated  Patient    Methods  Explanation;Demonstration;Tactile cues;Verbal cues    Comprehension  Verbalized understanding;Returned demonstration       PT Short Term Goals - 02/26/19 1614      PT SHORT TERM GOAL #1   Title  Be independent with home exercise program completed at least 3 times per week for self-management of symptoms.    Baseline  Initial  HEP provided at initial eval (11/04/2018);    Time  2    Period  Weeks    Status  Achieved    Target Date  11/19/18      PT SHORT TERM GOAL #2   Title  Reduce pain to 8/10 with functional activities  to allow patient to complete valued functional tasks such as sit to stand with less difficulty.    Baseline  reports 10/10 at initial evaluation (11/04/2018); 9.5/10 (11/28/2018); 8/10 at night while trying to sleep (12/09/2018)    Time  2    Period  Weeks    Status  Achieved    Target Date  12/12/18      PT SHORT TERM GOAL #3   Title  Improve lumbar AROM to 75% to allow patient to complete valued activities with less difficulty.     Baseline  50% with end range pain at initial evaluaiton (11/04/2018);     Time  2    Period  Weeks    Status  Achieved    Target Date  11/19/18        PT Long Term Goals - 02/26/19 1619      PT LONG TERM GOAL #1   Title  Be independent with a long-term home exercise program for self-management of symptoms.     Baseline  Initial HEP provided at initial eval (11/04/2018); Initial HEP and progressions have been provided but not final progressions (11/28/2018); Initial HEP and progressions have been provided but not final progressions (12/09/2018; 01/06/2019; 02/26/2019)    Time  6    Period  Weeks    Status  Partially Met    Target Date  04/09/19      PT LONG TERM GOAL #2   Title  Demonstrate improved FOTO score by 10 units to demonstrate improvement in overall condition and self-reported functional ability.     Baseline  Foto = 47 (11/04/2018); too early to re-measure (11/28/2018); not measured (12/09/2018); 44 (01/06/2019, 02/26/2019 - questionnaire is only asking one question, no longer valid, see new goal).     Time  6    Period  Weeks    Status  Deferred    Target Date  03/03/19      PT LONG TERM GOAL #3   Title  Complete community, work and/or recreational activities without limitation due to current condition.     Baseline   limited in daily  activities and responsibilities, limited to 5# of lifting, disrupts sleep. 5# weight lift limit (11/04/2018); pt reports increased activty since starting PT, continues to have similar limitations to baseline (11/28/2018);; patient reports increased activity since strating PT, continues to have similar limitations to baseline but with slightly lower pain (12/09/2018); continues to have similar limitations but with less pain overall (01/06/2019); feels similar limitations but more better days than bad days now (02/26/2019).     Time  6    Period  Weeks    Status  Partially Met    Target Date  04/09/19      PT LONG TERM GOAL #4   Title  Patient will increase BLE gross strength to 4+/5 as to improve functional strength for independent gait, increased standing tolerance and increased ADL ability.    Baseline  Hip: Abduction: R = 3+/5 painful , L = 4+/5 painful!. Hip Flexion: R = 3+/5, L = 4/5. Knee Flex: R = 4/5, L = 5/5. (11/04/2018); see objective exam (01/06/2019);  see objecitve exam (02/26/2019);     Time  6    Period  Weeks    Status  Partially Met    Target Date  04/09/19      PT LONG TERM GOAL #5   Title  Be able to squat to chair height with proper form without limitation due to current  condition in order to improve ability to lift and complete transfers during usual activities.     Baseline  Difficulty with sit<>stand transfers due to pain but less than at initial eval (11/28/2018); improved sit <> stand transfers with reduced pain compared to initial eval (12/09/2018);; able to complete partial squat and improved sit to stand but continued pain (01/06/2019);  able to quat to chair height with mild discomfort (02/26/2019);     Time  6    Period  Weeks    Status  Achieved    Target Date  04/09/19      Additional Long Term Goals   Additional Long Term Goals  Yes      PT LONG TERM GOAL #6   Title  Demonstrate improved modified Oswestry Disability Index (mODI) score to equal or less than 20% to  demonstrate improvement in overall condition and self-reported functional ability.     Baseline  50% (02/26/2019);     Time  6    Period  Weeks    Status  New    Target Date  04/09/19            Plan - 02/26/19 1945    Clinical Impression Statement  Patient has attended 19 physical therapy treatment sessions this episode of care and has met all short term goals and made progress towards and met one long term goal. Her FOTO score has not changed since last progress assessment but it was generating a score based on one question about stair for some unknown reason, so the goal of improving this score has been abandoned due to not appearing to be reflective of her actual funciton and modified Oswestry Disability Index tracking has been initiated with new goals based on it as a functional outcome measure.  She has been attending PT with good attendance and consistent participation in her prescribed HEP. She continues to have days with elevated pain and decreased pain but has advanced in her ability to complete more and more advanced functional activities, demonstrating improved activity tolerance and functional capacity. She continues to report increased pain when picking up her son and with regular bending and lifting during household activities. She feels she is having more good days than before. She continues to have deficits in activity tolerance, strength, pain, and range of motion that are limiting her ability to complete her usual ADLs, IADLs, child care tasks, and usual social and community activities without difficulty. She will benefit from continued physical therapy to address remaining impairments and functional deficits and work towards stated goals.      Rehab Potential  Good    Clinical Impairments Affecting Rehab Potential  (+) denies leg symptoms; (-) apparent subjective pain exaggeration, external locus of control, focus on pain.     PT Frequency  2x / week    PT Duration  6 weeks     PT Treatment/Interventions  ADLs/Self Care Home Management;Moist Heat;Cryotherapy;Functional mobility training;Stair training;Therapeutic activities;Therapeutic exercise;Balance training;Neuromuscular re-education;Patient/family education;Manual techniques;Passive range of motion;Dry needling;Spinal Manipulations;Joint Manipulations;Other (comment)    PT Next Visit Plan  progress functional strengthening as tolerated/appropriate.     PT Home Exercise Plan  Med[employer]  Access Code: [email or web address]    [email or web address] and Agree with Plan of Care  Patient       Patient will benefit from skilled therapeutic intervention in order to improve the following deficits and impairments:  Decreased endurance, Decreased mobility, Difficulty walking, Hypomobility, Increased muscle spasms, Decreased range of motion, Impaired perceived functional ability, Impaired  tone, Obesity, Decreased activity tolerance, Decreased strength, Postural dysfunction, Pain, Impaired flexibility  Visit Diagnosis: Chronic bilateral low back pain without sciatica  Difficulty in walking, not elsewhere classified     Problem List Patient Active Problem List   Diagnosis Date Noted  . Menorrhagia with regular cycle 03/11/2018  . Microcytic anemia 02/25/2018  . Iron deficiency anemia due to chronic blood loss 02/18/2018    Nancy Nordmann, PT, DPT 02/26/2019, 7:47 PM  [facility] [address], [place], [address] Phone: [phone]   Fax:  [phone]  Name: Cathy Trevino MRN: [id number] Date of Birth: [birth year]

## 2019-02-27 ENCOUNTER — Other Ambulatory Visit: Payer: Self-pay

## 2019-03-05 ENCOUNTER — Other Ambulatory Visit: Payer: Self-pay

## 2019-03-05 ENCOUNTER — Encounter: Payer: Self-pay | Admitting: Physical Therapy

## 2019-03-05 ENCOUNTER — Ambulatory Visit: Payer: Medicaid Other | Admitting: Physical Therapy

## 2019-03-05 DIAGNOSIS — R262 Difficulty in walking, not elsewhere classified: Secondary | ICD-10-CM

## 2019-03-05 DIAGNOSIS — M545 Low back pain: Principal | ICD-10-CM

## 2019-03-05 DIAGNOSIS — G8929 Other chronic pain: Secondary | ICD-10-CM

## 2019-03-05 NOTE — Therapy (Signed)
[place] [facility] [address] [place], [address] Phone: [phone]   Fax:  [phone]  Physical Therapy Treatment  Patient Details  Name: Cathy Trevino MRN: [id number] Date of Birth: Jun 09, 1986 Referring Provider (PT): Margurite Auerbach, NP-C   Encounter Date: 03/05/2019  PT End of Session - 03/05/19 1041    Visit Number  20    Number of Visits  32    Date for PT Re-Evaluation  04/09/19    Authorization Type  Medicaid reporting period from 02/26/2019; auth through 03/05/2019    Authorization Time Period  Current Certification period: 02/26/2019 - 1/74/9449 (last PN/Re-Cert 6/75/9163)    Authorization - Visit Number  10    Authorization - Number of Visits  10    PT Start Time  0950    PT Stop Time  1045    PT Time Calculation (min)  55 min    Activity Tolerance  Patient limited by pain;Patient limited by fatigue;Patient tolerated treatment well    Behavior During Therapy  [facility] for tasks assessed/performed       Past Medical History:  Diagnosis Date  . Anemia   . Anxiety   . Asthma   . Depression   . Menorrhagia     Past Surgical History:  Procedure Laterality Date  . CESAREAN SECTION  2012  . TUBAL LIGATION      There were no vitals filed for this visit.  Subjective Assessment - 03/05/19 1019    Subjective  Patient reports she is feeling well with 3/10 pain in her back upon arrival. She reports no excessive pain or soreness following last treatment session.     Pertinent History  Patient is a 33 y.o. female who presents to outpatient physical therapy with a referral for chronic low back pain. This patient's chief complaints consist of pain, leading to the following functional deficits: difficulty with all activities that require movement or motion including lifting, bending, prolonged positions, sleeping. Relevant past medical history and comorbidities include "musculoskeletal issue around heart" told to  take tylenol when needed (sees cardiologist when he feels is necessary, last was July 2018), obesity, previous episodes of low back pain, history of depression and anxiety, anemia. Imaging: From MRI report dated 07/15/2018: "1. 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 2. Diffusely decreased T1 signal throughout the visualized bone marrow, nonspecific, but suspected to be related to patient's history of anemia. 3. Otherwise normal MRI of the lumbar spine."    Limitations  Sitting;Walking;Lifting;House hold activities;Standing    How long can you sit comfortably?  < 10 minutes on bad day. 10 min on a good day.     How long can you stand comfortably?  "I hate that" "I just have to keep moving" "not sure if it is the pain or everything just keeps moving"     How long can you walk comfortably?  20-25 min.     Diagnostic tests  MRI report dated 07/15/2018: "1. 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 2. Diffusely decreased T1 signal throughout the visualized bone marrow, nonspecific, but suspected to be related to patient's history of anemia. 3. Otherwise normal MRI of the lumbar spine."    Patient Stated Goals  Get rid of the pain so she can function better.  Pain Score  3     Pain Location  Back    Pain Orientation  Right    Pain Descriptors / Indicators  Dull    Pain Onset  More than a month ago          TREATMENT:  Denies latex allergy.  Therapeutic exercise:to centralize symptoms and improve ROM and strength required for successful completion of functional activities.  Treadmill levelup to2.78mh, 5%grade.For improved lower extremity mobility, muscular endurance, and weightbearing activity tolerance; and to induce the analgesic effect of aerobic exercise, stimulate improved joint  nutrition, and prepare body structures and systems for following interventions. x6.5Minutes during subjective exam.  Circuit 1:  -Squatswithchair behind to improve confidence and reduce fear of falling backwards while working on hip hinge. Cuing for hip hinge, equal weightbearing, neutral to mild genuvarum in bilateral knees, keeping tibias perpendicular to floor to reduce forward translation of knees during movement. X15, x10 with 3kg medicine ball at chest to overhead press. - seated lat pull with isolated scapular depression/elevation from pull2x15 at35#with cuing for proper form. -Side stepping withblacktheraband wrapped around legs and held at umbilicus to activate core and hip stabilizers and abductors in functional weightbearing position. Cuing to activate abdominals. Stepping a few steps out and in as allowed by band 2x2 min  - Chops (2x15) and scoops (2x15) with blue theraband, each direction. To improve trunk strength for rotational activities. Cuing for form and technique.  Circuit 2 (one time through); -Standingsingle armrows with scapular retraction for improved postural and shoulder girdle strengthening and mobility. Required instruction for technique and cuing to retract, posteriorly tilt, and depress scapulae.x15 each sideat20#.Cuing to prevent spinal flexion/extension each rep. -Quadruped bird dogwith chest on theraball and knees extended to plank position and light UE support on floor. Alternating hip extension/shoulder flexioncuing for abdominal brace.x9each side,plus time for instruction, rest, and transition. -supinedead bug (hip,kneesflexed to90 starting position then alternating hip/knee extension and contralateral shoulder flexion with abdominal activation)with ball held between knees and hands,legs to 45-50 degrees.x15each sideplus time for instruction, rest, and transition. cuing for care activation and to keep shins parallel to  ground.  HOME EXERCISE PROGRAM [facility] URL: [email or web address]  Date: 02/05/2019  Prepared by: SRosita Kea  Exercises   Squat with Chair Touch - 10-15 reps - 1 second hold - 3 Sets - 1x daily - 7x weekly   Bird Dog - 10-15 reps - 1 second hold - 3 Sets - 1x daily - 7x weekly   Dead Bug with Swiss Ball - 10-15 reps - 1 second hold - 3 Sets - 1x daily - 7x weekly   Side Plank on Knees - 10-15 reps - 1 second hold - 3 Sets - 1x daily - 3x weekly   Staggered Stance Single Arm Row with Anchored Resistance - 10-15 reps - 1 second hold - 3 Sets - 1x daily - 3x weekly   Seated Correct Posture   Walking - 30 min hold - 4x weekly     PT Education - 03/05/19 1041    Education Details  Exercise purpose/form. Self management techniques. Education on diagnosis, prognosis, POC, anatomy and physiology of current condition    Person(s) Educated  Patient    Methods  Explanation;Demonstration;Tactile cues;Verbal cues    Comprehension  Verbalized understanding;Returned demonstration       PT Short Term Goals - 02/26/19 1614      PT SHORT TERM GOAL #1   Title  Be independent with home exercise  program completed at least 3 times per week for self-management of symptoms.    Baseline  Initial HEP provided at initial eval (11/04/2018);    Time  2    Period  Weeks    Status  Achieved    Target Date  11/19/18      PT SHORT TERM GOAL #2   Title  Reduce pain to 8/10 with functional activities to allow patient to complete valued functional tasks such as sit to stand with less difficulty.    Baseline  reports 10/10 at initial evaluation (11/04/2018); 9.5/10 (11/28/2018); 8/10 at night while trying to sleep (12/09/2018)    Time  2    Period  Weeks    Status  Achieved    Target Date  12/12/18      PT SHORT TERM GOAL #3   Title  Improve lumbar AROM to 75% to allow patient to complete valued activities with less difficulty.     Baseline  50% with end range pain  at initial evaluaiton (11/04/2018);     Time  2    Period  Weeks    Status  Achieved    Target Date  11/19/18        PT Long Term Goals - 02/26/19 1619      PT LONG TERM GOAL #1   Title  Be independent with a long-term home exercise program for self-management of symptoms.     Baseline  Initial HEP provided at initial eval (11/04/2018); Initial HEP and progressions have been provided but not final progressions (11/28/2018); Initial HEP and progressions have been provided but not final progressions (12/09/2018; 01/06/2019; 02/26/2019)    Time  6    Period  Weeks    Status  Partially Met    Target Date  04/09/19      PT LONG TERM GOAL #2   Title  Demonstrate improved FOTO score by 10 units to demonstrate improvement in overall condition and self-reported functional ability.     Baseline  Foto = 47 (11/04/2018); too early to re-measure (11/28/2018); not measured (12/09/2018); 44 (01/06/2019, 02/26/2019 - questionnaire is only asking one question, no longer valid, see new goal).     Time  6    Period  Weeks    Status  Deferred    Target Date  03/03/19      PT LONG TERM GOAL #3   Title  Complete community, work and/or recreational activities without limitation due to current condition.     Baseline   limited in daily activities and responsibilities, limited to 5# of lifting, disrupts sleep. 5# weight lift limit (11/04/2018); pt reports increased activty since starting PT, continues to have similar limitations to baseline (11/28/2018);; patient reports increased activity since strating PT, continues to have similar limitations to baseline but with slightly lower pain (12/09/2018); continues to have similar limitations but with less pain overall (01/06/2019); feels similar limitations but more better days than bad days now (02/26/2019).     Time  6    Period  Weeks    Status  Partially Met    Target Date  04/09/19      PT LONG TERM GOAL #4   Title  Patient will increase BLE gross strength to  4+/5 as to improve functional strength for independent gait, increased standing tolerance and increased ADL ability.    Baseline  Hip: Abduction: R = 3+/5 painful , L = 4+/5 painful!. Hip Flexion: R = 3+/5, L = 4/5. Knee Flex: R =  4/5, L = 5/5. (11/04/2018); see objective exam (01/06/2019);  see objecitve exam (02/26/2019);     Time  6    Period  Weeks    Status  Partially Met    Target Date  04/09/19      PT LONG TERM GOAL #5   Title  Be able to squat to chair height with proper form without limitation due to current condition in order to improve ability to lift and complete transfers during usual activities.     Baseline  Difficulty with sit<>stand transfers due to pain but less than at initial eval (11/28/2018); improved sit <> stand transfers with reduced pain compared to initial eval (12/09/2018);; able to complete partial squat and improved sit to stand but continued pain (01/06/2019);  able to quat to chair height with mild discomfort (02/26/2019);     Time  6    Period  Weeks    Status  Achieved    Target Date  04/09/19      Additional Long Term Goals   Additional Long Term Goals  Yes      PT LONG TERM GOAL #6   Title  Demonstrate improved modified Oswestry Disability Index (mODI) score to equal or less than 20% to demonstrate improvement in overall condition and self-reported functional ability.     Baseline  50% (02/26/2019);     Time  6    Period  Weeks    Status  New    Target Date  04/09/19            Plan - 03/05/19 1321    Clinical Impression Statement  Patient tolerated treatment well and is making progress toward goals at this point. She came in with a low pain level today and it did not increase throughout treatment. She was able to advance several exercises. She required cuing for proper completion of activities and to introduce progressions in exercise. She continues to have deficits in activity tolerance, strength, pain, and range of motion that are limiting her  ability to complete her usual ADLs, IADLs, child care tasks, and usual social and community activities without difficulty. She will benefit from continued physical therapy to address remaining impairments and functional deficits and work towards stated goals.     Rehab Potential  Good    Clinical Impairments Affecting Rehab Potential  (+) denies leg symptoms; (-) apparent subjective pain exaggeration, external locus of control, focus on pain.     PT Frequency  2x / week    PT Duration  6 weeks    PT Treatment/Interventions  ADLs/Self Care Home Management;Moist Heat;Cryotherapy;Functional mobility training;Stair training;Therapeutic activities;Therapeutic exercise;Balance training;Neuromuscular re-education;Patient/family education;Manual techniques;Passive range of motion;Dry needling;Spinal Manipulations;Joint Manipulations;Other (comment)    PT Next Visit Plan  progress functional strengthening as tolerated/appropriate.     PT Home Exercise Plan  Medbridge  Access Code: [email or web address]    [email or web address] and Agree with Plan of Care  Patient       Patient will benefit from skilled therapeutic intervention in order to improve the following deficits and impairments:  Decreased endurance, Decreased mobility, Difficulty walking, Hypomobility, Increased muscle spasms, Decreased range of motion, Impaired perceived functional ability, Impaired tone, Obesity, Decreased activity tolerance, Decreased strength, Postural dysfunction, Pain, Impaired flexibility  Visit Diagnosis: Chronic bilateral low back pain without sciatica  Difficulty in walking, not elsewhere classified     Problem List Patient Active Problem List   Diagnosis Date Noted  . Menorrhagia with regular cycle 03/11/2018  . Microcytic anemia 02/25/2018  .  Iron deficiency anemia due to chronic blood loss 02/18/2018    Nancy Nordmann, PT, DPT 03/05/2019, 1:22 PM  [place] [address] [place], [address] Phone: [phone]   Fax:  [phone]  Name: SINEAD HOCKMAN MRN: [id number] Date of Birth: 12-04-1986

## 2019-03-10 ENCOUNTER — Encounter: Payer: Self-pay | Admitting: Physical Therapy

## 2019-03-10 DIAGNOSIS — M545 Low back pain, unspecified: Secondary | ICD-10-CM

## 2019-03-10 DIAGNOSIS — R262 Difficulty in walking, not elsewhere classified: Secondary | ICD-10-CM

## 2019-03-10 DIAGNOSIS — G8929 Other chronic pain: Secondary | ICD-10-CM

## 2019-03-10 NOTE — Therapy (Signed)
[place] [facility] [address] [place], [address] Phone: [phone]   Fax:  [phone]  Patient Details  Name: Cathy Trevino MRN: [id number] Date of Birth: 04-Nov-1986 Referring Provider:  No ref. provider found  Encounter Date: 03/10/2019  Reason for telecommunication instead of in person visit: Clinic is closed due to precautions to prevent COVID-19 spread during the pandemic.  Called pt to update about clinic closing for in-person visits due to COVID-19 pandemic. Spoke to pt letting pt know their appointments are canceled at this time, we are monitoring the phones/voicemail, are looking into telehealth options, and would like to know if they is interested in that. Also let pt know that I am available by phone to discuss any concerns, answer questions, provide advice, modify exercise program, and problem solve remotely; that I want to and be there with them as much as possible without in-person visits. Advised pt to call if they have any PT needs or questions. Let pt know we will call back when the clinic re-opens.  Pt states she is doing well and does not feel she needs any additional updates to her HEP at this time.   Cira Rue, PT, DPT 03/10/2019, 2:46 PM  [facility] [facility] [address], [place], [address] Phone: [phone]   Fax:  [phone]

## 2019-03-11 ENCOUNTER — Encounter: Payer: Medicaid Other | Admitting: Physical Therapy

## 2019-03-13 ENCOUNTER — Encounter: Payer: Medicaid Other | Admitting: Physical Therapy

## 2019-03-24 ENCOUNTER — Encounter: Payer: Self-pay | Admitting: Physical Therapy

## 2019-03-24 NOTE — Therapy (Signed)
[facility] MAIN [facility] SERVICES [address] [place], [place], [address] Phone: [phone]   Fax:  [phone]  Patient Details  Name: Cathy Trevino MRN: [id number] Date of Birth: Apr 16, 1986 Referring Provider:  No ref. provider found  Encounter Date: 03/24/2019  Called pt back after I was relayed a message that pt had called in anxious to get back in for treatment. Pt stated she had called wondering if we were re-opening soon. She said she is doing okay and her pain is variable, usually no higher than 5/10. She states she feels her HEP is adequately challenging at this point. Informed her about telehealth being in the process of getting up and running and the possibility of working with Wes instead of me. Pt thanked me for calling to check on her.   [name]. [name], PT, DPT 03/24/19, 4:37 PM  [place] [facility] MAIN [facility] SERVICES [address] [place], [place], [address] Phone: [phone]   Fax:  [phone]

## 2019-03-25 ENCOUNTER — Other Ambulatory Visit: Payer: Self-pay | Admitting: Unknown Physician Specialty

## 2019-03-25 DIAGNOSIS — H9120 Sudden idiopathic hearing loss, unspecified ear: Secondary | ICD-10-CM

## 2019-03-25 DIAGNOSIS — R430 Anosmia: Secondary | ICD-10-CM

## 2019-04-01 NOTE — Therapy (Signed)
[facility] MAIN [facility] SERVICES [address] [place], [place], [address] Phone: [phone]   Fax:  [phone]  Patient Details  Name: Cathy Trevino MRN: [id number] Date of Birth: 01/22/1986 Referring Provider:  No ref. provider found  Encounter Date: 04/01/2019  The [facility] outpatient clinics are closed at this time due to the COVID-19 epidemic. The patient was contacted in regards to their therapy services. The patient is in agreement that they are safe and consent to being on hold for therapy services until the [facility] outpatient facilities reopen. At which time, the patient will be contacted to schedule an appointment to resume therapy services.   Patient Interested in Telehealth but does not have access to the necessary devices to perform. Patient requests update on exercises, will let therapist know.   Myrene Galas, PT DPT 04/01/2019, 2:24 PM  [facility] MAIN [facility] SERVICES [address] [place], [place], [address] Phone: [phone]   Fax:  [phone]

## 2019-04-05 ENCOUNTER — Other Ambulatory Visit: Payer: Self-pay

## 2019-04-05 ENCOUNTER — Ambulatory Visit
Admission: RE | Admit: 2019-04-05 | Discharge: 2019-04-05 | Disposition: A | Discharge status: Home / Self Care | Payer: Medicaid Other | Source: Ambulatory Visit | Attending: Unknown Physician Specialty | Admitting: Unknown Physician Specialty

## 2019-04-05 DIAGNOSIS — R430 Anosmia: Secondary | ICD-10-CM | POA: Diagnosis present

## 2019-04-05 DIAGNOSIS — H9120 Sudden idiopathic hearing loss, unspecified ear: Secondary | ICD-10-CM | POA: Diagnosis present

## 2019-04-05 MED ORDER — GADOBUTROL 1 MMOL/ML IV SOLN
10.0000 mL | Freq: Once | INTRAVENOUS | Status: AC | PRN
  Administered 2019-04-05: 10 mL via INTRAVENOUS

## 2019-04-08 ENCOUNTER — Encounter: Payer: Self-pay | Admitting: Physical Therapy

## 2019-04-08 NOTE — Therapy (Signed)
[facility] MAIN [facility] SERVICES [address] [place], [place], [address] Phone: [phone]   Fax:  [phone]  Patient Details  Name: Cathy Trevino MRN: [id number] Date of Birth: 1985-12-25 Referring Provider:  No ref. provider found  Encounter Date: 04/08/2019  Reason for telecommunication instead of in person visit: Clinic is closed due to precautions to prevent COVID-19 spread during the pandemic.  Called patient to check on her and help update HEP as she requested. Patient states she is doing well with pain up to 5-6/10 on some days and low around 1-2/10 on others. Continues to awake with varying levels of pain that lasts all day at that level. States she is getting a bit bored with her HEP and would like some different options. Several exercises were added and pt was educated on their performance. Emailed her the link to updated video and text descriptions of the exercises and allowed her to try the new ones and talk her through it on the phone. She states she is having a high pain day but was able to complete the new exercises without increased pain or pain in other parts of her body. See updated HEP below.   HOME EXERCISE PROGRAM Access Code: [facility]  URL: [email or web address]  Date: 04/08/2019  Prepared by: Norton Blizzard   Exercises  Squat with Chair Touch - 10-15 reps - 1 second hold - 3 Sets - 1x daily - 7x weekly  Bird Dog - 10-15 reps - 1 second hold - 3 Sets - 1x daily - 7x weekly  Dead Bug with Swiss Ball - 10-15 reps - 1 second hold - 3 Sets - 1x daily - 7x weekly  Side Plank on Knees - 10-15 reps - 1 second hold - 3 Sets - 1x daily - 3x weekly  Staggered Stance Single Arm Row with Anchored Resistance - 10-15 reps - 1 second hold - 3 Sets - 1x daily - 3x weekly  Seated Correct Posture  Walking - 30 min hold - 4x weekly  Standing Diagonal Chop - 3 sets - 10 reps - 1x daily  Reverse Chop with Resistance - 3 sets - 10  reps - 1x daily  Supine Bridge - 1-3 sets - 10 reps - 1-5 sec hold - 1x daily  Reverse Lunge - 1-3 sets - 10 reps  Push Up on Table - 1-3 sets - 5-10 reps - 1x daily    [name]. [name], PT, DPT 04/08/19, 4:34 PM   [facility] MAIN [facility] SERVICES [address] [place], [place], [address] Phone: [phone]   Fax:  [phone]

## 2019-04-23 ENCOUNTER — Other Ambulatory Visit: Payer: Self-pay

## 2019-04-23 ENCOUNTER — Ambulatory Visit: Payer: Medicaid Other | Attending: Nurse Practitioner | Admitting: Physical Therapy

## 2019-04-23 ENCOUNTER — Encounter: Payer: Self-pay | Admitting: Physical Therapy

## 2019-04-23 DIAGNOSIS — G8929 Other chronic pain: Secondary | ICD-10-CM | POA: Diagnosis present

## 2019-04-23 DIAGNOSIS — M545 Low back pain, unspecified: Secondary | ICD-10-CM

## 2019-04-23 DIAGNOSIS — R262 Difficulty in walking, not elsewhere classified: Secondary | ICD-10-CM | POA: Diagnosis present

## 2019-04-23 NOTE — Therapy (Signed)
[facility] [address], [place], [address] Phone: [phone]   Fax:  [phone]  Physical Therapy Treatment / Progress Note / Re-Certification Reporting Period: 02/26/2019 - 04/23/2019  Patient Details  Name: Cathy Trevino MRN: [id number] Date of Birth: [birth year] Referring Provider (PT): Margurite Auerbach, NP-C   Encounter Date: 04/23/2019  PT End of Session - 04/23/19 1803    Visit Number  21    Number of Visits  32    Date for PT Re-Evaluation  04/09/19    Authorization Type  Medicaid reporting period from 02/26/2019 (new period from 04/23/2019); auth through 06/03/2019    Authorization Time Period  Current Certification period: 04/23/2019 - 6/78/9381 (last PN/Re-Cert 0/12/7508)    Authorization - Visit Number  1    Authorization - Number of Visits  10    PT Start Time  1535    PT Stop Time  1615    PT Time Calculation (min)  40 min    Activity Tolerance  Patient limited by pain;Patient limited by fatigue;Patient tolerated treatment well    Behavior During Therapy  [facility] for tasks assessed/performed       Past Medical History:  Diagnosis Date  . Anemia   . Anxiety   . Asthma   . Depression   . Menorrhagia     Past Surgical History:  Procedure Laterality Date  . CESAREAN SECTION  2012  . TUBAL LIGATION      There were no vitals filed for this visit.  Subjective Assessment - 04/23/19 1548    Subjective  Patient reports 3/10 pain in usual R upper lumbar/lower thoracic region. States she has been staying safe by staying home and working on her HEP pretty regularly. She continues to have pain that correlates with needing to pick up her youngest son when he is throwing a tantrum.     Pertinent History  Patient is a 33 y.o. female who presents to outpatient physical therapy with a referral for chronic low back pain. This patient's chief complaints consist of pain, leading to the following functional deficits:  difficulty with all activities that require movement or motion including lifting, bending, prolonged positions, sleeping. Relevant past medical history and comorbidities include "musculoskeletal issue around heart" told to take tylenol when needed (sees cardiologist when he feels is necessary, last was July 2018), obesity, previous episodes of low back pain, history of depression and anxiety, anemia. Imaging: From MRI report dated 07/15/2018: "1. 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 2. Diffusely decreased T1 signal throughout the visualized bone marrow, nonspecific, but suspected to be related to patient's history of anemia. 3. Otherwise normal MRI of the lumbar spine."    Limitations  Sitting;Walking;Lifting;House hold activities;Standing    How long can you sit comfortably?  < 10 minutes on bad day. 10 min on a good day.     How long can you stand comfortably?  "I hate that" "I just have to keep moving" "not sure if it is the pain or everything just keeps moving"     How long can you walk comfortably?  20-25 min.     Diagnostic tests  MRI report dated 07/15/2018: "1. 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 2. Diffusely decreased T1 signal throughout the visualized bone marrow, nonspecific, but suspected  to be related to patient's history of anemia. 3. Otherwise normal MRI of the lumbar spine."    Patient Stated Goals  Get rid of the pain so she can function better.     Currently in Pain?  Yes    Pain Score  3     Pain Location  Back    Pain Orientation  Right    Pain Descriptors / Indicators  Dull    Pain Type  Chronic pain    Pain Onset  More than a month ago    Pain Frequency  Intermittent    Aggravating Factors   any position too long, getting up in the morning ,repeatedly lifting son,  bending ,sitting is worse than standing.     Pain Relieving Factors  heating pad, 3-sided pillow when having to sit a long time     Effect of Pain on Daily Activities  continues to be limited with more difficulty than prior to pain onset. Interferes with ADLs, IADLs, caring for familiy, participating in usual activities. 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  Extension: =50%, mild deviation to R  Rotation: R =100% ERP, L =100% ERP  Side Flexion: R =just proximal to patella (ERP on R side), L =proximal to patella, (ERP on R side)  PERIPHERAL JOINT MOTION (AROM/PROM in degrees):  *Indicates pain BLE grossly WFL.   FUNCTIONALTESTING: - Sit to stand in 30 seconds: x11 mild pain - able to squat with good form to chair height, mild pain x10.  - Push up: x10 modified to  height of treadmill bar - Side plank elbow to knees: 7 seconds each side - Back lunge: excessive forward bend, unable to touch knee to ground, unsteady, requires unilateral UE support.   FUNCTIONAL MOBILITY:  Bed mobility:supine <> sit I,slightly slow due to pain..  Transfers:sit <> stand Islightly slowdue to discomfort.  Gait:WFL for indoor distances.  Objective measurements completed on examination: See above findings.  TREATMENT:  Denies latex allergy.  Therapeutic exercise:to centralize symptoms and improve ROM and strength required for successful completion of functional activities.  Treadmill levelup to2.46mh, nograde.For  improved lower extremity mobility, muscular endurance, and weightbearing activity tolerance; and to induce the analgesic effect of aerobic exercise, stimulate improved joint nutrition, and prepare body structures and systems for following interventions. x6Minutes during subjective exam. - Assessment of AROM for lumbar spine (see above) to assess progress. - Modified push ups from plank position hands on treadmill bars, 2x10 with cuing for 45 degrees shoulder abduction.  -Squatswithchair behind to improve confidence and reduce fear of falling backwards while working on hip hinge. Cuing for hip hinge, equal weightbearing, neutral to mild genuvarum in bilateral knees, keeping tibias perpendicular to floor to reduce forward translation of knees during movement. 2x15 - back lunges with ipsilateral UE support with and without slider, and facing bars with BUE support. Excessive forward lean noted. 2x10 each side.  - side plank reps knees to elbow x 10 each side.  -Quadruped bird dogwith shoulder flexion/hip extensioncuing for abdominal brace.2x10each side,plus time for instruction, rest, and transition. External cuing with bolster on sacrum.  -Quadruped knee lift-offs from the ground 1-2 second holds x 10. Cuing for proper form. -  education about HEP and updates   HOME EXERCISE PROGRAM Access Code: [facility]  URL: [email or web address]  Date: 04/08/2019  Prepared by: Rosita Kea   Exercises  Squat with Chair Touch - 10-15 reps - 1 second hold - 3 Sets - 1x daily - 7x weekly  Bird Dog - 10-15 reps - 1 second hold - 3 Sets - 1x daily - 7x weekly  Dead Bug with Swiss Ball - 10-15 reps - 1 second hold - 3 Sets - 1x daily - 7x weekly  Side Plank on Knees - 10-15 reps - 1 second hold - 3 Sets - 1x daily - 3x weekly  Staggered Stance Single Arm Row with Anchored Resistance - 10-15 reps - 1 second hold - 3 Sets - 1x daily - 3x weekly  Seated Correct Posture  Walking - 30 min hold - 4x weekly  Standing Diagonal Chop - 3 sets - 10 reps - 1x daily  Reverse Chop with Resistance - 3 sets - 10 reps - 1x daily  Supine Bridge - 1-3 sets - 10 reps - 1-5 sec hold - 1x daily  Reverse Lunge - 1-3 sets - 10 reps  Push Up on Table - 1-3 sets - 5-10 reps - 1x daily    Patient tolerated treatment well with pain maintained at 3/10 until pain suddenly increased to 4/10 during quadruped knee lift offs. She reported it as a spasming pain that did not get better or worse following. She was able to continue exercises but knee lift-offs were discontinued at that point. She found lunges, planks, push ups, and lift-offs to be very challenging but was able to improve form with cuing. She seemed excited about getting to work on some new exercise variations.     PT Education - 04/23/19 1805    Education Details  Exercise purpose/form. Self management techniques. Education on diagnosis, prognosis, POC, anatomy and physiology of current condition    Person(s) Educated  Patient    Methods  Explanation;Demonstration;Tactile cues;Verbal cues    Comprehension  Verbalized understanding;Returned demonstration;Verbal cues required;Tactile cues required;Need further instruction       PT Short Term Goals - 04/23/19 1806      PT SHORT  TERM GOAL #1   Title  Be independent with home exercise program completed at least 3 times per week for self-management of symptoms.    Baseline  Initial HEP provided at  initial eval (11/04/2018);    Time  2    Period  Weeks    Status  Achieved    Target Date  11/19/18      PT SHORT TERM GOAL #2   Title  Reduce pain to 8/10 with functional activities to allow patient to complete valued functional tasks such as sit to stand with less difficulty.    Baseline  reports 10/10 at initial evaluation (11/04/2018); 9.5/10 (11/28/2018); 8/10 at night while trying to sleep (12/09/2018)    Time  2    Period  Weeks    Status  Achieved    Target Date  12/12/18      PT SHORT TERM GOAL #3   Title  Improve lumbar AROM to 75% to allow patient to complete valued activities with less difficulty.     Baseline  50% with end range pain at initial evaluaiton (11/04/2018);     Time  2    Period  Weeks    Status  Achieved    Target Date  11/19/18        PT Long Term Goals - 04/23/19 1806      PT LONG TERM GOAL #1   Title  Be independent with a long-term home exercise program for self-management of symptoms.     Baseline  Initial HEP provided at initial eval (11/04/2018); Initial HEP and progressions have been provided but not final progressions (11/28/2018); Initial HEP and progressions have been provided but not final progressions (12/09/2018; 01/06/2019; 02/26/2019; 04/23/2019)    Time  6    Period  Weeks    Status  Partially Met    Target Date  06/03/19      PT LONG TERM GOAL #2   Title  Demonstrate improved FOTO score by 10 units to demonstrate improvement in overall condition and self-reported functional ability.     Baseline  Foto = 47 (11/04/2018); too early to re-measure (11/28/2018); not measured (12/09/2018); 44 (01/06/2019, 02/26/2019 - questionnaire is only asking one question, no longer valid, see new goal).     Time  6    Period  Weeks    Status  Deferred    Target Date  03/03/19      PT  LONG TERM GOAL #3   Title  Complete community, work and/or recreational activities without limitation due to current condition.     Baseline   limited in daily activities and responsibilities, limited to 5# of lifting, disrupts sleep. 5# weight lift limit (11/04/2018); pt reports increased activty since starting PT, continues to have similar limitations to baseline (11/28/2018);; patient reports increased activity since strating PT, continues to have similar limitations to baseline but with slightly lower pain (12/09/2018); continues to have similar limitations but with less pain overall (01/06/2019); feels similar limitations but more better days than bad days now (02/26/2019).; continues to have difficulty picking up son (04/23/2019);     Time  6    Period  Weeks    Status  Partially Met    Target Date  06/03/19      PT LONG TERM GOAL #4   Title  Patient will increase BLE gross strength to 4+/5 as to improve functional strength for independent gait, increased standing tolerance and increased ADL ability.    Baseline  Hip: Abduction: R = 3+/5 painful , L = 4+/5 painful!. Hip Flexion: R = 3+/5, L = 4/5. Knee Flex: R = 4/5, L = 5/5. (11/04/2018); see objective exam (01/06/2019);  see objective  exam (02/26/2019); did not measure formally (04/23/2019)    Time  6    Period  Weeks    Status  Partially Met    Target Date  06/03/19      PT LONG TERM GOAL #5   Title  Be able to squat to chair height with proper form without limitation due to current condition in order to improve ability to lift and complete transfers during usual activities.     Baseline  Difficulty with sit<>stand transfers due to pain but less than at initial eval (11/28/2018); improved sit <> stand transfers with reduced pain compared to initial eval (12/09/2018);; able to complete partial squat and improved sit to stand but continued pain (01/06/2019);  able to quat to chair height with mild discomfort (02/26/2019);     Time  6    Period   Weeks    Status  Achieved    Target Date  04/09/19      Additional Long Term Goals   Additional Long Term Goals  Yes      PT LONG TERM GOAL #6   Title  Demonstrate improved modified Oswestry Disability Index (mODI) score to equal or less than 20% to demonstrate improvement in overall condition and self-reported functional ability.     Baseline  50% (02/26/2019); 42% (04/23/2019);     Time  6    Period  Weeks    Status  Partially Met    Target Date  06/03/19      PT LONG TERM GOAL #7   Title  Patient will be able to complete 5 back lunges with knee to floor and light UE support with good form to improve her activity tolerance for activities requiring assymetrical loading such as picking up her son.     Baseline  Back lunge: excessive forward bend, unable to touch knee to ground, unsteady, requires unilateral UE support(04/23/2019);     Time  6    Period  Weeks    Status  New    Target Date  06/03/19            Plan - 04/23/19 1759    Clinical Impression Statement   Patient has attended 20 physical therapy sessions this episode of care and is returning to physical therapy following 7 weeks of being unable to attend (since 03/05/2019) due to clinic closure related to social distancing precautions during the [place] pandemic. Patient states she has continued to work on her HEP since her last visit and her pain has fluctuated between 3-5/10. She continues to have difficulty and increased pain when she picks up her youngest son when he is having tantrums. Patient continue to demonstrate weakness during functional activities and decreased core strength and endurance. She can hold knees to elbow side plank for 7 seconds each side, hold quadruped without knees touching up to 2 seconds at a time, and complete 10 modified pushups from plank position with hands on elevated surface and demo some lack of trunk stability during this exercise. She is unable to complete a full lunge without support  demonstrating weakness in her ability to completed daily activieis requiring single leg stance. Patient appears to have chronic pain but is continuing to progress to being able to do more functional activity without increased pain. Patient would benefit from continued physical therapy to address remaining impairments and functional limitations to work towards stated goals and return to PLOF or maximal functional independence.     Rehab Potential  Good  Clinical Impairments Affecting Rehab Potential  (+) denies leg symptoms; (-) apparent subjective pain exaggeration, external locus of control, focus on pain.     PT Frequency  2x / week    PT Duration  6 weeks    PT Treatment/Interventions  ADLs/Self Care Home Management;Moist Heat;Cryotherapy;Functional mobility training;Stair training;Therapeutic activities;Therapeutic exercise;Balance training;Neuromuscular re-education;Patient/family education;Manual techniques;Passive range of motion;Dry needling;Spinal Manipulations;Joint Manipulations;Other (comment)    PT Next Visit Plan  progress functional strengthening as tolerated/appropriate. Continue working on lunges, planks, push ups    PT [place]  Access Code: [email or web address]    Consulted and Agree with Plan of Care  Patient       Patient will benefit from skilled therapeutic intervention in order to improve the following deficits and impairments:  Decreased endurance, Decreased mobility, Difficulty walking, Hypomobility, Increased muscle spasms, Decreased range of motion, Impaired perceived functional ability, Impaired tone, Obesity, Decreased activity tolerance, Decreased strength, Postural dysfunction, Pain, Impaired flexibility  Visit Diagnosis: Chronic bilateral low back pain without sciatica  Difficulty in walking, not elsewhere classified     Problem List Patient Active Problem List   Diagnosis Date Noted  . Menorrhagia with regular cycle 03/11/2018  . Microcytic  anemia 02/25/2018  . Iron deficiency anemia due to chronic blood loss 02/18/2018   Everlean Alstrom. Graylon Good, PT, DPT 04/23/19, 6:17 PM   [facility] [address] [place], [address] Phone: [phone]   Fax:  [phone]  Name: Cathy Trevino MRN: [id number] Date of Birth: 12-Oct-1986

## 2019-04-24 ENCOUNTER — Ambulatory Visit: Admission: RE | Admit: 2019-04-24 | Payer: Medicaid Other | Source: Ambulatory Visit

## 2019-04-28 ENCOUNTER — Other Ambulatory Visit: Payer: Self-pay

## 2019-04-28 ENCOUNTER — Encounter: Payer: Self-pay | Admitting: Physical Therapy

## 2019-04-28 ENCOUNTER — Ambulatory Visit: Payer: Medicaid Other | Admitting: Physical Therapy

## 2019-04-28 ENCOUNTER — Telehealth: Payer: Self-pay | Admitting: Physical Therapy

## 2019-04-28 DIAGNOSIS — M545 Low back pain: Secondary | ICD-10-CM | POA: Diagnosis not present

## 2019-04-28 DIAGNOSIS — G8929 Other chronic pain: Secondary | ICD-10-CM

## 2019-04-28 DIAGNOSIS — R262 Difficulty in walking, not elsewhere classified: Secondary | ICD-10-CM

## 2019-04-28 NOTE — Telephone Encounter (Signed)
Made in error

## 2019-04-28 NOTE — Therapy (Signed)
[facility] [address] [place], [address] Phone: [phone]   Fax:  [phone]  Physical Therapy Treatment  Patient Details  Name: Cathy Trevino MRN: [id number] Date of Birth: 07-21-1986 Referring Provider (PT): Margurite Auerbach, NP-C   Encounter Date: 04/28/2019  PT End of Session - 04/28/19 1132    Visit Number  22    Number of Visits  32    Date for PT Re-Evaluation  04/09/19    Authorization Type  Medicaid reporting period from 02/26/2019 (new period from 04/23/2019); auth through 06/03/2019    Authorization Time Period  Current Certification period: 04/23/2019 - 3/82/5053 (last PN/Re-Cert 08/24/6733)    Authorization - Visit Number  2    Authorization - Number of Visits  10    PT Start Time  0930    PT Stop Time  1020    PT Time Calculation (min)  50 min    Activity Tolerance  Patient limited by pain;Patient limited by fatigue;Patient tolerated treatment well    Behavior During Therapy  [facility] for tasks assessed/performed       Past Medical History:  Diagnosis Date  . Anemia   . Anxiety   . Asthma   . Depression   . Menorrhagia     Past Surgical History:  Procedure Laterality Date  . CESAREAN SECTION  2012  . TUBAL LIGATION      There were no vitals filed for this visit.  Subjective Assessment - 04/28/19 0932    Subjective  Patient report 3/10 pain in the right upper lumbar/lower thoracic region. She reports some bilateral quad soreness following last treatment session that lasted about 2 days. She states her back pain came back down from a 5/10 after she used a heating pad for 1 hour following last treatment session. She reports her HEP is going well. She is still struggling with the side plank position. She did not have to lift her son off the floor as often since last treatment session.     Pertinent History  Patient is a 33 y.o. female who presents to outpatient physical therapy with a  referral for chronic low back pain. This patient's chief complaints consist of pain, leading to the following functional deficits: difficulty with all activities that require movement or motion including lifting, bending, prolonged positions, sleeping. Relevant past medical history and comorbidities include "musculoskeletal issue around heart" told to take tylenol when needed (sees cardiologist when he feels is necessary, last was July 2018), obesity, previous episodes of low back pain, history of depression and anxiety, anemia. Imaging: From MRI report dated 07/15/2018: "1. 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 2. Diffusely decreased T1 signal throughout the visualized bone marrow, nonspecific, but suspected to be related to patient's history of anemia. 3. Otherwise normal MRI of the lumbar spine."    Limitations  Sitting;Walking;Lifting;House hold activities;Standing    How long can you sit comfortably?  < 10 minutes on bad day. 10 min on a good day.     How long can you stand comfortably?  "I hate that" "I just have to keep moving" "not sure if it is the pain or everything just keeps moving"     How long can you walk comfortably?  20-25 min.     Diagnostic tests  MRI report dated 07/15/2018: "1. Central/left subarticular disc protrusion at L5-S1 extending into the  left lateral recess and impinging upon the descending left S1 nerve root. 2. Diffusely decreased T1 signal throughout the visualized bone marrow, nonspecific, but suspected to be related to patient's history of anemia. 3. Otherwise normal MRI of the lumbar spine."    Patient Stated Goals  Get rid of the pain so she can function better.     Currently in Pain?  Yes    Pain Score  3     Pain Location  Back    Pain Orientation  Right;Mid    Pain Descriptors / Indicators  Dull    Pain Type  Chronic pain    Pain Onset  More than a month ago        TREATMENT:  Denies latex allergy.  Therapeutic exercise:to centralize symptoms and improve ROM and strength required for successful completion of functional activities.  Treadmill levelup to2.58mh, 5%grade.For improved lower extremity mobility, muscular endurance, and weightbearing activity tolerance; and to induce the analgesic effect of aerobic exercise, stimulate improved joint nutrition, and prepare body structures and systems for following interventions. x6Minutes during subjective exam. - Modified push ups from plank position hands on treadmill bars, 2x10 with cuing for 45 degrees shoulder abduction. Improved from last session. To improve trunk stability and core strength during dynamic pushing motions mimicking daily life.  -Squatswithchair behind to improve confidence and reduce fear of falling backwards while working on hip hinge. Cuing for hip hinge, equal weightbearing, neutral to mild genuvarum in bilateral knees, keeping tibias perpendicular to floor to reduce forward translation of knees during movement. x10, 2x5 with PVC bar overhead to activate lower traps and posterior trunk muscles. Patient required creative cuing to address lack of trunk stability while attempting to complete exercise. "Keep bar pushed to ceiling" was most successful this session. Did note some symptoms resembling nerve irritation in the right shoulder/UE during this exercise that resolved with rest. Discussed, educated, and agreed to monitor this at this point.  - back lunge to split squat with ipsilateral UE support. Attempted as lunge to floor, but found quads were too sore. Modified to split squat with attempt to tap the large spiked half-ball, manual assistance to help guide foot back. x10 each side. Patient with lack of trunk control during exercise.  - modified plank windmills, in plank position toes to elbows on plinth that is braced against wall, rotating to side plank through front plank,  other side plank. 4x10 with tactile cuing, mirror feedback, and multimodal cuing to maintain trunk stability.  -Quadruped knee lift-offs from the ground 1-2 second holds x 5. Cuing for proper form.  - attempted quadruped crawl without knees touching but unable to complete without hips lifted up in compensatory manner.  - hip hinge drills at wall, gradually moving feet away from wall with cuing to touch wall with buttocks without leaning backwards. Practiced until reliably reproducing good form. - partial range dead lift with 5# dumbbells in each hand, at wall cuing to touch wall with buttocks without leaning backwards. x10 after several trials to get form correct.  - education about HEP and updates   HOME EXERCISE PROGRAM Access Code: [facility] URL: [email or web address]  Date: 04/08/2019  Prepared by: SRosita Kea  Exercises   Squat with Chair Touch - 10-15 reps - 1 second hold - 3 Sets - 1x daily - 7x weekly   Bird Dog - 10-15 reps - 1 second hold - 3 Sets - 1x daily - 7x weekly   Dead Bug with Swiss BEmma-  10-15 reps - 1 second hold - 3 Sets - 1x daily - 7x weekly   Side Plank on Knees - 10-15 reps - 1 second hold - 3 Sets - 1x daily - 3x weekly   Staggered Stance Single Arm Row with Anchored Resistance - 10-15 reps - 1 second hold - 3 Sets - 1x daily - 3x weekly   Seated Correct Posture   Walking - 30 min hold - 4x weekly   Standing Diagonal Chop - 3 sets - 10 reps - 1x daily   Reverse Chop with Resistance - 3 sets - 10 reps - 1x daily   Supine Bridge - 1-3 sets - 10 reps - 1-5 sec hold - 1x daily   Reverse Lunge - 1-3 sets - 10 reps   Push Up on Table - 1-3 sets - 5-10 reps - 1x daily      PT Education - 04/28/19 1132    Education Details  Exercise purpose/form. Self management techniques. Education on diagnosis, prognosis, POC, anatomy and physiology of current condition    Person(s) Educated  Patient    Methods  Explanation;Demonstration;Tactile  cues;Verbal cues    Comprehension  Verbalized understanding;Returned demonstration;Verbal cues required;Tactile cues required;Need further instruction       PT Short Term Goals - 04/23/19 1806      PT SHORT TERM GOAL #1   Title  Be independent with home exercise program completed at least 3 times per week for self-management of symptoms.    Baseline  Initial HEP provided at initial eval (11/04/2018);    Time  2    Period  Weeks    Status  Achieved    Target Date  11/19/18      PT SHORT TERM GOAL #2   Title  Reduce pain to 8/10 with functional activities to allow patient to complete valued functional tasks such as sit to stand with less difficulty.    Baseline  reports 10/10 at initial evaluation (11/04/2018); 9.5/10 (11/28/2018); 8/10 at night while trying to sleep (12/09/2018)    Time  2    Period  Weeks    Status  Achieved    Target Date  12/12/18      PT SHORT TERM GOAL #3   Title  Improve lumbar AROM to 75% to allow patient to complete valued activities with less difficulty.     Baseline  50% with end range pain at initial evaluaiton (11/04/2018);     Time  2    Period  Weeks    Status  Achieved    Target Date  11/19/18        PT Long Term Goals - 04/23/19 1806      PT LONG TERM GOAL #1   Title  Be independent with a long-term home exercise program for self-management of symptoms.     Baseline  Initial HEP provided at initial eval (11/04/2018); Initial HEP and progressions have been provided but not final progressions (11/28/2018); Initial HEP and progressions have been provided but not final progressions (12/09/2018; 01/06/2019; 02/26/2019; 04/23/2019)    Time  6    Period  Weeks    Status  Partially Met    Target Date  06/03/19      PT LONG TERM GOAL #2   Title  Demonstrate improved FOTO score by 10 units to demonstrate improvement in overall condition and self-reported functional ability.     Baseline  Foto = 47 (11/04/2018); too early to re-measure (11/28/2018); not  measured (  12/09/2018); 44 (01/06/2019, 02/26/2019 - questionnaire is only asking one question, no longer valid, see new goal).     Time  6    Period  Weeks    Status  Deferred    Target Date  03/03/19      PT LONG TERM GOAL #3   Title  Complete community, work and/or recreational activities without limitation due to current condition.     Baseline   limited in daily activities and responsibilities, limited to 5# of lifting, disrupts sleep. 5# weight lift limit (11/04/2018); pt reports increased activty since starting PT, continues to have similar limitations to baseline (11/28/2018);; patient reports increased activity since strating PT, continues to have similar limitations to baseline but with slightly lower pain (12/09/2018); continues to have similar limitations but with less pain overall (01/06/2019); feels similar limitations but more better days than bad days now (02/26/2019).; continues to have difficulty picking up son (04/23/2019);     Time  6    Period  Weeks    Status  Partially Met    Target Date  06/03/19      PT LONG TERM GOAL #4   Title  Patient will increase BLE gross strength to 4+/5 as to improve functional strength for independent gait, increased standing tolerance and increased ADL ability.    Baseline  Hip: Abduction: R = 3+/5 painful , L = 4+/5 painful!. Hip Flexion: R = 3+/5, L = 4/5. Knee Flex: R = 4/5, L = 5/5. (11/04/2018); see objective exam (01/06/2019);  see objective exam (02/26/2019); did not measure formally (04/23/2019)    Time  6    Period  Weeks    Status  Partially Met    Target Date  06/03/19      PT LONG TERM GOAL #5   Title  Be able to squat to chair height with proper form without limitation due to current condition in order to improve ability to lift and complete transfers during usual activities.     Baseline  Difficulty with sit<>stand transfers due to pain but less than at initial eval (11/28/2018); improved sit <> stand transfers with reduced pain compared  to initial eval (12/09/2018);; able to complete partial squat and improved sit to stand but continued pain (01/06/2019);  able to quat to chair height with mild discomfort (02/26/2019);     Time  6    Period  Weeks    Status  Achieved    Target Date  04/09/19      Additional Long Term Goals   Additional Long Term Goals  Yes      PT LONG TERM GOAL #6   Title  Demonstrate improved modified Oswestry Disability Index (mODI) score to equal or less than 20% to demonstrate improvement in overall condition and self-reported functional ability.     Baseline  50% (02/26/2019); 42% (04/23/2019);     Time  6    Period  Weeks    Status  Partially Met    Target Date  06/03/19      PT LONG TERM GOAL #7   Title  Patient will be able to complete 5 back lunges with knee to floor and light UE support with good form to improve her activity tolerance for activities requiring assymetrical loading such as picking up her son.     Baseline  Back lunge: excessive forward bend, unable to touch knee to ground, unsteady, requires unilateral UE support(04/23/2019);     Time  6    Period  Weeks    Status  New    Target Date  06/03/19            Plan - 04/28/19 1321    Clinical Impression Statement  Patient tolerated treatment well and is making progress towards goals at this point. with pain maintained at 3/10 for the first half of the session, then crept up to 4/10 without an identifiable cause. Patient motivated to continue exercising and especially liked windmill planks. Continues to show trunk weakness and incoordination that may be contributing to her pain, especially with more complex functional activities. She was somewhat limited today due to some remaining soreness in the quads, so exercises were adapted to her tolerance level. She continues to have deficits in activity tolerance, strength, pain, and range of motion that are limiting her ability to complete her usual ADLs, IADLs, child care tasks, and usual  social and community activities without difficulty. She will benefit from continued physical therapy to address remaining impairments and functional deficits and work towards stated goals.     Rehab Potential  Good    Clinical Impairments Affecting Rehab Potential  (+) denies leg symptoms; (-) apparent subjective pain exaggeration, external locus of control, focus on pain.     PT Frequency  2x / week    PT Duration  6 weeks    PT Treatment/Interventions  ADLs/Self Care Home Management;Moist Heat;Cryotherapy;Functional mobility training;Stair training;Therapeutic activities;Therapeutic exercise;Balance training;Neuromuscular re-education;Patient/family education;Manual techniques;Passive range of motion;Dry needling;Spinal Manipulations;Joint Manipulations;Other (comment)    PT Next Visit Plan  progress functional strengthening as tolerated/appropriate. Continue working on lunges, planks, push ups    PT [place]  Access Code: [email or web address]    Consulted and Agree with Plan of Care  Patient       Patient will benefit from skilled therapeutic intervention in order to improve the following deficits and impairments:  Decreased endurance, Decreased mobility, Difficulty walking, Hypomobility, Increased muscle spasms, Decreased range of motion, Impaired perceived functional ability, Impaired tone, Obesity, Decreased activity tolerance, Decreased strength, Postural dysfunction, Pain, Impaired flexibility  Visit Diagnosis: Chronic bilateral low back pain without sciatica  Difficulty in walking, not elsewhere classified     Problem List Patient Active Problem List   Diagnosis Date Noted  . Menorrhagia with regular cycle 03/11/2018  . Microcytic anemia 02/25/2018  . Iron deficiency anemia due to chronic blood loss 02/18/2018    Everlean Alstrom. Graylon Good, PT, DPT 04/28/19, 1:23 PM  [facility] [address] [place],  [address] Phone: [phone]   Fax:  [phone]  Name: Cathy Trevino MRN: [id number] Date of Birth: August 23, 1986

## 2019-04-30 ENCOUNTER — Encounter: Payer: Self-pay | Admitting: Physical Therapy

## 2019-04-30 ENCOUNTER — Other Ambulatory Visit: Payer: Self-pay

## 2019-04-30 ENCOUNTER — Ambulatory Visit: Payer: Medicaid Other | Admitting: Physical Therapy

## 2019-04-30 DIAGNOSIS — M545 Low back pain, unspecified: Secondary | ICD-10-CM

## 2019-04-30 DIAGNOSIS — R262 Difficulty in walking, not elsewhere classified: Secondary | ICD-10-CM

## 2019-04-30 DIAGNOSIS — G8929 Other chronic pain: Secondary | ICD-10-CM

## 2019-04-30 NOTE — Therapy (Signed)
[facility] [address] [place], [address] Phone: [phone]   Fax:  [phone]  Physical Therapy Treatment  Patient Details  Name: Cathy Trevino MRN: [id number] Date of Birth: Jan 22, 1986 Referring Provider (PT): Margurite Auerbach, NP-C   Encounter Date: 04/30/2019  PT End of Session - 04/30/19 6203    Visit Number  23    Number of Visits  32    Date for PT Re-Evaluation  06/03/19    Authorization Type  Medicaid reporting period from 04/23/2019; auth through 06/03/2019    Authorization Time Period  Current Certification period: 04/23/2019 - 5/59/7416 (last PN/Re-Cert 02/23/4535)    Authorization - Visit Number  3    Authorization - Number of Visits  10    PT Start Time  0930    PT Stop Time  1015    PT Time Calculation (min)  45 min    Activity Tolerance  Patient limited by pain;Patient limited by fatigue;Patient tolerated treatment well    Behavior During Therapy  [facility] for tasks assessed/performed       Past Medical History:  Diagnosis Date  . Anemia   . Anxiety   . Asthma   . Depression   . Menorrhagia     Past Surgical History:  Procedure Laterality Date  . CESAREAN SECTION  2012  . TUBAL LIGATION      There were no vitals filed for this visit.  Subjective Assessment - 04/30/19 0934    Subjective  Patient reports she awoke about 1.5 hours ago with increased pain up to 5/10 in the familiar R upper lumbar/lower thoracic region. She reports she felt good following last treatment session and yesterday, with some tolerable soreness in her R quad. States her HEP is going well and she has incorporated the plank windmills into her routine.     Pertinent History  Patient is a 33 y.o. female who presents to outpatient physical therapy with a referral for chronic low back pain. This patient's chief complaints consist of pain, leading to the following functional deficits: difficulty with all activities that  require movement or motion including lifting, bending, prolonged positions, sleeping. Relevant past medical history and comorbidities include "musculoskeletal issue around heart" told to take tylenol when needed (sees cardiologist when he feels is necessary, last was July 2018), obesity, previous episodes of low back pain, history of depression and anxiety, anemia. Imaging: From MRI report dated 07/15/2018: "1. 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 2. Diffusely decreased T1 signal throughout the visualized bone marrow, nonspecific, but suspected to be related to patient's history of anemia. 3. Otherwise normal MRI of the lumbar spine."    Limitations  Sitting;Walking;Lifting;House hold activities;Standing    How long can you sit comfortably?  < 10 minutes on bad day. 10 min on a good day.     How long can you stand comfortably?  "I hate that" "I just have to keep moving" "not sure if it is the pain or everything just keeps moving"     How long can you walk comfortably?  20-25 min.     Diagnostic tests  MRI report dated 07/15/2018: "1. 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 2. Diffusely decreased T1 signal throughout the visualized bone marrow, nonspecific, but suspected to be related to patient's  history of anemia. 3. Otherwise normal MRI of the lumbar spine."    Patient Stated Goals  Get rid of the pain so she can function better.     Pain Score  5     Pain Location  Back    Pain Orientation  Right;Mid    Pain Onset  More than a month ago       TREATMENT:  Denies latex allergy.  Therapeutic exercise:to centralize symptoms and improve ROM and strength required for successful completion of functional activities.  Treadmill levelup to2.35mh, 5%grade.For improved lower extremity  mobility, muscular endurance, and weightbearing activity tolerance; and to induce the analgesic effect of aerobic exercise, stimulate improved joint nutrition, and prepare body structures and systems for following interventions. x6Minutes during subjective exam. - attempted squats with scapular depression against theraband anchored overhead and held in both hands in an upside down V in front of body, driving hands towards knees to improve lat activation during squat and improve hip hinge. Attempted facing wall and away from wall. Excessive hinging at lumbar spine noted and lack of glute use. Theraband seemed to encourage excessive spinal rounding. Discontinued after several attempts including with video feedback.  - hip hinge practice with various external cuing set ups and video feedback between some attempts, rest breaks as needed. Completed at least 50 hip hinge/quats. Most successful cuing stick behind back with red theraband attached at distal end of stick pulling anteriorly to provide feedback at sacrum, chair positioned in front of pt to prevent excessive knee forward tranlation, patient holding other end of stick at neck. Cuing to push hips back into stick at sacrum while keeping stick in contact with thoracic spine and head. Patient struggled to perform hip hinge with many compensations, including knees forward, spinal flexion, incomplete hip extension evident. Improved with extensive cuing and practice. Spaced practice with two "breaks" doing other activities in between to improve ability to find correct form with spaced repetition.  - modified plank windmills, in plank position toes to elbows on plinth that is braced against wall, rotating to side plank through front plank, other side plank. X8, x7 with tactile cuing and multimodal cuing to maintain trunk stability. Improved form today and evidence of learning with improved self-awareness and self correction at times even without mirror feedback.   - back lunge over airex pad to knee touch airex pad with bilateral, then ipsilateral UE support using TRX. 2x5 sets in final form plus practice to achieve proper form and find proper distance using TRX. Pt demo improved trunk control compared to similar exercise completed next to support bar instead of TRX. Struggles much more with R foot back and notes anterior thigh pain. Lack of strong glute contraction suspected. Excessive forward trunk bend improved with practice, more pronounce with R foot back. Circumducts foot around airex when stepping R > L. Prefers to touch floor with top of foot when stepping back, able to improve to toes under on L but not R.  - Glute bridges with upper back/shoulders on low plinth, knees flexed to 90 degrees. To table top position. Attempted 2 reps with marching but unable to do well. Completed x10 reps with double leg stance and no marching. Challenging but able to perform.  - education about HEP ; discussed 10 reps of glute bridge every other day at home. Tech malfunction prevented adding to HEP print-out.    HOME EXERCISE PROGRAM [facility] URL: [email or web address]  Date: 04/08/2019  Prepared by: SRosita Kea  Exercises   Squat with Chair Touch - 10-15 reps - 1 second hold - 3 Sets - 1x daily - 7x weekly   Bird Dog - 10-15 reps - 1 second hold - 3 Sets - 1x daily - 7x weekly   Dead Bug with Swiss Ball - 10-15 reps - 1 second hold - 3 Sets - 1x daily - 7x weekly   Side Plank on Knees - 10-15 reps - 1 second hold - 3 Sets - 1x daily - 3x weekly   Staggered Stance Single Arm Row with Anchored Resistance - 10-15 reps - 1 second hold - 3 Sets - 1x daily - 3x weekly   Seated Correct Posture   Walking - 30 min hold - 4x weekly   Standing Diagonal Chop - 3 sets - 10 reps - 1x daily   Reverse Chop with Resistance - 3 sets - 10 reps - 1x daily   Supine Bridge - 1-3 sets - 10 reps - 1-5 sec hold - 1x daily   Reverse Lunge - 1-3  sets - 10 reps   Push Up on Table - 1-3 sets - 5-10 reps - 1x daily    PT Education - 04/30/19 0936    Education Details  Exercise purpose/form. Self management techniques. Education on diagnosis, prognosis, POC, anatomy and physiology of current condition    Person(s) Educated  Patient    Methods  Explanation;Demonstration;Tactile cues;Verbal cues    Comprehension  Returned demonstration;Verbal cues required;Tactile cues required;Verbalized understanding       PT Short Term Goals - 04/23/19 1806      PT SHORT TERM GOAL #1   Title  Be independent with home exercise program completed at least 3 times per week for self-management of symptoms.    Baseline  Initial HEP provided at initial eval (11/04/2018);    Time  2    Period  Weeks    Status  Achieved    Target Date  11/19/18      PT SHORT TERM GOAL #2   Title  Reduce pain to 8/10 with functional activities to allow patient to complete valued functional tasks such as sit to stand with less difficulty.    Baseline  reports 10/10 at initial evaluation (11/04/2018); 9.5/10 (11/28/2018); 8/10 at night while trying to sleep (12/09/2018)    Time  2    Period  Weeks    Status  Achieved    Target Date  12/12/18      PT SHORT TERM GOAL #3   Title  Improve lumbar AROM to 75% to allow patient to complete valued activities with less difficulty.     Baseline  50% with end range pain at initial evaluaiton (11/04/2018);     Time  2    Period  Weeks    Status  Achieved    Target Date  11/19/18        PT Long Term Goals - 04/23/19 1806      PT LONG TERM GOAL #1   Title  Be independent with a long-term home exercise program for self-management of symptoms.     Baseline  Initial HEP provided at initial eval (11/04/2018); Initial HEP and progressions have been provided but not final progressions (11/28/2018); Initial HEP and progressions have been provided but not final progressions (12/09/2018; 01/06/2019; 02/26/2019; 04/23/2019)    Time  6     Period  Weeks    Status  Partially Met    Target Date  06/03/19  PT LONG TERM GOAL #2   Title  Demonstrate improved FOTO score by 10 units to demonstrate improvement in overall condition and self-reported functional ability.     Baseline  Foto = 47 (11/04/2018); too early to re-measure (11/28/2018); not measured (12/09/2018); 44 (01/06/2019, 02/26/2019 - questionnaire is only asking one question, no longer valid, see new goal).     Time  6    Period  Weeks    Status  Deferred    Target Date  03/03/19      PT LONG TERM GOAL #3   Title  Complete community, work and/or recreational activities without limitation due to current condition.     Baseline   limited in daily activities and responsibilities, limited to 5# of lifting, disrupts sleep. 5# weight lift limit (11/04/2018); pt reports increased activty since starting PT, continues to have similar limitations to baseline (11/28/2018);; patient reports increased activity since strating PT, continues to have similar limitations to baseline but with slightly lower pain (12/09/2018); continues to have similar limitations but with less pain overall (01/06/2019); feels similar limitations but more better days than bad days now (02/26/2019).; continues to have difficulty picking up son (04/23/2019);     Time  6    Period  Weeks    Status  Partially Met    Target Date  06/03/19      PT LONG TERM GOAL #4   Title  Patient will increase BLE gross strength to 4+/5 as to improve functional strength for independent gait, increased standing tolerance and increased ADL ability.    Baseline  Hip: Abduction: R = 3+/5 painful , L = 4+/5 painful!. Hip Flexion: R = 3+/5, L = 4/5. Knee Flex: R = 4/5, L = 5/5. (11/04/2018); see objective exam (01/06/2019);  see objective exam (02/26/2019); did not measure formally (04/23/2019)    Time  6    Period  Weeks    Status  Partially Met    Target Date  06/03/19      PT LONG TERM GOAL #5   Title  Be able to squat to chair  height with proper form without limitation due to current condition in order to improve ability to lift and complete transfers during usual activities.     Baseline  Difficulty with sit<>stand transfers due to pain but less than at initial eval (11/28/2018); improved sit <> stand transfers with reduced pain compared to initial eval (12/09/2018);; able to complete partial squat and improved sit to stand but continued pain (01/06/2019);  able to quat to chair height with mild discomfort (02/26/2019);     Time  6    Period  Weeks    Status  Achieved    Target Date  04/09/19      Additional Long Term Goals   Additional Long Term Goals  Yes      PT LONG TERM GOAL #6   Title  Demonstrate improved modified Oswestry Disability Index (mODI) score to equal or less than 20% to demonstrate improvement in overall condition and self-reported functional ability.     Baseline  50% (02/26/2019); 42% (04/23/2019);     Time  6    Period  Weeks    Status  Partially Met    Target Date  06/03/19      PT LONG TERM GOAL #7   Title  Patient will be able to complete 5 back lunges with knee to floor and light UE support with good form to improve her activity tolerance for  activities requiring assymetrical loading such as picking up her son.     Baseline  Back lunge: excessive forward bend, unable to touch knee to ground, unsteady, requires unilateral UE support(04/23/2019);     Time  6    Period  Weeks    Status  New    Target Date  06/03/19            Plan - 04/30/19 1117    Clinical Impression Statement  Patient tolerated treatment well and is making progress towards goals at this point, performing progressively more difficult and complex activities. Her pain maintained at 4-5/10 for the first half of the session, then crept up to 5/10 in the R anterior thigh at the end. Patient motivated to continue exercising and especially liked windmill planks. Continues to show trunk weakness and incoordination with her hips  that may be contributing to her pain, especially with more complex functional activities. She was able to return to more quad dominant activities. Was hindered by some numb feeling at her R shoulder when her arm was overhead. R quad pain could potentially be referred from upper lumbar spine. Will continue to monitor. She continues to have deficits in activity tolerance, strength, pain, and range of motion that are limiting her ability to complete her usual ADLs, IADLs, child care tasks, and usual social and community activities without difficulty. She will benefit from continued physical therapy to address remaining impairments and functional deficits and work towards stated goals.     Rehab Potential  Good    Clinical Impairments Affecting Rehab Potential  (+) denies leg symptoms; (-) apparent subjective pain exaggeration, external locus of control, focus on pain.     PT Frequency  2x / week    PT Duration  6 weeks    PT Treatment/Interventions  ADLs/Self Care Home Management;Moist Heat;Cryotherapy;Functional mobility training;Stair training;Therapeutic activities;Therapeutic exercise;Balance training;Neuromuscular re-education;Patient/family education;Manual techniques;Passive range of motion;Dry needling;Spinal Manipulations;Joint Manipulations;Other (comment)    PT Next Visit Plan  progress functional strengthening as tolerated/appropriate. Continue working on lunges, planks, push ups    PT [place]  Access Code: [email or web address]    Consulted and Agree with Plan of Care  Patient       Patient will benefit from skilled therapeutic intervention in order to improve the following deficits and impairments:  Decreased endurance, Decreased mobility, Difficulty walking, Hypomobility, Increased muscle spasms, Decreased range of motion, Impaired perceived functional ability, Impaired tone, Obesity, Decreased activity tolerance, Decreased strength, Postural dysfunction, Pain, Impaired  flexibility  Visit Diagnosis: Chronic bilateral low back pain without sciatica  Difficulty in walking, not elsewhere classified     Problem List Patient Active Problem List   Diagnosis Date Noted  . Menorrhagia with regular cycle 03/11/2018  . Microcytic anemia 02/25/2018  . Iron deficiency anemia due to chronic blood loss 02/18/2018    Everlean Alstrom. Graylon Good, PT, DPT 04/30/19, 11:18 AM  [facility] [address] [place], [address] Phone: [phone]   Fax:  [phone]  Name: Cathy Trevino MRN: [id number] Date of Birth: 1986/05/15

## 2019-05-05 ENCOUNTER — Other Ambulatory Visit: Payer: Self-pay

## 2019-05-05 ENCOUNTER — Ambulatory Visit: Payer: Medicaid Other | Admitting: Physical Therapy

## 2019-05-05 ENCOUNTER — Encounter: Payer: Self-pay | Admitting: Physical Therapy

## 2019-05-05 DIAGNOSIS — G8929 Other chronic pain: Secondary | ICD-10-CM

## 2019-05-05 DIAGNOSIS — R262 Difficulty in walking, not elsewhere classified: Secondary | ICD-10-CM

## 2019-05-05 DIAGNOSIS — M545 Low back pain: Secondary | ICD-10-CM | POA: Diagnosis not present

## 2019-05-05 NOTE — Therapy (Signed)
[facility] [address] [place], [address] Phone: [phone]   Fax:  [phone]  Physical Therapy Treatment  Patient Details  Name: Cathy Trevino MRN: [id number] Date of Birth: Jun 02, 1986 Referring Provider (PT): Margurite Auerbach, NP-C   Encounter Date: 05/05/2019  PT End of Session - 05/05/19 1208    Visit Number  24    Number of Visits  32    Date for PT Re-Evaluation  06/03/19    Authorization Type  Medicaid reporting period from 04/23/2019; auth through 06/03/2019    Authorization Time Period  Current Certification period: 04/23/2019 - 0/94/0768 (last PN/Re-Cert 0/07/8109)    Authorization - Visit Number  4    Authorization - Number of Visits  10    PT Start Time  1035    PT Stop Time  1135    PT Time Calculation (min)  60 min    Activity Tolerance  Patient limited by pain;Patient limited by fatigue;Patient tolerated treatment well    Behavior During Therapy  [facility] for tasks assessed/performed       Past Medical History:  Diagnosis Date  . Anemia   . Anxiety   . Asthma   . Depression   . Menorrhagia     Past Surgical History:  Procedure Laterality Date  . CESAREAN SECTION  2012  . TUBAL LIGATION      There were no vitals filed for this visit.  Subjective Assessment - 05/05/19 1042    Subjective  Patient report she has 4/10 pain in the R upper lumbar and lower thoracic region. No thigh pain today. Reports she felt better within 1-2 hours after last treatment session and her thigh pain gradually went away. R arm only bothers her when it is overhead. HEP is going pretty good. She has been working     Pertinent History  Patient is a 33 y.o. female who presents to outpatient physical therapy with a referral for chronic low back pain. This patient's chief complaints consist of pain, leading to the following functional deficits: difficulty with all activities that require movement or motion including  lifting, bending, prolonged positions, sleeping. Relevant past medical history and comorbidities include "musculoskeletal issue around heart" told to take tylenol when needed (sees cardiologist when he feels is necessary, last was July 2018), obesity, previous episodes of low back pain, history of depression and anxiety, anemia. Imaging: From MRI report dated 07/15/2018: "1. 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 2. Diffusely decreased T1 signal throughout the visualized bone marrow, nonspecific, but suspected to be related to patient's history of anemia. 3. Otherwise normal MRI of the lumbar spine."    Limitations  Sitting;Walking;Lifting;House hold activities;Standing    How long can you sit comfortably?  < 10 minutes on bad day. 10 min on a good day.     How long can you stand comfortably?  "I hate that" "I just have to keep moving" "not sure if it is the pain or everything just keeps moving"     How long can you walk comfortably?  20-25 min.     Diagnostic tests  MRI report dated 07/15/2018: "1. 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 2. Diffusely decreased T1 signal throughout the visualized bone marrow, nonspecific, but suspected to be related to patient's history of  anemia. 3. Otherwise normal MRI of the lumbar spine."    Patient Stated Goals  Get rid of the pain so she can function better.     Currently in Pain?  Yes    Pain Score  4     Pain Location  Back    Pain Orientation  Right;Mid    Pain Descriptors / Indicators  Dull    Pain Type  Chronic pain    Pain Onset  More than a month ago      TREATMENT:  Denies latex allergy.  Therapeutic exercise:to centralize symptoms and improve ROM and strength required for successful completion of functional activities.  Treadmill levelup  to2.45mh, 5%grade.For improved lower extremity mobility, muscular endurance, and weightbearing activity tolerance; and to induce the analgesic effect of aerobic exercise, stimulate improved joint nutrition, and prepare body structures and systems for following interventions. x6Minutes during subjective exam. - Glute bridges with upper back/shoulders on low plinth, knees flexed to 90 degrees. To table top position. Completed 2x10 reps with double leg stance and no marching. Challenging but able to perform.  - back lunge over airex pad to knee touch airex pad with ipsilateral UE support using TRX. 2x10 sets each side. Pt demo improved trunk control compared to last week. Improved completion with R foot back and able to now perform both sides with toes extended. Comes to complete rest in kneeling prior to standing up. Required cues not to use contralateral UE push off from knee. Excessive forward trunk bend improved with practice. Struggles to maintain tension throughout body and stable core. To improve trunk, hip, LE, and postural control during asymetrical loading to strengthen patterns found challenging/painful in daily activities. - Kettle pass around at hips x 10 each way in tandem stance, circling towards front foot. To improve trunk stability during dynamic purturbation. Cuing for technique and trunk stability.  - SL Romanian Dead Lift (RDL) prep: standing in front of high plinth, sliding both hands forward on the plinth into shoulder flexion, while one hip extends standing on one leg as in a RDL. Cuing to keep flat back position and hinge at hip, using glute to initiate and perform movement. To improve trunk stability and strengthen glute/hamstrings while improving coordination between trunk and LE for decreased pain during functional activity. 2x10 each side - hip hinge practice with various external cuing set ups: x20 with stick behind back with red theraband attached at distal end of stick  pulling anteriorly to provide feedback at sacrum, chair positioned in front of pt to prevent excessive knee forward tranlation, patient holding other end of stick at neck. Cuing to push hips back into stick at sacrum while keeping stick in contact with thoracic spine and head. x10 with 20# kettlebell held over sacrum with BUE to help maintain retracted scapulae and upright posture. Patient struggled to perform hip hinge with many compensations, including knees forward, spinal flexion, incomplete hip extension evident. Continued improvement with extensive cuing and practice. Arching into lumbar lordosis more prevalent today. When left to herself continues to perform more of a squat than hip hinge.  - modified plank windmills, in plank position toes to elbows on plinth that is braced against wall, rotating to side plank through front plank, other side plank. 2x10 with tactile cuing and multimodal cuing and mirror feedback to maintain trunk stability.More difficulty maintaining posture when fatigued.  - reviewed shoulder elevated glute bridge exercise for HEP   HOME EXERCISE PROGRAM [facility]: [email or web address] URL: [email or web address]  Date:  04/08/2019  Prepared by: Rosita Kea   Exercises   Squat with Chair Touch - 10-15 reps - 1 second hold - 3 Sets - 1x daily - 7x weekly   Bird Dog - 10-15 reps - 1 second hold - 3 Sets - 1x daily - 7x weekly   Dead Bug with Swiss Ball - 10-15 reps - 1 second hold - 3 Sets - 1x daily - 7x weekly   Side Plank on Knees - 10-15 reps - 1 second hold - 3 Sets - 1x daily - 3x weekly   Staggered Stance Single Arm Row with Anchored Resistance - 10-15 reps - 1 second hold - 3 Sets - 1x daily - 3x weekly   Seated Correct Posture   Walking - 30 min hold - 4x weekly   Standing Diagonal Chop - 3 sets - 10 reps - 1x daily   Reverse Chop with Resistance - 3 sets - 10 reps - 1x daily   Supine Bridge - 1-3 sets - 10 reps - 1-5 sec hold - 1x daily    Reverse Lunge - 1-3 sets - 10 reps   Push Up on Table - 1-3 sets - 5-10 reps - 1x daily     PT Education - 05/05/19 1208    Education Details  Exercise purpose/form. Self management techniques. Education on diagnosis, prognosis, POC, anatomy and physiology of current condition    Person(s) Educated  Patient    Methods  Explanation;Demonstration;Tactile cues;Verbal cues    Comprehension  Verbalized understanding;Returned demonstration;Verbal cues required;Tactile cues required;Need further instruction       PT Short Term Goals - 04/23/19 1806      PT SHORT TERM GOAL #1   Title  Be independent with home exercise program completed at least 3 times per week for self-management of symptoms.    Baseline  Initial HEP provided at initial eval (11/04/2018);    Time  2    Period  Weeks    Status  Achieved    Target Date  11/19/18      PT SHORT TERM GOAL #2   Title  Reduce pain to 8/10 with functional activities to allow patient to complete valued functional tasks such as sit to stand with less difficulty.    Baseline  reports 10/10 at initial evaluation (11/04/2018); 9.5/10 (11/28/2018); 8/10 at night while trying to sleep (12/09/2018)    Time  2    Period  Weeks    Status  Achieved    Target Date  12/12/18      PT SHORT TERM GOAL #3   Title  Improve lumbar AROM to 75% to allow patient to complete valued activities with less difficulty.     Baseline  50% with end range pain at initial evaluaiton (11/04/2018);     Time  2    Period  Weeks    Status  Achieved    Target Date  11/19/18        PT Long Term Goals - 04/23/19 1806      PT LONG TERM GOAL #1   Title  Be independent with a long-term home exercise program for self-management of symptoms.     Baseline  Initial HEP provided at initial eval (11/04/2018); Initial HEP and progressions have been provided but not final progressions (11/28/2018); Initial HEP and progressions have been provided but not final progressions  (12/09/2018; 01/06/2019; 02/26/2019; 04/23/2019)    Time  6    Period  Weeks    Status  Partially Met    Target Date  06/03/19      PT LONG TERM GOAL #2   Title  Demonstrate improved FOTO score by 10 units to demonstrate improvement in overall condition and self-reported functional ability.     Baseline  Foto = 47 (11/04/2018); too early to re-measure (11/28/2018); not measured (12/09/2018); 44 (01/06/2019, 02/26/2019 - questionnaire is only asking one question, no longer valid, see new goal).     Time  6    Period  Weeks    Status  Deferred    Target Date  03/03/19      PT LONG TERM GOAL #3   Title  Complete community, work and/or recreational activities without limitation due to current condition.     Baseline   limited in daily activities and responsibilities, limited to 5# of lifting, disrupts sleep. 5# weight lift limit (11/04/2018); pt reports increased activty since starting PT, continues to have similar limitations to baseline (11/28/2018);; patient reports increased activity since strating PT, continues to have similar limitations to baseline but with slightly lower pain (12/09/2018); continues to have similar limitations but with less pain overall (01/06/2019); feels similar limitations but more better days than bad days now (02/26/2019).; continues to have difficulty picking up son (04/23/2019);     Time  6    Period  Weeks    Status  Partially Met    Target Date  06/03/19      PT LONG TERM GOAL #4   Title  Patient will increase BLE gross strength to 4+/5 as to improve functional strength for independent gait, increased standing tolerance and increased ADL ability.    Baseline  Hip: Abduction: R = 3+/5 painful , L = 4+/5 painful!. Hip Flexion: R = 3+/5, L = 4/5. Knee Flex: R = 4/5, L = 5/5. (11/04/2018); see objective exam (01/06/2019);  see objective exam (02/26/2019); did not measure formally (04/23/2019)    Time  6    Period  Weeks    Status  Partially Met    Target Date  06/03/19       PT LONG TERM GOAL #5   Title  Be able to squat to chair height with proper form without limitation due to current condition in order to improve ability to lift and complete transfers during usual activities.     Baseline  Difficulty with sit<>stand transfers due to pain but less than at initial eval (11/28/2018); improved sit <> stand transfers with reduced pain compared to initial eval (12/09/2018);; able to complete partial squat and improved sit to stand but continued pain (01/06/2019);  able to quat to chair height with mild discomfort (02/26/2019);     Time  6    Period  Weeks    Status  Achieved    Target Date  04/09/19      Additional Long Term Goals   Additional Long Term Goals  Yes      PT LONG TERM GOAL #6   Title  Demonstrate improved modified Oswestry Disability Index (mODI) score to equal or less than 20% to demonstrate improvement in overall condition and self-reported functional ability.     Baseline  50% (02/26/2019); 42% (04/23/2019);     Time  6    Period  Weeks    Status  Partially Met    Target Date  06/03/19      PT LONG TERM GOAL #7   Title  Patient will be able to complete 5 back lunges with knee to  floor and light UE support with good form to improve her activity tolerance for activities requiring assymetrical loading such as picking up her son.     Baseline  Back lunge: excessive forward bend, unable to touch knee to ground, unsteady, requires unilateral UE support(04/23/2019);     Time  6    Period  Weeks    Status  New    Target Date  06/03/19            Plan - 05/05/19 1211    Clinical Impression Statement  Patient tolerated treatment well and is continuing to make progress towards goals at this point. Continued to work on complex funcitonal exercises requiring core, LE, and postural stability and strength with more reps than last session with no increase in back pain or production of thigh pain. Patient continues to complain of R shoulder numbness/pain that  worsens with overhead movement. Will continue to monitor. 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 It is limiting her participation in exercises for her back and may require further attention. Resolves by end of session with rest.  Patient continues to give good effort and shows signs of improved coordination and strength but, continues to show trunk weakness and incoordination with her hips that may be contributing to her pain, especially with more complex functional activities. She continues to have deficits in activity tolerance, strength, pain, and range of motion that are limiting her ability to complete her usual ADLs, IADLs, child care tasks, and usual social and community activities without difficulty. She will benefit from continued physical therapy to address remaining impairments and functional deficits and work towards stated goals.     Rehab Potential  Good    Clinical Impairments Affecting Rehab Potential  (+) denies leg symptoms; (-) apparent subjective pain exaggeration, external locus of control, focus on pain.     PT Frequency  2x / week    PT Duration  6 weeks    PT Treatment/Interventions  ADLs/Self Care Home Management;Moist Heat;Cryotherapy;Functional mobility training;Stair training;Therapeutic activities;Therapeutic exercise;Balance training;Neuromuscular re-education;Patient/family education;Manual techniques;Passive range of motion;Dry needling;Spinal Manipulations;Joint Manipulations;Other (comment)    PT Next Visit Plan  progress functional strengthening as tolerated/appropriate. Continue working on lunges, planks, push ups    PT [place]  Access Code: [email or web address]    Consulted and Agree with Plan of Care  Patient       Patient will benefit from skilled therapeutic intervention in order to improve the following deficits and impairments:  Decreased endurance, Decreased mobility, Difficulty walking, Hypomobility, Increased muscle  spasms, Decreased range of motion, Impaired perceived functional ability, Impaired tone, Obesity, Decreased activity tolerance, Decreased strength, Postural dysfunction, Pain, Impaired flexibility  Visit Diagnosis: Chronic bilateral low back pain without sciatica  Difficulty in walking, not elsewhere classified     Problem List Patient Active Problem List   Diagnosis Date Noted  . Menorrhagia with regular cycle 03/11/2018  . Microcytic anemia 02/25/2018  . Iron deficiency anemia due to chronic blood loss 02/18/2018    Everlean Alstrom. Graylon Good, PT, DPT 05/05/19, 12:12 PM  [facility] [address], [place], [address] Phone: [phone]   Fax:  [phone]  Name: Cathy Trevino MRN: [id number] Date of Birth: [birth year]

## 2019-05-07 ENCOUNTER — Other Ambulatory Visit: Payer: Self-pay

## 2019-05-07 ENCOUNTER — Ambulatory Visit: Payer: Medicaid Other | Admitting: Physical Therapy

## 2019-05-07 ENCOUNTER — Encounter: Payer: Self-pay | Admitting: Physical Therapy

## 2019-05-07 DIAGNOSIS — G8929 Other chronic pain: Secondary | ICD-10-CM

## 2019-05-07 DIAGNOSIS — M545 Low back pain: Secondary | ICD-10-CM | POA: Diagnosis not present

## 2019-05-07 DIAGNOSIS — R262 Difficulty in walking, not elsewhere classified: Secondary | ICD-10-CM

## 2019-05-07 NOTE — Therapy (Signed)
[facility] [address] [place], [address] Phone: [phone]   Fax:  [phone]  Physical Therapy Treatment  Patient Details  Name: TEAGEN BUCIO MRN: [id number] Date of Birth: 07-17-1986 Referring Provider (PT): Margurite Auerbach, NP-C   Encounter Date: 05/07/2019  PT End of Session - 05/07/19 0931    Visit Number  25    Number of Visits  32    Date for PT Re-Evaluation  06/03/19    Authorization Type  Medicaid reporting period from 04/23/2019; auth through 06/03/2019    Authorization Time Period  Current Certification period: 04/23/2019 - 7/86/7544 (last PN/Re-Cert 08/19/99)    Authorization - Visit Number  5    Authorization - Number of Visits  10    PT Start Time  0927    PT Stop Time  1015    PT Time Calculation (min)  48 min    Activity Tolerance  Patient limited by pain;Patient limited by fatigue;Patient tolerated treatment well    Behavior During Therapy  [facility] for tasks assessed/performed       Past Medical History:  Diagnosis Date  . Anemia   . Anxiety   . Asthma   . Depression   . Menorrhagia     Past Surgical History:  Procedure Laterality Date  . CESAREAN SECTION  2012  . TUBAL LIGATION      There were no vitals filed for this visit.  Subjective Assessment - 05/07/19 0929    Subjective  Patient reports she continues to have 4/10 pain in R upper lumbar/lower thoracic region. Her pain went up to 5/10 for about 1.5 hours after last treatment session, but no excessive soreness or pain. No pain/soreness anywhere else in the body. R arm has been alright as well. HEP going well, no real issues.     Pertinent History  Patient is a 33 y.o. female who presents to outpatient physical therapy with a referral for chronic low back pain. This patient's chief complaints consist of pain, leading to the following functional deficits: difficulty with all activities that require movement or motion including  lifting, bending, prolonged positions, sleeping. Relevant past medical history and comorbidities include "musculoskeletal issue around heart" told to take tylenol when needed (sees cardiologist when he feels is necessary, last was July 2018), obesity, previous episodes of low back pain, history of depression and anxiety, anemia. Imaging: From MRI report dated 07/15/2018: "1. 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 2. Diffusely decreased T1 signal throughout the visualized bone marrow, nonspecific, but suspected to be related to patient's history of anemia. 3. Otherwise normal MRI of the lumbar spine."    Limitations  Sitting;Walking;Lifting;House hold activities;Standing    How long can you sit comfortably?  < 10 minutes on bad day. 10 min on a good day.     How long can you stand comfortably?  "I hate that" "I just have to keep moving" "not sure if it is the pain or everything just keeps moving"     How long can you walk comfortably?  20-25 min.     Diagnostic tests  MRI report dated 07/15/2018: "1. 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 2. Diffusely decreased T1 signal throughout the visualized bone marrow, nonspecific, but suspected to be related to patient's history of anemia.  3. Otherwise normal MRI of the lumbar spine."    Patient Stated Goals  Get rid of the pain so she can function better.     Currently in Pain?  Yes    Pain Score  4     Pain Location  Back    Pain Orientation  Right;Mid    Pain Descriptors / Indicators  Dull    Pain Type  Chronic pain    Pain Onset  More than a month ago       TREATMENT:  Denies latex allergy.  Therapeutic exercise:to centralize symptoms and improve ROM and strength required for successful completion of functional activities.  Treadmill levelup  to2.51mh, 5%grade.For improved lower extremity mobility, muscular endurance, and weightbearing activity tolerance; and to induce the analgesic effect of aerobic exercise, stimulate improved joint nutrition, and prepare body structures and systems for following interventions. x5Minutes during subjective exam. - Glute bridges with upper back/shoulders on low plinth, knees flexed to 90 degrees. To table top position. Completed x10 reps with double leg stance. Repeated with double legs up, single leg down x 10 each side. Great difficulty lifting left leg due to R back pain.  - repeated cervical retraction in sitting without back support with self overpressure to start assessing and addressing R UE pain/parestheia that continues to limit participation in exercises for lumbar pain. X 15 or so to learn technique including with manual overpressure. Then x 10 well executed repetitions. No shoulder pain prior or during. Repeated one more set of 10 with good carry over. - prone modified plank over swiss ball (near hips) pulling 2# dumbbell back and forth under trunk alternating hands. 2x10 each side. To improve core strength and stability. Patient demo sagging in the lower spine but was focused on keeping balance on ball during activity, so no additional verbal cues were added this session. External cuing used to allow patient to problem solve motor control challenge. Appropriately fatigued each set.  - supine bridge with legs on swiss ball x 10 to improve core and back strength. Using hands on floor to stabilize.  - supine bridge with legs on swiss ball, to roll ball in cephalic direction and back before letting hips down. Patient very motivated to complete but found very challenging due to needing to apply max trunk and hip strength to control ball. 2x10 with additional failed reps throughout each set. To improve trunk and hip strength. - attempted back lunge with no UE support. Able to descend with good control  but unable to stand up without support. Discontinued for today.  - back lungeover airex pad to knee touch airex pad withipsilateral UE support using TRX.x10 sets each side. Pt demo improved trunk control compared to last week. Now able to complete with toes dorsiflexed with smoother form. Comes to complete rest in kneeling prior to standing up.Excessive forward trunk bend improved with practice. Struggles to maintain tension throughout body and stable core. To improve trunk, hip, LE, and postural control during asymetrical loading to strengthen patterns found challenging/painful in daily activities.  - Education on HEP including handout   HOME EXERCISE PROGRAM Access Code: [email or web address] URL: [email or web address]  Date: 05/07/2019  Prepared by: SRosita Kea  Exercises  Cervical Retraction with Overpressure - 10-15 reps - 1 second hold - 4x daily  Supine Hamstring Curl on Swiss Ball - 1-3 sets - 10 reps  Plank with Thighs on [employer]- 1-3 sets - 10 reps  Supine Hip Extension on Bench - 1-3  sets - 10-15 reps - 1 second hold   Access Code: [email or web address]  URL: [email or web address]  Date: 04/08/2019  Prepared by: Rosita Kea   Exercises   Squat with Chair Touch - 10-15 reps - 1 second hold - 3 Sets - 1x daily - 7x weekly   Bird Dog - 10-15 reps - 1 second hold - 3 Sets - 1x daily - 7x weekly   Dead Bug with Swiss Ball - 10-15 reps - 1 second hold - 3 Sets - 1x daily - 7x weekly   Side Plank on Knees - 10-15 reps - 1 second hold - 3 Sets - 1x daily - 3x weekly   Staggered Stance Single Arm Row with Anchored Resistance - 10-15 reps - 1 second hold - 3 Sets - 1x daily - 3x weekly   Seated Correct Posture   Walking - 30 min hold - 4x weekly   Standing Diagonal Chop - 3 sets - 10 reps - 1x daily   Reverse Chop with Resistance - 3 sets - 10 reps - 1x daily   Supine Bridge - 1-3 sets - 10 reps - 1-5 sec hold - 1x daily   Reverse Lunge - 1-3 sets - 10 reps    Push Up on Table - 1-3 sets - 5-10 reps - 1x daily    PT Education - 05/07/19 0930    Education Details  Exercise purpose/form. Self management techniques. Education on diagnosis, prognosis, POC, anatomy and physiology of current condition    Person(s) Educated  Patient    Methods  Explanation;Demonstration;Tactile cues;Verbal cues    Comprehension  Returned demonstration;Verbalized understanding;Verbal cues required;Tactile cues required;Need further instruction       PT Short Term Goals - 04/23/19 1806      PT SHORT TERM GOAL #1   Title  Be independent with home exercise program completed at least 3 times per week for self-management of symptoms.    Baseline  Initial HEP provided at initial eval (11/04/2018);    Time  2    Period  Weeks    Status  Achieved    Target Date  11/19/18      PT SHORT TERM GOAL #2   Title  Reduce pain to 8/10 with functional activities to allow patient to complete valued functional tasks such as sit to stand with less difficulty.    Baseline  reports 10/10 at initial evaluation (11/04/2018); 9.5/10 (11/28/2018); 8/10 at night while trying to sleep (12/09/2018)    Time  2    Period  Weeks    Status  Achieved    Target Date  12/12/18      PT SHORT TERM GOAL #3   Title  Improve lumbar AROM to 75% to allow patient to complete valued activities with less difficulty.     Baseline  50% with end range pain at initial evaluaiton (11/04/2018);     Time  2    Period  Weeks    Status  Achieved    Target Date  11/19/18        PT Long Term Goals - 04/23/19 1806      PT LONG TERM GOAL #1   Title  Be independent with a long-term home exercise program for self-management of symptoms.     Baseline  Initial HEP provided at initial eval (11/04/2018); Initial HEP and progressions have been provided but not final progressions (11/28/2018); Initial HEP and progressions have been provided but not final progressions (12/09/2018; 01/06/2019; 02/26/2019; 04/23/2019)  Time  6    Period  Weeks    Status  Partially Met    Target Date  06/03/19      PT LONG TERM GOAL #2   Title  Demonstrate improved FOTO score by 10 units to demonstrate improvement in overall condition and self-reported functional ability.     Baseline  Foto = 47 (11/04/2018); too early to re-measure (11/28/2018); not measured (12/09/2018); 44 (01/06/2019, 02/26/2019 - questionnaire is only asking one question, no longer valid, see new goal).     Time  6    Period  Weeks    Status  Deferred    Target Date  03/03/19      PT LONG TERM GOAL #3   Title  Complete community, work and/or recreational activities without limitation due to current condition.     Baseline   limited in daily activities and responsibilities, limited to 5# of lifting, disrupts sleep. 5# weight lift limit (11/04/2018); pt reports increased activty since starting PT, continues to have similar limitations to baseline (11/28/2018);; patient reports increased activity since strating PT, continues to have similar limitations to baseline but with slightly lower pain (12/09/2018); continues to have similar limitations but with less pain overall (01/06/2019); feels similar limitations but more better days than bad days now (02/26/2019).; continues to have difficulty picking up son (04/23/2019);     Time  6    Period  Weeks    Status  Partially Met    Target Date  06/03/19      PT LONG TERM GOAL #4   Title  Patient will increase BLE gross strength to 4+/5 as to improve functional strength for independent gait, increased standing tolerance and increased ADL ability.    Baseline  Hip: Abduction: R = 3+/5 painful , L = 4+/5 painful!. Hip Flexion: R = 3+/5, L = 4/5. Knee Flex: R = 4/5, L = 5/5. (11/04/2018); see objective exam (01/06/2019);  see objective exam (02/26/2019); did not measure formally (04/23/2019)    Time  6    Period  Weeks    Status  Partially Met    Target Date  06/03/19      PT LONG TERM GOAL #5   Title  Be able to  squat to chair height with proper form without limitation due to current condition in order to improve ability to lift and complete transfers during usual activities.     Baseline  Difficulty with sit<>stand transfers due to pain but less than at initial eval (11/28/2018); improved sit <> stand transfers with reduced pain compared to initial eval (12/09/2018);; able to complete partial squat and improved sit to stand but continued pain (01/06/2019);  able to quat to chair height with mild discomfort (02/26/2019);     Time  6    Period  Weeks    Status  Achieved    Target Date  04/09/19      Additional Long Term Goals   Additional Long Term Goals  Yes      PT LONG TERM GOAL #6   Title  Demonstrate improved modified Oswestry Disability Index (mODI) score to equal or less than 20% to demonstrate improvement in overall condition and self-reported functional ability.     Baseline  50% (02/26/2019); 42% (04/23/2019);     Time  6    Period  Weeks    Status  Partially Met    Target Date  06/03/19      PT LONG TERM GOAL #7  Title  Patient will be able to complete 5 back lunges with knee to floor and light UE support with good form to improve her activity tolerance for activities requiring assymetrical loading such as picking up her son.     Baseline  Back lunge: excessive forward bend, unable to touch knee to ground, unsteady, requires unilateral UE support(04/23/2019);     Time  6    Period  Weeks    Status  New    Target Date  06/03/19            Plan - 05/07/19 1740    Clinical Impression Statement   Patient tolerated treatment well and is continuing to make progress towards goals at this point. She is showing improvements in lunge control and strength overall but is still unable to perform without support. Able to progress to single leg lowers with shoulders elevated bridge, but found R single leg control quite painful and weak during attempt to lift left LE. Pain abated with rest. Overall  patient demonstrates poor glute strength, R > L that may be contributing to her back pain. Continued to work on complex funcitonal exercises requiring core, LE, and postural stability and strength with demonstration of improved control and strength over time. Cervical retraction was introduced to attempt to address the R UE discomfort that has started to affect pt's ability to participate in exercises for the low back. She was much less bothered by this discomfort today, suggesting retractions may be helping.  She will benefit from continued physical therapy to address remaining impairments and functional deficits and work towards stated goals.     Rehab Potential  Good    Clinical Impairments Affecting Rehab Potential  (+) denies leg symptoms; (-) apparent subjective pain exaggeration, external locus of control, focus on pain.     PT Frequency  2x / week    PT Duration  6 weeks    PT Treatment/Interventions  ADLs/Self Care Home Management;Moist Heat;Cryotherapy;Functional mobility training;Stair training;Therapeutic activities;Therapeutic exercise;Balance training;Neuromuscular re-education;Patient/family education;Manual techniques;Passive range of motion;Dry needling;Spinal Manipulations;Joint Manipulations;Other (comment)    PT Next Visit Plan  progress functional strengthening as tolerated/appropriate. Continue working on lunges, planks, push ups    PT [place]  Access Code: [facility] and Access Code: [email or web address]     Consulted and Agree with Plan of Care  Patient       Patient will benefit from skilled therapeutic intervention in order to improve the following deficits and impairments:  Decreased endurance, Decreased mobility, Difficulty walking, Hypomobility, Increased muscle spasms, Decreased range of motion, Impaired perceived functional ability, Impaired tone, Obesity, Decreased activity tolerance, Decreased strength, Postural dysfunction, Pain, Impaired flexibility  Visit  Diagnosis: Chronic bilateral low back pain without sciatica  Difficulty in walking, not elsewhere classified     Problem List Patient Active Problem List   Diagnosis Date Noted  . Menorrhagia with regular cycle 03/11/2018  . Microcytic anemia 02/25/2018  . Iron deficiency anemia due to chronic blood loss 02/18/2018    Everlean Alstrom. Graylon Good, PT, DPT 05/07/19, 5:40 PM  [place] [facility] [address] [place], [address] Phone: [phone]   Fax:  [phone]  Name: ARLANA CANIZALES MRN: [id number] Date of Birth: Mar 29, 1986

## 2019-05-14 ENCOUNTER — Ambulatory Visit: Payer: Medicaid Other | Admitting: Physical Therapy

## 2019-05-14 ENCOUNTER — Other Ambulatory Visit: Payer: Self-pay

## 2019-05-14 ENCOUNTER — Encounter: Payer: Self-pay | Admitting: Physical Therapy

## 2019-05-14 DIAGNOSIS — R262 Difficulty in walking, not elsewhere classified: Secondary | ICD-10-CM

## 2019-05-14 DIAGNOSIS — M545 Low back pain, unspecified: Secondary | ICD-10-CM

## 2019-05-14 DIAGNOSIS — G8929 Other chronic pain: Secondary | ICD-10-CM

## 2019-05-14 NOTE — Therapy (Signed)
[facility] [address] [place], [address] Phone: [phone]   Fax:  [phone]  Physical Therapy Treatment  Patient Details  Name: Cathy Trevino MRN: [id number] Date of Birth: [birth year] Referring Provider (PT): Margurite Auerbach, NP-C   Encounter Date: 05/14/2019  PT End of Session - 05/14/19 1337    Visit Number  26    Number of Visits  32    Date for PT Re-Evaluation  06/03/19    Authorization Type  Medicaid reporting period from 04/23/2019; auth through 06/03/2019    Authorization Time Period  Current Certification period: 04/23/2019 - 12/18/7508 (last PN/Re-Cert 01/22/8526)    Authorization - Visit Number  6    Authorization - Number of Visits  10    PT Start Time  1333    PT Stop Time  1415    PT Time Calculation (min)  42 min    Activity Tolerance  Patient limited by pain;Patient limited by fatigue;Patient tolerated treatment well    Behavior During Therapy  [facility] for tasks assessed/performed       Past Medical History:  Diagnosis Date  . Anemia   . Anxiety   . Asthma   . Depression   . Menorrhagia     Past Surgical History:  Procedure Laterality Date  . CESAREAN SECTION  2012  . TUBAL LIGATION      There were no vitals filed for this visit.  Subjective Assessment - 05/14/19 1335    Subjective  Patient states she is doing well today. She reports 3/10 pain in her familiar R upper lumbar/lower thoracic spine and her R arm has been bothering her off and on, no better or worse than usual. Her exercises are going well. She is completing her neck exercise. She reports she had no excessive pain or soreness following last treatment session.     Pertinent History  Patient is a 33 y.o. female who presents to outpatient physical therapy with a referral for chronic low back pain. This patient's chief complaints consist of pain, leading to the following functional deficits: difficulty with all activities  that require movement or motion including lifting, bending, prolonged positions, sleeping. Relevant past medical history and comorbidities include "musculoskeletal issue around heart" told to take tylenol when needed (sees cardiologist when he feels is necessary, last was July 2018), obesity, previous episodes of low back pain, history of depression and anxiety, anemia. Imaging: From MRI report dated 07/15/2018: "1. 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 2. Diffusely decreased T1 signal throughout the visualized bone marrow, nonspecific, but suspected to be related to patient's history of anemia. 3. Otherwise normal MRI of the lumbar spine."    Limitations  Sitting;Walking;Lifting;House hold activities;Standing    How long can you sit comfortably?  < 10 minutes on bad day. 10 min on a good day.     How long can you stand comfortably?  "I hate that" "I just have to keep moving" "not sure if it is the pain or everything just keeps moving"     How long can you walk comfortably?  20-25 min.     Diagnostic tests  MRI report dated 07/15/2018: "1. 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 2. Diffusely decreased T1 signal throughout the visualized bone marrow, nonspecific, but suspected to be  related to patient's history of anemia. 3. Otherwise normal MRI of the lumbar spine."    Patient Stated Goals  Get rid of the pain so she can function better.     Currently in Pain?  Yes    Pain Score  3     Pain Location  Back    Pain Orientation  Right;Mid    Pain Descriptors / Indicators  Dull    Pain Type  Chronic pain    Pain Onset  More than a month ago      TREATMENT:  Denies latex allergy.  Therapeutic exercise:to centralize symptoms and improve ROM and strength required for successful completion of  functional activities.  Treadmill levelup to2.80mh, 5%grade.For improved lower extremity mobility, muscular endurance, and weightbearing activity tolerance; and to induce the analgesic effect of aerobic exercise, stimulate improved joint nutrition, and prepare body structures and systems for following interventions. x5Minutes during subjective exam. - Glute bridges with upper back/shoulders on low plinth, knees flexed to 90 degrees. To table top position. Completedx10 reps with double leg stance. Repeated with double legs up, single leg down x 10 each side.More difficulty lifting left leg due to R back pain that resolves with rest. Able to continue controlling motion during pain when cued. Able to complete all reps today.  - repeated cervical retraction in sitting without back support with self overpressure to start assessing and addressing R UE pain/parestheia that continues to limit participation in exercises for lumbar pain.  x 10 well executed repetitions. X 10 with extension. No shoulder pain prior or during.  - prone modified plank over swiss ball (near hips) pulling 2# dumbbell back and forth under trunk alternating hands. 3x5 each side with ball moved closer to thighs, with intensive cuing and work at decreasing lumbar lordosis. Mirror and video feedback provided. Patient improving awareness but fatigues quickly To improve core strength and stability. External cuing used to allow patient to problem solve motor control challenge. Appropriately fatigued each set.  - supine bridge with legs on swiss ball, to roll ball in cephalic direction and back before letting hips down. Patient very motivated to complete but found very challenging due to needing to apply max trunk and hip strength to control ball. 2x10 with additional failed reps throughout each set. To improve trunk and hip strength. - back lungeover airex pad to knee touch airex pad withipsilateral UE support using TRX.x10sets each  side.Pt demo improved trunk control compared tolast week. Now able to complete with toes dorsiflexed with smoother form. Comes to complete rest in kneeling prior to standing up.Excessive forward trunk bend improved with practice. Struggles to maintain tension throughout body and stable core. To improve trunk, hip, LE, and postural control during asymetrical loading to strengthen patterns found challenging/painful in daily activities.   HOME EXERCISE PROGRAM Access Code: [email or web address] URL: [email or web address]  Date: 05/07/2019  Prepared by: SRosita Kea  Exercises   Cervical Retraction with Overpressure - 10-15 reps - 1 second hold - 4x daily   Supine Hamstring Curl on Swiss Ball - 1-3 sets - 10 reps   Plank with Thighs on [employer]- 1-3 sets - 10 reps   Supine Hip Extension on Bench - 1-3 sets - 10-15 reps - 1 second hold   Access Code: [email or web address]  URL: [email or web address]  Date: 04/08/2019  Prepared by: SRosita Kea  Exercises   Squat with Chair Touch - 10-15 reps - 1 second hold - 3 Sets - 1x daily - 7x  weekly   Bird Dog - 10-15 reps - 1 second hold - 3 Sets - 1x daily - 7x weekly   Dead Bug with Swiss Ball - 10-15 reps - 1 second hold - 3 Sets - 1x daily - 7x weekly   Side Plank on Knees - 10-15 reps - 1 second hold - 3 Sets - 1x daily - 3x weekly   Staggered Stance Single Arm Row with Anchored Resistance - 10-15 reps - 1 second hold - 3 Sets - 1x daily - 3x weekly   Seated Correct Posture   Walking - 30 min hold - 4x weekly   Standing Diagonal Chop - 3 sets - 10 reps - 1x daily   Reverse Chop with Resistance - 3 sets - 10 reps - 1x daily   Supine Bridge - 1-3 sets - 10 reps - 1-5 sec hold - 1x daily   Reverse Lunge - 1-3 sets - 10 reps   Push Up on Table - 1-3 sets - 5-10 reps - 1x daily   PT Education - 05/14/19 1337    Education Details  Exercise purpose/form. Self management techniques. Education on diagnosis, prognosis,  POC, anatomy and physiology of current condition    Person(s) Educated  Patient    Methods  Explanation;Demonstration    Comprehension  Verbalized understanding;Returned demonstration;Verbal cues required;Tactile cues required       PT Short Term Goals - 04/23/19 1806      PT SHORT TERM GOAL #1   Title  Be independent with home exercise program completed at least 3 times per week for self-management of symptoms.    Baseline  Initial HEP provided at initial eval (11/04/2018);    Time  2    Period  Weeks    Status  Achieved    Target Date  11/19/18      PT SHORT TERM GOAL #2   Title  Reduce pain to 8/10 with functional activities to allow patient to complete valued functional tasks such as sit to stand with less difficulty.    Baseline  reports 10/10 at initial evaluation (11/04/2018); 9.5/10 (11/28/2018); 8/10 at night while trying to sleep (12/09/2018)    Time  2    Period  Weeks    Status  Achieved    Target Date  12/12/18      PT SHORT TERM GOAL #3   Title  Improve lumbar AROM to 75% to allow patient to complete valued activities with less difficulty.     Baseline  50% with end range pain at initial evaluaiton (11/04/2018);     Time  2    Period  Weeks    Status  Achieved    Target Date  11/19/18        PT Long Term Goals - 04/23/19 1806      PT LONG TERM GOAL #1   Title  Be independent with a long-term home exercise program for self-management of symptoms.     Baseline  Initial HEP provided at initial eval (11/04/2018); Initial HEP and progressions have been provided but not final progressions (11/28/2018); Initial HEP and progressions have been provided but not final progressions (12/09/2018; 01/06/2019; 02/26/2019; 04/23/2019)    Time  6    Period  Weeks    Status  Partially Met    Target Date  06/03/19      PT LONG TERM GOAL #2   Title  Demonstrate improved FOTO score by 10 units to demonstrate improvement in overall condition  and self-reported functional ability.      Baseline  Foto = 47 (11/04/2018); too early to re-measure (11/28/2018); not measured (12/09/2018); 44 (01/06/2019, 02/26/2019 - questionnaire is only asking one question, no longer valid, see new goal).     Time  6    Period  Weeks    Status  Deferred    Target Date  03/03/19      PT LONG TERM GOAL #3   Title  Complete community, work and/or recreational activities without limitation due to current condition.     Baseline   limited in daily activities and responsibilities, limited to 5# of lifting, disrupts sleep. 5# weight lift limit (11/04/2018); pt reports increased activty since starting PT, continues to have similar limitations to baseline (11/28/2018);; patient reports increased activity since strating PT, continues to have similar limitations to baseline but with slightly lower pain (12/09/2018); continues to have similar limitations but with less pain overall (01/06/2019); feels similar limitations but more better days than bad days now (02/26/2019).; continues to have difficulty picking up son (04/23/2019);     Time  6    Period  Weeks    Status  Partially Met    Target Date  06/03/19      PT LONG TERM GOAL #4   Title  Patient will increase BLE gross strength to 4+/5 as to improve functional strength for independent gait, increased standing tolerance and increased ADL ability.    Baseline  Hip: Abduction: R = 3+/5 painful , L = 4+/5 painful!. Hip Flexion: R = 3+/5, L = 4/5. Knee Flex: R = 4/5, L = 5/5. (11/04/2018); see objective exam (01/06/2019);  see objective exam (02/26/2019); did not measure formally (04/23/2019)    Time  6    Period  Weeks    Status  Partially Met    Target Date  06/03/19      PT LONG TERM GOAL #5   Title  Be able to squat to chair height with proper form without limitation due to current condition in order to improve ability to lift and complete transfers during usual activities.     Baseline  Difficulty with sit<>stand transfers due to pain but less than at initial  eval (11/28/2018); improved sit <> stand transfers with reduced pain compared to initial eval (12/09/2018);; able to complete partial squat and improved sit to stand but continued pain (01/06/2019);  able to quat to chair height with mild discomfort (02/26/2019);     Time  6    Period  Weeks    Status  Achieved    Target Date  04/09/19      Additional Long Term Goals   Additional Long Term Goals  Yes      PT LONG TERM GOAL #6   Title  Demonstrate improved modified Oswestry Disability Index (mODI) score to equal or less than 20% to demonstrate improvement in overall condition and self-reported functional ability.     Baseline  50% (02/26/2019); 42% (04/23/2019);     Time  6    Period  Weeks    Status  Partially Met    Target Date  06/03/19      PT LONG TERM GOAL #7   Title  Patient will be able to complete 5 back lunges with knee to floor and light UE support with good form to improve her activity tolerance for activities requiring assymetrical loading such as picking up her son.     Baseline  Back lunge: excessive forward bend,  unable to touch knee to ground, unsteady, requires unilateral UE support(04/23/2019);     Time  6    Period  Weeks    Status  New    Target Date  06/03/19            Plan - 05/14/19 1429    Clinical Impression Statement  Patient tolerated treatment well and is continuing to make progress towards goals at this point. She had significantly less pain today that did not rise during session and is showing improved trunk control. Was to complete full sets of single leg lower in glute bridge with less pain. Progressed prone pull across exercise to have theraball closer to thighs and demo improved awareness of lumbar control during this, although she has limited ability to maintain flat back in this position. Overall patient demonstrates poor but improving glute strength, R > L that may be contributing to her back pain. Continued to work on complex funcitonal exercises  requiring core, LE, and postural stability and strength with demonstration of improved control and strength over time. Cervical retraction was continued to address the R UE discomfort that has started to affect pt's ability to participate in exercises for the low back. She continued to be less bothered by R arm pain today, suggesting retractions/extenions are helping. She will benefit from continued physical therapy to address remaining impairments and functional deficits and work towards stated goals.     Rehab Potential  Good    Clinical Impairments Affecting Rehab Potential  (+) denies leg symptoms; (-) apparent subjective pain exaggeration, external locus of control, focus on pain.     PT Frequency  2x / week    PT Duration  6 weeks    PT Treatment/Interventions  ADLs/Self Care Home Management;Moist Heat;Cryotherapy;Functional mobility training;Stair training;Therapeutic activities;Therapeutic exercise;Balance training;Neuromuscular re-education;Patient/family education;Manual techniques;Passive range of motion;Dry needling;Spinal Manipulations;Joint Manipulations;Other (comment)    PT Next Visit Plan  progress functional strengthening as tolerated/appropriate. Continue working on lunges, planks, push ups    PT [place]  Access Code: [facility] and Access Code: [email or web address]     Consulted and Agree with Plan of Care  Patient       Patient will benefit from skilled therapeutic intervention in order to improve the following deficits and impairments:  Decreased endurance, Decreased mobility, Difficulty walking, Hypomobility, Increased muscle spasms, Decreased range of motion, Impaired perceived functional ability, Impaired tone, Obesity, Decreased activity tolerance, Decreased strength, Postural dysfunction, Pain, Impaired flexibility  Visit Diagnosis: Chronic bilateral low back pain without sciatica  Difficulty in walking, not elsewhere classified     Problem List Patient  Active Problem List   Diagnosis Date Noted  . Menorrhagia with regular cycle 03/11/2018  . Microcytic anemia 02/25/2018  . Iron deficiency anemia due to chronic blood loss 02/18/2018    Everlean Alstrom. Graylon Good, PT, DPT 05/14/19, 2:30 PM  [facility] [address], [place], [address] Phone: [phone]   Fax:  [phone]  Name: BAILI STANG MRN: [id number] Date of Birth: 22-May-1986

## 2019-05-19 ENCOUNTER — Other Ambulatory Visit: Payer: Self-pay

## 2019-05-19 ENCOUNTER — Encounter: Payer: Self-pay | Admitting: Physical Therapy

## 2019-05-19 ENCOUNTER — Ambulatory Visit: Payer: Medicaid Other | Attending: Nurse Practitioner | Admitting: Physical Therapy

## 2019-05-19 DIAGNOSIS — R262 Difficulty in walking, not elsewhere classified: Secondary | ICD-10-CM | POA: Insufficient documentation

## 2019-05-19 DIAGNOSIS — M545 Low back pain, unspecified: Secondary | ICD-10-CM

## 2019-05-19 DIAGNOSIS — G8929 Other chronic pain: Secondary | ICD-10-CM | POA: Diagnosis present

## 2019-05-19 NOTE — Therapy (Signed)
[facility] [address] [place], [address] Phone: [phone]   Fax:  [phone]  Physical Therapy Treatment  Patient Details  Name: Cathy Trevino MRN: [id number] Date of Birth: [birth year] Referring Provider (PT): Margurite Auerbach, NP-C   Encounter Date: 05/19/2019  PT End of Session - 05/19/19 1140    Visit Number  27    Number of Visits  32    Date for PT Re-Evaluation  06/03/19    Authorization Type  Medicaid reporting period from 04/23/2019; auth through 06/03/2019    Authorization Time Period  Current Certification period: 04/23/2019 - 09/02/3845 (last PN/Re-Cert 05/22/9934)    Authorization - Visit Number  7    Authorization - Number of Visits  10    PT Start Time  1130    PT Stop Time  1215    PT Time Calculation (min)  45 min    Activity Tolerance  Patient limited by pain;Patient limited by fatigue;Patient tolerated treatment well    Behavior During Therapy  [name] for tasks assessed/performed       Past Medical History:  Diagnosis Date  . Anemia   . Anxiety   . Asthma   . Depression   . Menorrhagia     Past Surgical History:  Procedure Laterality Date  . CESAREAN SECTION  2012  . TUBAL LIGATION      There were no vitals filed for this visit.  Subjective Assessment - 05/19/19 1133    Subjective  Patient reports she is feeling well today with pain 3/10 in the familiar R upper lumbar / lower thoracic region. States her pain elevated to 4/10 for the rest of the day following last treatment session but no excessive soreness or pain. States her HEP is going pretty good, but she is still having trouble with the shoulder elevated bridge with the left leg elevated. Reports her R arm has been doing great lately    Pertinent History  Patient is a 33 y.o. female who presents to outpatient physical therapy with a referral for chronic low back pain. This patient's chief complaints consist of pain, leading to  the following functional deficits: difficulty with all activities that require movement or motion including lifting, bending, prolonged positions, sleeping. Relevant past medical history and comorbidities include "musculoskeletal issue around heart" told to take tylenol when needed (sees cardiologist when he feels is necessary, last was July 2018), obesity, previous episodes of low back pain, history of depression and anxiety, anemia. Imaging: From MRI report dated 07/15/2018: "1. 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 2. Diffusely decreased T1 signal throughout the visualized bone marrow, nonspecific, but suspected to be related to patient's history of anemia. 3. Otherwise normal MRI of the lumbar spine."    Limitations  Sitting;Walking;Lifting;House hold activities;Standing    How long can you sit comfortably?  < 10 minutes on bad day. 10 min on a good day.     How long can you stand comfortably?  "I hate that" "I just have to keep moving" "not sure if it is the pain or everything just keeps moving"     How long can you walk comfortably?  20-25 min.     Diagnostic tests  MRI report dated 07/15/2018: "1. 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 2. Diffusely  decreased T1 signal throughout the visualized bone marrow, nonspecific, but suspected to be related to patient's history of anemia. 3. Otherwise normal MRI of the lumbar spine."    Patient Stated Goals  Get rid of the pain so she can function better.     Currently in Pain?  Yes    Pain Score  3     Pain Location  Back    Pain Onset  More than a month ago       TREATMENT:  Denies latex allergy.  Therapeutic exercise:to centralize symptoms and improve ROM and strength required for successful completion of functional activities.  Treadmill  levelup to2.35mh, 5%grade.For improved lower extremity mobility, muscular endurance, and weightbearing activity tolerance; and to induce the analgesic effect of aerobic exercise, stimulate improved joint nutrition, and prepare body structures and systems for following interventions. x5Minutes during subjective exam. - Glute bridges with upper back/shoulders on low plinth, knees flexed to 90 degrees. To table top position. Completedx10 reps with double leg stance. Repeated with double legs up, single leg down x 10 each side.More difficulty lifting left leg due to R back pain that resolves with rest. Able to continue controlling motion during pain when cued. Able to complete all reps today.  - repeated cervical retraction in sitting without back support with self overpressure to extension to continue addressing R UE pain/parestheia that was limiting participation in exercises for lumbar pain. Demo good carry over. No shoulder pain prior or during. x10 - prone knee to chest from plank position with theraball under distal thighs. 2x5 plus additional failed attempts and rest with pauses for improved cuing and demonstration. To improve dynamic trunk stability especially of abdominal muscles, to reduce sagging in plank position noted with plank on ball pull through.  - back lungewithipsilateral UE support using TRX.x10sets each side.Pt demo improved control of descent compared to last week without need for airex under knee this session. Comes to complete rest in kneeling prior to standing up.Excessive forward trunk bend improved with practice. Struggles to maintain tension throughout body and stable core. To improve trunk, hip, LE, and postural control during asymetrical loading to strengthen patterns found challenging/painful in daily activities. - Single leg sit to stand to chair with BUE TRX support. X 10 each side. Ball placed under contralateral foot to help prevent weight bearing or step after  standing. to improve strength, awareness, and activation of standing gluteal region to improve lumbar support during functional activities. Practice followed by x 10 each side plus additional reps to learn exercise.  - supine bridge with legs on swiss ball, to roll ball in cephalic direction and back before letting hips down. x5 at end of session. Very fatigued, difficulty with form. To improve trunk and hip strength. - Education on HEP including handout   HOME EXERCISE PROGRAM Access Code: [email or web address] [email or web address] URL: [email or web address]  Date: 05/19/2019  Prepared by: SRosita Kea  Exercises  Cervical Retraction with Overpressure - 10-15 reps - 1 second hold - 4x daily  Supine Hamstring Curl on Swiss Ball - 1-3 sets - 10 reps  Plank with Legs on Swiss Ball and Knee Curls - 3 sets - 5 reps - 2-3x daily  Supine Hip Extension on Bench - 1-3 sets - 10-15 reps - 1 second hold   Access Code: [email or web address]  URL: [email or web address]  Date: 04/08/2019  Prepared by: SRosita Kea  Exercises   Squat with Chair Touch - 10-15 reps - 1 second hold - 3 Sets - 1x  daily - 7x weekly   Bird Dog - 10-15 reps - 1 second hold - 3 Sets - 1x daily - 7x weekly   Dead Bug with Swiss Ball - 10-15 reps - 1 second hold - 3 Sets - 1x daily - 7x weekly   Side Plank on Knees - 10-15 reps - 1 second hold - 3 Sets - 1x daily - 3x weekly   Staggered Stance Single Arm Row with Anchored Resistance - 10-15 reps - 1 second hold - 3 Sets - 1x daily - 3x weekly   Seated Correct Posture   Walking - 30 min hold - 4x weekly   Standing Diagonal Chop - 3 sets - 10 reps - 1x daily   Reverse Chop with Resistance - 3 sets - 10 reps - 1x daily   Supine Bridge - 1-3 sets - 10 reps - 1-5 sec hold - 1x daily   Reverse Lunge - 1-3 sets - 10 reps   Push Up on Table - 1-3 sets - 5-10 reps - 1x daily    PT Education - 05/19/19 1140    Education Details  Exercise purpose/form. Self management techniques.  Education on diagnosis, prognosis, POC, anatomy and physiology of current condition    Person(s) Educated  Patient    Methods  Explanation;Demonstration;Tactile cues;Verbal cues    Comprehension  Verbalized understanding;Returned demonstration;Verbal cues required;Tactile cues required       PT Short Term Goals - 04/23/19 1806      PT SHORT TERM GOAL #1   Title  Be independent with home exercise program completed at least 3 times per week for self-management of symptoms.    Baseline  Initial HEP provided at initial eval (11/04/2018);    Time  2    Period  Weeks    Status  Achieved    Target Date  11/19/18      PT SHORT TERM GOAL #2   Title  Reduce pain to 8/10 with functional activities to allow patient to complete valued functional tasks such as sit to stand with less difficulty.    Baseline  reports 10/10 at initial evaluation (11/04/2018); 9.5/10 (11/28/2018); 8/10 at night while trying to sleep (12/09/2018)    Time  2    Period  Weeks    Status  Achieved    Target Date  12/12/18      PT SHORT TERM GOAL #3   Title  Improve lumbar AROM to 75% to allow patient to complete valued activities with less difficulty.     Baseline  50% with end range pain at initial evaluaiton (11/04/2018);     Time  2    Period  Weeks    Status  Achieved    Target Date  11/19/18        PT Long Term Goals - 04/23/19 1806      PT LONG TERM GOAL #1   Title  Be independent with a long-term home exercise program for self-management of symptoms.     Baseline  Initial HEP provided at initial eval (11/04/2018); Initial HEP and progressions have been provided but not final progressions (11/28/2018); Initial HEP and progressions have been provided but not final progressions (12/09/2018; 01/06/2019; 02/26/2019; 04/23/2019)    Time  6    Period  Weeks    Status  Partially Met    Target Date  06/03/19      PT LONG TERM GOAL #2   Title  Demonstrate improved FOTO score by 10 units  to demonstrate improvement in  overall condition and self-reported functional ability.     Baseline  Foto = 47 (11/04/2018); too early to re-measure (11/28/2018); not measured (12/09/2018); 44 (01/06/2019, 02/26/2019 - questionnaire is only asking one question, no longer valid, see new goal).     Time  6    Period  Weeks    Status  Deferred    Target Date  03/03/19      PT LONG TERM GOAL #3   Title  Complete community, work and/or recreational activities without limitation due to current condition.     Baseline   limited in daily activities and responsibilities, limited to 5# of lifting, disrupts sleep. 5# weight lift limit (11/04/2018); pt reports increased activty since starting PT, continues to have similar limitations to baseline (11/28/2018);; patient reports increased activity since strating PT, continues to have similar limitations to baseline but with slightly lower pain (12/09/2018); continues to have similar limitations but with less pain overall (01/06/2019); feels similar limitations but more better days than bad days now (02/26/2019).; continues to have difficulty picking up son (04/23/2019);     Time  6    Period  Weeks    Status  Partially Met    Target Date  06/03/19      PT LONG TERM GOAL #4   Title  Patient will increase BLE gross strength to 4+/5 as to improve functional strength for independent gait, increased standing tolerance and increased ADL ability.    Baseline  Hip: Abduction: R = 3+/5 painful , L = 4+/5 painful!. Hip Flexion: R = 3+/5, L = 4/5. Knee Flex: R = 4/5, L = 5/5. (11/04/2018); see objective exam (01/06/2019);  see objective exam (02/26/2019); did not measure formally (04/23/2019)    Time  6    Period  Weeks    Status  Partially Met    Target Date  06/03/19      PT LONG TERM GOAL #5   Title  Be able to squat to chair height with proper form without limitation due to current condition in order to improve ability to lift and complete transfers during usual activities.     Baseline  Difficulty with  sit<>stand transfers due to pain but less than at initial eval (11/28/2018); improved sit <> stand transfers with reduced pain compared to initial eval (12/09/2018);; able to complete partial squat and improved sit to stand but continued pain (01/06/2019);  able to quat to chair height with mild discomfort (02/26/2019);     Time  6    Period  Weeks    Status  Achieved    Target Date  04/09/19      Additional Long Term Goals   Additional Long Term Goals  Yes      PT LONG TERM GOAL #6   Title  Demonstrate improved modified Oswestry Disability Index (mODI) score to equal or less than 20% to demonstrate improvement in overall condition and self-reported functional ability.     Baseline  50% (02/26/2019); 42% (04/23/2019);     Time  6    Period  Weeks    Status  Partially Met    Target Date  06/03/19      PT LONG TERM GOAL #7   Title  Patient will be able to complete 5 back lunges with knee to floor and light UE support with good form to improve her activity tolerance for activities requiring assymetrical loading such as picking up her son.     Baseline  Back lunge: excessive forward bend, unable to touch knee to ground, unsteady, requires unilateral UE support(04/23/2019);     Time  6    Period  Weeks    Status  New    Target Date  06/03/19            Plan - 05/19/19 1230    Clinical Impression Statement  Patient tolerated treatment well and is making progress towards goals. She was abe to progress to no airex pad under knee during lunges and start ball plank double knees to chest and single leg squats. She fatigued quickly and continues to demonstrate poor core and glute strength, endurance, and control. No pain today during single leg glute bridges but did have increase in pain overall to 4/10 by end of session. Overall patient demonstrates poor but improving glute strength, R > L that may be contributing to her back pain. Continued to work on complex funcitonal exercises requiring core, LE,  and postural stability and strength with demonstration of improved control and strength over time. Cervical retraction was continued to address the R UE discomfort that has started to affect pt's ability to participate in exercises for the low back. No R arm pain today, suggesting retractions/extenions are helping. She will benefit from continued physical therapy to address remaining impairments and functional deficits and work towards stated goals.     Rehab Potential  Good    Clinical Impairments Affecting Rehab Potential  (+) denies leg symptoms; (-) apparent subjective pain exaggeration, external locus of control, focus on pain.     PT Frequency  2x / week    PT Duration  6 weeks    PT Treatment/Interventions  ADLs/Self Care Home Management;Moist Heat;Cryotherapy;Functional mobility training;Stair training;Therapeutic activities;Therapeutic exercise;Balance training;Neuromuscular re-education;Patient/family education;Manual techniques;Passive range of motion;Dry needling;Spinal Manipulations;Joint Manipulations;Other (comment)    PT Next Visit Plan  progress functional strengthening as tolerated/appropriate. Continue working on [employer] and core strength    PT [place]  Access Code: [facility] and Access Code: [email or web address]     Consulted and Agree with Plan of Care  Patient       Patient will benefit from skilled therapeutic intervention in order to improve the following deficits and impairments:  Decreased endurance, Decreased mobility, Difficulty walking, Hypomobility, Increased muscle spasms, Decreased range of motion, Impaired perceived functional ability, Impaired tone, Obesity, Decreased activity tolerance, Decreased strength, Postural dysfunction, Pain, Impaired flexibility  Visit Diagnosis: Chronic bilateral low back pain without sciatica  Difficulty in walking, not elsewhere classified     Problem List Patient Active Problem List   Diagnosis Date Noted  . Menorrhagia  with regular cycle 03/11/2018  . Microcytic anemia 02/25/2018  . Iron deficiency anemia due to chronic blood loss 02/18/2018    Everlean Alstrom. Graylon Good, PT, DPT 05/19/19, 12:31 PM  [facility] [address], [place], [address] Phone: [phone]   Fax:  [phone]  Name: LATEYA DAURIA MRN: [id number] Date of Birth: 1986-03-31

## 2019-05-21 ENCOUNTER — Ambulatory Visit: Payer: Medicaid Other | Admitting: Physical Therapy

## 2019-05-26 ENCOUNTER — Encounter: Payer: Medicaid Other | Admitting: Physical Therapy

## 2019-05-27 ENCOUNTER — Other Ambulatory Visit: Payer: Self-pay

## 2019-05-27 ENCOUNTER — Ambulatory Visit: Payer: Medicaid Other | Admitting: Physical Therapy

## 2019-05-27 ENCOUNTER — Encounter: Payer: Self-pay | Admitting: Physical Therapy

## 2019-05-27 DIAGNOSIS — R262 Difficulty in walking, not elsewhere classified: Secondary | ICD-10-CM

## 2019-05-27 DIAGNOSIS — M545 Low back pain: Secondary | ICD-10-CM | POA: Diagnosis not present

## 2019-05-27 DIAGNOSIS — G8929 Other chronic pain: Secondary | ICD-10-CM

## 2019-05-27 NOTE — Therapy (Signed)
[facility] [address] [place], [address] Phone: [phone]   Fax:  [phone]  Physical Therapy Treatment  Patient Details  Name: Cathy Trevino MRN: [id number] Date of Birth: 1986/01/06 Referring Provider (PT): Cathy Auerbach, NP-C   Encounter Date: 05/27/2019  PT End of Session - 05/27/19 1133    Visit Number  28    Number of Visits  32    Date for PT Re-Evaluation  06/03/19    Authorization Type  Medicaid reporting period from 04/23/2019; auth through 06/03/2019    Authorization Time Period  Current Certification period: 04/23/2019 - 5/36/4680 (last PN/Re-Cert 02/17/1223)    Authorization - Visit Number  8    Authorization - Number of Visits  10    PT Start Time  1130    PT Stop Time  1215    PT Time Calculation (min)  45 min    Activity Tolerance  Patient limited by pain;Patient limited by fatigue    Behavior During Therapy  [facility] for tasks assessed/performed       Past Medical History:  Diagnosis Date  . Anemia   . Anxiety   . Asthma   . Depression   . Menorrhagia     Past Surgical History:  Procedure Laterality Date  . CESAREAN SECTION  2012  . TUBAL LIGATION      There were no vitals filed for this visit.  Subjective Assessment - 05/27/19 1133    Subjective  Patient reports that she felt good following last treatment session, but about 3 days ago her pain started increasing and she started feeling it lower on the right side and over on the left side a well towards the base of her lumbar spine. She rates her pain as 4/10 today in those places and says the pain comes and goes. It has been a little over a week since her last treatment session after missing last session because she had to take her mother to an appointment at that time. She states she has been not doing much and staying at home. She reports her HEP is going well but the bridges are still a challenge. She does some of her exercises  daily with an every other day schedule for most exercises. R arm has continued to bother him some and yesterday felt like she had a sprain at her volar wrist. Applied muscle rub and it feels better today.     Pertinent History  Patient is a 33 y.o. female who presents to outpatient physical therapy with a referral for chronic low back pain. This patient's chief complaints consist of pain, leading to the following functional deficits: difficulty with all activities that require movement or motion including lifting, bending, prolonged positions, sleeping. Relevant past medical history and comorbidities include "musculoskeletal issue around heart" told to take tylenol when needed (sees cardiologist when he feels is necessary, last was July 2018), obesity, previous episodes of low back pain, history of depression and anxiety, anemia. Imaging: From MRI report dated 07/15/2018: "1. 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 2. Diffusely decreased T1 signal throughout the visualized bone marrow, nonspecific, but suspected to be related to patient's history of anemia. 3. Otherwise normal MRI of the lumbar spine."    Limitations  Sitting;Walking;Lifting;House hold activities;Standing    How long can you sit comfortably?  < 10 minutes on bad day. 10  min on a good day.     How long can you stand comfortably?  "I hate that" "I just have to keep moving" "not sure if it is the pain or everything just keeps moving"     How long can you walk comfortably?  20-25 min.     Diagnostic tests  MRI report dated 07/15/2018: "1. 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 2. Diffusely decreased T1 signal throughout the visualized bone marrow, nonspecific, but suspected to be related to patient's history of anemia. 3. Otherwise normal MRI  of the lumbar spine."    Patient Stated Goals  Get rid of the pain so she can function better.     Currently in Pain?  Yes    Pain Score  4     Pain Location  Back    Pain Orientation  Left;Right;Lower    Pain Descriptors / Indicators  Dull;Sharp   sometimes sharp on left side   Pain Type  Chronic pain    Pain Onset  More than a month ago          TREATMENT:  Denies latex allergy.  Therapeutic exercise:to centralize symptoms and improve ROM and strength required for successful completion of functional activities.  Treadmill levelup to2.47mh, 5%grade.For improved lower extremity mobility, muscular endurance, and weightbearing activity tolerance; and to induce the analgesic effect of aerobic exercise, stimulate improved joint nutrition, and prepare body structures and systems for following interventions. x5Minutes during subjective exam. - repeated cervical retraction in sitting without back support with self overpressure to extension and rotation to continue addressing R UE pain/parestheia that was limiting participation in exercises for lumbar pain. Demo good carry over. No shoulder pain prior or during. x15 - Glute bridges with upper back/shoulders on low plinth, knees flexed to 90 degrees. To table top position. Completedx10 reps with double leg stance. Repeated with double legs up. Then attempted up with marching, x 5 reps first set, x 3 reps second set until discontinued due to L sided low back pain, worst with L foot off ground.   - prone press up 2x10 with hands elevated second set. ERP, uncomfortable. No referral of pain.  - low trunk rotation in hooklying x 15; uncomfortable to roll hips to left each time. Continues to be uncomfortable throughout. Focus on breathing and normal movement - Sidelying open book (thoracic rotation) to improve thoracic, shoulder girdle, and upper trunk mobility. Required instruction for technique and cuing to achieve end range as tolerated,  hold time, and breathing technique. 5 second holds. X 5 rep left side moving, continues to be painful  5 reps R side moving, hurts a lot.  -  Seated thoracic extension x 10 over back of chair with airex in chair to address lower half of thoracic spine. Painful, no worse.  - seated active trunk rotation to end range: painful to left, not painful to R. 1 rep each.  - seated isometric trunk rotation painful left, not painful right. No worse following. X 5 reps each way for isometric strengthening.  - seated isometric trunk sidebend x 2 reps each way, painful both directions.  - Education on diagnosis, prognosis, POC, anatomy and physiology of current condition. Noticed apparent horizontal nystagmus during conversation. Stops with fixation on single target (such as one eye) but occurs without prompting or symptoms when speaking to clinician and looking at both eyes at once.   Manual therapy:  to reduce pain and tissue tension, improve range of motion, neuromodulation, in order to promote improved ability to complete functional activities. - CPA and UPA assessment of levels of the lumbar spine. Most tender to CPA and L UPA near L1. Attempted a few repeated CPA and UPA grade II-III to tender segment to attempt to decrease pain without success.  - Prone press up with clinician OP at tender segment x 2 but discontinued due to sharp increase in pain.  - STM with palpation to lumbar and lower thoracic muscles and glutes to further assess and attempt to alleviate pain. Painful at left paraspinals and surrounding muscles with epicenter at upper lumbar / lower thoracic region. Minimal to no tenderness noted on R side in upper thoracic spine or bilateral glutes. Palpation and pressure to bilateral flanks tender but not more-so than muscles and joints.    HOME EXERCISE PROGRAM Access Code: [email or web address]  URL: [email or web address]  Date: 05/19/2019  Prepared by: Rosita Kea   Exercises   Cervical  Retraction with Overpressure - 10-15 reps - 1 second hold - 4x daily   Supine Hamstring Curl on Swiss Ball - 1-3 sets - 10 reps   Plank with Legs on Swiss Ball and Knee Curls - 3 sets - 5 reps - 2-3x daily   Supine Hip Extension on Bench - 1-3 sets - 10-15 reps - 1 second hold   Access Code: [email or web address]  URL: [email or web address]  Date: 04/08/2019  Prepared by: Rosita Kea   Exercises   Squat with Chair Touch - 10-15 reps - 1 second hold - 3 Sets - 1x daily - 7x weekly   Bird Dog - 10-15 reps - 1 second hold - 3 Sets - 1x daily - 7x weekly   Dead Bug with Swiss Ball - 10-15 reps - 1 second hold - 3 Sets - 1x daily - 7x weekly   Side Plank on Knees - 10-15 reps - 1 second hold - 3 Sets - 1x daily - 3x weekly   Staggered Stance Single Arm Row with Anchored Resistance - 10-15 reps - 1 second hold - 3 Sets - 1x daily - 3x weekly   Seated Correct Posture   Walking - 30 min hold - 4x weekly   Standing Diagonal Chop - 3 sets - 10 reps - 1x daily   Reverse Chop with Resistance - 3 sets - 10 reps - 1x daily   Supine Bridge - 1-3 sets - 10 reps - 1-5 sec hold - 1x daily   Reverse Lunge - 1-3 sets - 10 reps   Push Up on Table - 1-3 sets - 5-10 reps - 1x daily    PT Education - 05/27/19 1137    Education Details  Exercise purpose/form. Self management techniques. Education on diagnosis, prognosis, POC, anatomy and physiology of current condition    Person(s) Educated  Patient    Methods  Explanation;Demonstration;Tactile cues;Verbal cues    Comprehension  Returned demonstration;Verbalized understanding;Verbal cues required;Tactile cues required       PT Short Term Goals - 04/23/19 1806      PT SHORT TERM GOAL #1   Title  Be independent with home exercise program completed at least 3 times per week for self-management of symptoms.    Baseline  Initial HEP provided at initial eval (11/04/2018);    Time  2    Period  Weeks    Status  Achieved    Target Date   11/19/18  PT SHORT TERM GOAL #2   Title  Reduce pain to 8/10 with functional activities to allow patient to complete valued functional tasks such as sit to stand with less difficulty.    Baseline  reports 10/10 at initial evaluation (11/04/2018); 9.5/10 (11/28/2018); 8/10 at night while trying to sleep (12/09/2018)    Time  2    Period  Weeks    Status  Achieved    Target Date  12/12/18      PT SHORT TERM GOAL #3   Title  Improve lumbar AROM to 75% to allow patient to complete valued activities with less difficulty.     Baseline  50% with end range pain at initial evaluaiton (11/04/2018);     Time  2    Period  Weeks    Status  Achieved    Target Date  11/19/18        PT Long Term Goals - 04/23/19 1806      PT LONG TERM GOAL #1   Title  Be independent with a long-term home exercise program for self-management of symptoms.     Baseline  Initial HEP provided at initial eval (11/04/2018); Initial HEP and progressions have been provided but not final progressions (11/28/2018); Initial HEP and progressions have been provided but not final progressions (12/09/2018; 01/06/2019; 02/26/2019; 04/23/2019)    Time  6    Period  Weeks    Status  Partially Met    Target Date  06/03/19      PT LONG TERM GOAL #2   Title  Demonstrate improved FOTO score by 10 units to demonstrate improvement in overall condition and self-reported functional ability.     Baseline  Foto = 47 (11/04/2018); too early to re-measure (11/28/2018); not measured (12/09/2018); 44 (01/06/2019, 02/26/2019 - questionnaire is only asking one question, no longer valid, see new goal).     Time  6    Period  Weeks    Status  Deferred    Target Date  03/03/19      PT LONG TERM GOAL #3   Title  Complete community, work and/or recreational activities without limitation due to current condition.     Baseline   limited in daily activities and responsibilities, limited to 5# of lifting, disrupts sleep. 5# weight lift limit  (11/04/2018); pt reports increased activty since starting PT, continues to have similar limitations to baseline (11/28/2018);; patient reports increased activity since strating PT, continues to have similar limitations to baseline but with slightly lower pain (12/09/2018); continues to have similar limitations but with less pain overall (01/06/2019); feels similar limitations but more better days than bad days now (02/26/2019).; continues to have difficulty picking up son (04/23/2019);     Time  6    Period  Weeks    Status  Partially Met    Target Date  06/03/19      PT LONG TERM GOAL #4   Title  Patient will increase BLE gross strength to 4+/5 as to improve functional strength for independent gait, increased standing tolerance and increased ADL ability.    Baseline  Hip: Abduction: R = 3+/5 painful , L = 4+/5 painful!. Hip Flexion: R = 3+/5, L = 4/5. Knee Flex: R = 4/5, L = 5/5. (11/04/2018); see objective exam (01/06/2019);  see objective exam (02/26/2019); did not measure formally (04/23/2019)    Time  6    Period  Weeks    Status  Partially Met    Target Date  06/03/19  PT LONG TERM GOAL #5   Title  Be able to squat to chair height with proper form without limitation due to current condition in order to improve ability to lift and complete transfers during usual activities.     Baseline  Difficulty with sit<>stand transfers due to pain but less than at initial eval (11/28/2018); improved sit <> stand transfers with reduced pain compared to initial eval (12/09/2018);; able to complete partial squat and improved sit to stand but continued pain (01/06/2019);  able to quat to chair height with mild discomfort (02/26/2019);     Time  6    Period  Weeks    Status  Achieved    Target Date  04/09/19      Additional Long Term Goals   Additional Long Term Goals  Yes      PT LONG TERM GOAL #6   Title  Demonstrate improved modified Oswestry Disability Index (mODI) score to equal or less than 20% to  demonstrate improvement in overall condition and self-reported functional ability.     Baseline  50% (02/26/2019); 42% (04/23/2019);     Time  6    Period  Weeks    Status  Partially Met    Target Date  06/03/19      PT LONG TERM GOAL #7   Title  Patient will be able to complete 5 back lunges with knee to floor and light UE support with good form to improve her activity tolerance for activities requiring assymetrical loading such as picking up her son.     Baseline  Back lunge: excessive forward bend, unable to touch knee to ground, unsteady, requires unilateral UE support(04/23/2019);     Time  6    Period  Weeks    Status  New    Target Date  06/03/19            Plan - 05/27/19 1344    Clinical Impression Statement  Patient had difficulty tolerating treatment today due to increased left-sided low back pain. It was tender to palpation, CPA, and L UPA near upper lumbar spine. R side minimally painful today. Exercises were modified today to further assess and accomodate pain without success. Patient's pain sensitive to changes in position and manual pressure on muscle bulk and joints. TTP at flanks but not more so than with palpation to muscles and joints which does not suggest a primarily non-musculoskeletal source of pain. Patient does have an upcoming visit to the cancer center for blood infusion due to chronic anemia. Up to this point, patient had been making progress with improved activity tolerance to increasing loads, although overall pain continues to be similar most visits. Patient with noted aberrant eye movement during conversation. Patient has expressed that she is losing her hearing for an unknown reason and is getting hearing aides soon. Discussed continued difficulty with low back pain and that insurance limitations may bring her visits to an end soon. Suggest that patient return to physician to further explore any other options to improve her pain level with plan to focus on  finalizing long term HEP at future PT visits in expectation of upcoming discharge. Patient would benefit from continued physical therapy to address remaining impairments and functional limitations to work towards stated goals and return to PLOF or maximal functional independence.     Rehab Potential  Good    Clinical Impairments Affecting Rehab Potential  (+) denies leg symptoms; (-) apparent subjective pain exaggeration, external locus of control, focus on  pain.     PT Frequency  2x / week    PT Duration  6 weeks    PT Treatment/Interventions  ADLs/Self Care Home Management;Moist Heat;Cryotherapy;Functional mobility training;Stair training;Therapeutic activities;Therapeutic exercise;Balance training;Neuromuscular re-education;Patient/family education;Manual techniques;Passive range of motion;Dry needling;Spinal Manipulations;Joint Manipulations;Other (comment)    PT Next Visit Plan  suggest follow up with physician for any further assessment/advice for improving low back pain. Finalize long term HEP to prepare for discharge.     PT Home Exercise Plan  Medbridge  Access Code: [facility] and Access Code: [email or web address]     Consulted and Agree with Plan of Care  Patient       Patient will benefit from skilled therapeutic intervention in order to improve the following deficits and impairments:  Decreased endurance, Decreased mobility, Difficulty walking, Hypomobility, Increased muscle spasms, Decreased range of motion, Impaired perceived functional ability, Impaired tone, Obesity, Decreased activity tolerance, Decreased strength, Postural dysfunction, Pain, Impaired flexibility  Visit Diagnosis: Chronic bilateral low back pain without sciatica  Difficulty in walking, not elsewhere classified     Problem List Patient Active Problem List   Diagnosis Date Noted  . Menorrhagia with regular cycle 03/11/2018  . Microcytic anemia 02/25/2018  . Iron deficiency anemia due to chronic blood loss 02/18/2018     Everlean Alstrom. Graylon Good, PT, DPT 05/27/19, 1:46 PM  [facility] [address] [place], [address] Phone: [phone]   Fax:  [phone]  Name: Cathy Trevino MRN: [id number] Date of Birth: [birth year]

## 2019-05-28 ENCOUNTER — Ambulatory Visit: Payer: Medicaid Other | Admitting: Physical Therapy

## 2019-06-02 ENCOUNTER — Encounter: Payer: Medicaid Other | Admitting: Physical Therapy

## 2019-06-03 ENCOUNTER — Other Ambulatory Visit: Payer: Self-pay

## 2019-06-03 ENCOUNTER — Encounter: Payer: Self-pay | Admitting: Physical Therapy

## 2019-06-03 ENCOUNTER — Ambulatory Visit: Payer: Medicaid Other | Admitting: Physical Therapy

## 2019-06-03 DIAGNOSIS — M545 Low back pain: Secondary | ICD-10-CM | POA: Diagnosis not present

## 2019-06-03 DIAGNOSIS — R262 Difficulty in walking, not elsewhere classified: Secondary | ICD-10-CM

## 2019-06-03 DIAGNOSIS — G8929 Other chronic pain: Secondary | ICD-10-CM

## 2019-06-03 NOTE — Therapy (Signed)
[facility] [address], [place], [address] Phone: [phone]   Fax:  [phone]  Physical Therapy Treatment / Progress Note / Re-Certification Reporting Period: 04/23/2019 - 06/03/2019  Patient Details  Name: Cathy Trevino MRN: [id number] Date of Birth: 08-28-1986 Referring Provider (PT): Margurite Auerbach, NP-C   Encounter Date: 06/03/2019  PT End of Session - 06/03/19 1644    Visit Number  29    Number of Visits  32    Date for PT Re-Evaluation  06/03/19    Authorization Type  Medicaid reporting period from 04/23/2019; auth through 06/03/2019    Authorization Time Period  Current Certification period: 04/23/2019 - 7/56/4332 (last PN/Re-Cert 08/23/1883)    Authorization - Visit Number  9    Authorization - Number of Visits  10    PT Start Time  1640   patient was late and called ahead to make sure she could still attend   PT Stop Time  1730    PT Time Calculation (min)  50 min    Activity Tolerance  Patient limited by fatigue    Behavior During Therapy  [facility] for tasks assessed/performed       Past Medical History:  Diagnosis Date  . Anemia   . Anxiety   . Asthma   . Depression   . Menorrhagia     Past Surgical History:  Procedure Laterality Date  . CESAREAN SECTION  2012  . TUBAL LIGATION      There were no vitals filed for this visit.  Subjective Assessment - 06/03/19 1645    Subjective  Patient reports that since last treatment session she saw her referring physician who prescribed more physical therapy and also is referring her to [facility] for further evaluation with potential for injections or further intervention for ongoing pain. Patient also mentioned the abnormal eye movements and and referring clinician was unconcerned at this time. Patient reports she feels physical therapy is helping her manage her pain better at home so it doesn't "rob her whole day" and she is able to function  despite having some pain and also keep control of the pain she has. She continues to have difficulty lifting her child who is over 50# and would like to continue PT to continue getting stronger to be able to manage his behavior better without increased pain. He is austistic and throws temper tantrums that require her to physically move him from the floor. She states her pain is 5/10 today and continues to fluxuate over time. She reports her highest pain is now 5/10.    Pertinent History  Patient is a 33 y.o. female who presents to outpatient physical therapy with a referral for chronic low back pain. This patient's chief complaints consist of pain, leading to the following functional deficits: difficulty with all activities that require movement or motion including lifting, bending, prolonged positions, sleeping. Relevant past medical history and comorbidities include "musculoskeletal issue around heart" told to take tylenol when needed (sees cardiologist when he feels is necessary, last was July 2018), obesity, previous episodes of low back pain, history of depression and anxiety, anemia. Imaging: From MRI report dated 07/15/2018: "1. 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 2. Diffusely decreased T1 signal throughout the visualized bone marrow, nonspecific, but suspected to be related to patient's history of anemia. 3. Otherwise normal MRI of the lumbar spine."  Limitations  Sitting;Walking;Lifting;House hold activities;Standing    How long can you sit comfortably?  ~ 20 mins on a surface with a cushon, less than that on a wooden chair.    How long can you stand comfortably?  about 15 minutes    How long can you walk comfortably?  10 min    Diagnostic tests  MRI report dated 07/15/2018: "1. Central/left subarticular disc protrusion at L5-S1 extending into the left lateral recess and impinging upon the  descending left S1 nerve root. 2. Diffusely decreased T1 signal throughout the visualized bone marrow, nonspecific, but suspected to be related to patient's history of anemia. 3. Otherwise normal MRI of the lumbar spine."    Patient Stated Goals  Get rid of the pain so she can function better.     Currently in Pain?  Yes    Pain Score  5     Pain Location  Back    Pain Orientation  Right;Mid   upper lumbar and lower thoracic region   Pain Descriptors / Indicators  Dull;Sharp   getting sharper   Pain Type  Chronic pain    Pain Radiating Towards  upper lumbar / lower thoracic to top of R iliac crest.    Pain Onset  More than a month ago    Pain Frequency  Intermittent    Aggravating Factors   any position too long, getting up in the morning, liftin son (>50#), sititng and standing    Pain Relieving Factors  heating pad, 3-sided pillow, tylenol    Effect of Pain on Daily Activities  Is most limited in lifting her autistic son who is over 50# and throws temper tantrums. Also interferes with ADLs, IADLs, caring for family, participating in usual activities. Continues to feel she gains confidence and motivation for improved management strategies in the face of pain.         [facility] PT Assessment - 06/03/19 0001      Assessment   Medical Diagnosis  Chronic low back pain    Referring Provider (PT)  Margurite Auerbach, NP-C    Onset Date/Surgical Date  07/05/18    Next MD Visit  unknown    Prior Therapy  has been attending PT at current clinic since November 2019 with good improvements in strength and function, moderate improvements in pain      Precautions   Precautions  Other (comment)   no lifting over 5#     Restrictions   Weight Bearing Restrictions  No      Prior Function   Level of Independence  Independent    Vocation  Works at home    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  - DTR absent at bilateral quad and achilles (pt having difficulty relaxing) - Negative for clonus at ankle, and Hoffman's reflex bilaterally.   SPINE MOTION Lumbar AROM:  *Indicates pain  Flexion: = 1 inch above toes,(painful going down).  Extension: =75%, ERP  Rotation: R =100% mild ERP, L =100% ERP!  Side Flexion: R =justproximal to patella (ERP on R side), L =proximal to patella, (ERP on R side); L worse than R  PERIPHERAL JOINT MOTION (AROM/PROM in degrees):  *Indicates pain  BLE grossly WFL for basic mobility.   STRENGTH:   B hip flexion = 4+/5 uncomfortable  BLE WFL grossly 5/5 except L hip abduction =  4+/5. Bilateral hip extension 3+/5 and painful with knee extended and flexed to 90 degrees.  Demonstrates significant functional weakness in hip extension that limits single leg bridges, and lunges (see below).   Able to heel walk and toe walk with bilaterally equal strength.   FUNCTIONALTESTING: - Sit to stand in 30 seconds: x12 mild pain (no pain increase from baseline) - able to squat with good form to chair height, mild pain x10 (no pain increase from today's baseline) - Push up: x10 modified to height of treadmill bar, attempted at tall plinth but unable to perform more than 3 with questionable form.  - Back lunge: after several attempts with single UE support, pt able to perform x 5 each side with no UE with pauses in between and 1-2 failed attempts each side. Knee to descend on airex with full  weight bearing. Lacks strength and control to descend in controlled manner and has difficulty getting past initial thrust to rise but now able to complete several reps with good form, although still lacking strength to prevent full weight bearing on knee. Strength deficit appears to be most in bilateral gluteus.  - bridges: difficulty with single leg bridge with shoulders elevated (unable to perform concentric phase and painful eccentric phase) but able to complete x10 each side without shoulders elevated in 75% of available ROM.  - front plank: ball at upper thighs, supported on hands: able to hold for 10 seconds without back sagging and self-correct core position progressing to able to complete knees to chest tuck from this position x 5 with good control.   FUNCTIONAL MOBILITY:  Bed mobility:supine <> sit I,with some discomfort.   Transfers:sit <> stand Iwith some discomfort.   Gait:I and WFL for household and community distances at least up to 3 mph for 6 minutes on 2.5% incline (as tested on treadmill). Reports limitations due to pain.   Objective measurements completed on examination: See above findings.  TREATMENT:  Denies latex allergy.  Therapeutic exercise:to centralize symptoms and improve ROM and strength required for successful completion of functional activities.  Treadmill levelup to2.70mh, nograde.For improved lower extremity mobility, muscular endurance, and weightbearing activity tolerance; and to induce the analgesic effect of aerobic exercise, stimulate improved joint nutrition, and prepare body structures and systems for following interventions. x6Minutes during subjective exam. - repeated cervical retraction in sitting without back support with self overpressureto extension and rotationto continueaddressing R UE pain/parestheia that waslimitingparticipation in exercises for lumbar pain. Demo good carry over.No shoulder pain prior or during.x15 -  Measurement/assessment of AROM for lumbar spine, BLE strength, functional testing (see above) to assess progress. - manually resisted isometric hip, knee, and ankle movements to improve strength and assess deficits.  -  Sit <> stand for speed (as many as possible) in 30 seconds to improve LE and posterior trunk strength and power for functional activities. From chair high plinth.  - air squats x 10 to chair height to improve LE and posterior trunk strength and power for functional activities.  -  Modified push ups from plank position, first attempted from tall plinth with cuing to improve plank position: able to complete 3 with semi-good form before arms too tired - difficulty maintaining trunk stability; then attempted hands on treadmill bars (higher than plinth) successfully x10 with cuing for 45 degrees shoulder abduction and plank position.  - back lunges with ipsilateral UE support x 3 attempts each side progressing to x 5 each side with no UE stepping over airex to place knee on airex. Patient with altered pattern of getting up showing weakness at bilateral glutes but able to complete without UE support with 1-2 failed attempts each side and long pauses between reps.  - front plank off theraball placed at proximal thighs to hands with focus on correction of back sag - patient able to correct with minimal cuing and hold for over 5 seconds followed by 5 reps of knees to chest and back from this position without losing balance to improve abdominal/core strength and control.  - hooklying single leg bridges x 10 each side with cuing for maximal hip extension (able to get to 75% of available range). To improve glute and posterior trunk strength for single leg and asymetrically loaded  functional activities.  - Education on HEP including progressions and instructions for long term maintenance and progressions if necessary.  - Education on diagnosis, prognosis, POC, anatomy and physiology of current condition.    HOME EXERCISE PROGRAM Access Code: [email or web address]  URL: [email or web address]  Date: 05/19/2019  Prepared by: Rosita Kea   Exercises   Cervical Retraction with Overpressure - 10-15 reps - 1 second hold - 4x daily   Supine Hamstring Curl on Swiss Ball - 1-3 sets - 10 reps   Plank with Legs on Swiss Ball and Knee Curls - 3 sets - 5 reps - 2-3x daily   Supine Hip Extension on Bench - 1-3 sets - 10-15 reps - 1 second hold  Access Code: [email or web address]  URL: [email or web address]  Date: 04/08/2019  Prepared by: Rosita Kea   Exercises   Squat with Chair Touch - 10-15 reps - 1 second hold - 3 Sets - 1x daily - 7x weekly   Bird Dog - 10-15 reps - 1 second hold - 3 Sets - 1x daily - 7x weekly   Dead Bug with Swiss Ball - 10-15 reps - 1 second hold - 3 Sets - 1x daily - 7x weekly   Side Plank on Knees - 10-15 reps - 1 second hold - 3 Sets - 1x daily - 3x weekly   Staggered Stance Single Arm Row with Anchored Resistance - 10-15 reps - 1 second hold - 3 Sets - 1x daily - 3x weekly   Seated Correct Posture   Walking - 30 min hold - 4x weekly   Standing Diagonal Chop - 3 sets - 10 reps - 1x daily   Reverse Chop with Resistance - 3 sets - 10 reps - 1x daily   Supine Bridge - 1-3 sets - 10 reps - 1-5 sec hold - 1x daily   Reverse Lunge - 1-3 sets - 10 reps   Push Up on Table - 1-3 sets - 5-10 reps - 1x daily   PT Education - 06/03/19 1813    Education Details  Exercise purpose/form. Self management techniques. Education on diagnosis, prognosis, POC, anatomy and physiology of current condition. HEP and progressions.    Person(s) Educated  Patient    Methods  Explanation;Demonstration;Tactile cues;Verbal cues    Comprehension  Verbalized understanding;Returned  demonstration;Verbal cues required;Tactile cues required;Need further instruction       PT Short Term Goals - 06/03/19 1814      PT SHORT TERM GOAL #1   Title  Be independent with home exercise  program completed at least 3 times per week for self-management of symptoms.    Baseline  Initial HEP provided at initial eval (11/04/2018);    Time  2    Period  Weeks    Status  Achieved    Target Date  11/19/18      PT SHORT TERM GOAL #2   Title  Reduce pain to 8/10 with functional activities to allow patient to complete valued functional tasks such as sit to stand with less difficulty.    Baseline  reports 10/10 at initial evaluation (11/04/2018); 9.5/10 (11/28/2018); 8/10 at night while trying to sleep (12/09/2018); 5/10 (06/03/2019);    Time  2    Period  Weeks    Status  Achieved    Target Date  12/12/18      PT SHORT TERM GOAL #3   Title  Improve lumbar AROM to 75% to allow patient to complete valued activities with less difficulty.     Baseline  50% with end range pain at initial evaluaiton (11/04/2018);     Time  2    Period  Weeks    Status  Achieved    Target Date  11/19/18        PT Long Term Goals - 06/03/19 1815      PT LONG TERM GOAL #1   Title  Be independent with a long-term home exercise program for self-management of symptoms.     Baseline  Initial HEP provided at initial eval (11/04/2018); Initial HEP and progressions have been provided but not final progressions (11/28/2018); Initial HEP and progressions have been provided but not final progressions (12/09/2018; 01/06/2019; 02/26/2019; 04/23/2019; 06/03/2019)    Time  6    Period  Weeks    Status  Partially Met    Target Date  07/15/19      PT LONG TERM GOAL #2   Title  Demonstrate improved FOTO score by 10 units to demonstrate improvement in overall condition and self-reported functional ability.     Baseline  Foto = 47 (11/04/2018); too early to re-measure (11/28/2018); not measured (12/09/2018); 44 (01/06/2019, 02/26/2019 - questionnaire is only asking one question, no longer valid, see new goal).     Time  6    Period  Weeks    Status  Deferred    Target Date  03/03/19      PT LONG TERM GOAL #3   Title   Complete community, work and/or recreational activities without limitation due to current condition.     Baseline  limited in daily activities and responsibilities, limited to 5# of lifting, disrupts sleep. 5# weight lift limit (11/04/2018); pt reports increased activty since starting PT, continues to have similar limitations to baseline (11/28/2018);; patient reports increased activity since strating PT, continues to have similar limitations to baseline but with slightly lower pain (12/09/2018); continues to have similar limitations but with less pain overall (01/06/2019); feels similar limitations but more better days than bad days now (02/26/2019).; continues to have difficulty picking up son (04/23/2019); continues to have difficulty picking up > 50# autistic son who throws tantrums, feels overall she is able to do more and manage her pain better (06/03/2019);    Time  6    Period  Weeks  Status  Partially Met    Target Date  07/15/19      PT LONG TERM GOAL #4   Title  Patient will increase BLE gross strength to 4+/5 as to improve functional strength for independent gait, increased standing tolerance and increased ADL ability.    Baseline  Hip: Abduction: R = 3+/5 painful , L = 4+/5 painful!. Hip Flexion: R = 3+/5, L = 4/5. Knee Flex: R = 4/5, L = 5/5. (11/04/2018); see objective exam (01/06/2019);  see objective exam (02/26/2019); did not measure formally (04/23/2019); BLE strength 5/5 except B hip extension painful and 3+/5 and left hip abduction is painful.    Time  6    Period  Weeks    Status  Partially Met    Target Date  07/15/19      PT LONG TERM GOAL #5   Title  Be able to squat to chair height with proper form without limitation due to current condition in order to improve ability to lift and complete transfers during usual activities.     Baseline  Difficulty with sit<>stand transfers due to pain but less than at initial eval (11/28/2018); improved sit <> stand transfers with reduced pain  compared to initial eval (12/09/2018);; able to complete partial squat and improved sit to stand but continued pain (01/06/2019);  able to quat to chair height with mild discomfort (02/26/2019; 06/03/2019);    Time  6    Period  Weeks    Status  Achieved    Target Date  04/09/19      PT LONG TERM GOAL #6   Title  Demonstrate improved modified Oswestry Disability Index (mODI) score to equal or less than 20% to demonstrate improvement in overall condition and self-reported functional ability.     Baseline  50% (02/26/2019); 42% (04/23/2019); 28% (06/03/2019);    Time  6    Period  Weeks    Status  Partially Met    Target Date  07/15/19      PT LONG TERM GOAL #7   Title  Patient will be able to complete 5 back lunges with knee to floor and light UE support with good form to improve her activity tolerance for activities requiring assymetrical loading such as picking up her son.     Baseline  Back lunge: excessive forward bend, unable to touch knee to ground, unsteady, requires unilateral UE support (04/23/2019); able to complete 5 reps without UE support with difficulty controlling descent demonstrating weakness.    Time  6    Period  Weeks    Status  Partially Met    Target Date  07/15/19            Plan - 06/03/19 1938    Clinical Impression Statement  Patient has attended 29 visits this episode of care and is continuing to make progress towards goals. She demonstrates excellent attendance of her appointments and participates in her HEP as prescribed. has met all short term goals and is making significant progress towards all long term goals. Her Modified Oswestry scale (MCID 10) has improved from from 42% at last progress assessment to 28% today demonstrating a clinically significant improvement in self-reported function. Subjectively, she reports improved maximal pain from 8/10 at last progress assessment to 5/10 today and she reports improved strength and ability to stay active with her  responsibilities and home tasks despite incomplete resolution of pain. She reports better pain control and strength.  Objectively, she demonstrates improved LE strength  during MMT and improved functional strength, power, and muscular endurance. She is now able to complete a lunge without UE support demonstrating improvement in her motor control, LE and core strength, and endurance enabling her to  ability to complete tasks that require asymetrical forces such as physically assisting her autistic child. She demonstrates improved lumbar ROM with less pain and improved ability to squat with less pain, improving her ability to complete housekeeping tasks and child care. Patient has made good progress but also continues to demonstrate significant deficits that could be addressed with continued physical therapy. She continues to have pain, difficulty with lumbar motor control during bending and lifting activities, impaired bilateral glute and trunk strength and coordination, and is lacking in functional strength and activity tolerance that leads to functional deficits including caring for her children, including prolonged sitting and standing, prolonged walking, bending, lifting, twisting, and sleeping. She particularly has difficulty lifting and physically managing her autistic son who weighs over 50# when he has tantrums. Patient would benefit from continued physical therapy to address remaining impairments and functional limitations to work towards stated goals and return to PLOF or maximal functional ability.    Personal Factors and Comorbidities  Time since onset of injury/illness/exacerbation    Examination-Activity Limitations  Sit;Transfers;Sleep;Bend;Lift;Squat;Locomotion Level;Stairs;Caring for Others;Stand;Carry    Examination-Participation Restrictions  Interpersonal Relationship;Yard Work;Cleaning;Laundry;Community Activity;Other;Driving;Meal Prep;Shop   caring for children   Rehab Potential  Good     Clinical Impairments Affecting Rehab Potential  (+) denies leg symptoms; motivated; complient with HEP  (-) lack of readily responsive pain    PT Frequency  2x / week    PT Duration  6 weeks    PT Treatment/Interventions  ADLs/Self Care Home Management;Moist Heat;Cryotherapy;Functional mobility training;Stair training;Therapeutic activities;Therapeutic exercise;Balance training;Neuromuscular re-education;Patient/family education;Manual techniques;Passive range of motion;Dry needling;Spinal Manipulations;Joint Manipulations;Other (comment)    PT Next Visit Plan  continue progressive functional and core strengthing in graded manner to improve tolerance    PT Home Exercise Plan  Medbridge  Access Code: [facility] and Access Code: [email or web address]     Consulted and Agree with Plan of Care  Patient       Patient will benefit from skilled therapeutic intervention in order to improve the following deficits and impairments:  Decreased endurance, Decreased mobility, Difficulty walking, Hypomobility, Increased muscle spasms, Decreased range of motion, Impaired perceived functional ability, Impaired tone, Obesity, Decreased activity tolerance, Decreased strength, Postural dysfunction, Pain, Impaired flexibility, Improper body mechanics, Decreased coordination  Visit Diagnosis: 1. Chronic bilateral low back pain without sciatica   2. Difficulty in walking, not elsewhere classified        Problem List Patient Active Problem List   Diagnosis Date Noted  . Menorrhagia with regular cycle 03/11/2018  . Microcytic anemia 02/25/2018  . Iron deficiency anemia due to chronic blood loss 02/18/2018    Everlean Alstrom. Graylon Good, PT, DPT 06/03/19, 7:48 PM  [facility] [address], [place], [address] Phone: [phone]   Fax:  [phone]  Name: TACI STERLING MRN: [id number] Date of Birth: 24-Jul-1986

## 2019-06-04 ENCOUNTER — Encounter: Payer: Medicaid Other | Admitting: Physical Therapy

## 2019-06-05 ENCOUNTER — Encounter: Payer: Medicaid Other | Admitting: Physical Therapy

## 2019-06-09 ENCOUNTER — Encounter: Payer: Medicaid Other | Admitting: Physical Therapy

## 2019-06-10 ENCOUNTER — Ambulatory Visit: Payer: Medicaid Other | Admitting: Physical Therapy

## 2019-06-10 ENCOUNTER — Encounter: Payer: Self-pay | Admitting: Physical Therapy

## 2019-06-10 ENCOUNTER — Other Ambulatory Visit: Payer: Self-pay

## 2019-06-10 ENCOUNTER — Inpatient Hospital Stay: Payer: Medicaid Other | Attending: Hematology and Oncology

## 2019-06-10 DIAGNOSIS — Z79899 Other long term (current) drug therapy: Secondary | ICD-10-CM | POA: Insufficient documentation

## 2019-06-10 DIAGNOSIS — N920 Excessive and frequent menstruation with regular cycle: Secondary | ICD-10-CM | POA: Diagnosis not present

## 2019-06-10 DIAGNOSIS — R262 Difficulty in walking, not elsewhere classified: Secondary | ICD-10-CM

## 2019-06-10 DIAGNOSIS — D509 Iron deficiency anemia, unspecified: Secondary | ICD-10-CM

## 2019-06-10 DIAGNOSIS — D500 Iron deficiency anemia secondary to blood loss (chronic): Secondary | ICD-10-CM | POA: Diagnosis present

## 2019-06-10 DIAGNOSIS — M545 Low back pain, unspecified: Secondary | ICD-10-CM

## 2019-06-10 DIAGNOSIS — G8929 Other chronic pain: Secondary | ICD-10-CM

## 2019-06-10 LAB — IRON AND TIBC
Iron: 87 ug/dL (ref 28–170)
Saturation Ratios: 27 % (ref 10.4–31.8)
TIBC: 326 ug/dL (ref 250–450)
UIBC: 239 ug/dL

## 2019-06-10 LAB — CBC WITH DIFFERENTIAL/PLATELET
Abs Immature Granulocytes: 0.02 10*3/uL (ref 0.00–0.07)
Basophils Absolute: 0.1 10*3/uL (ref 0.0–0.1)
Basophils Relative: 1 %
Eosinophils Absolute: 0.2 10*3/uL (ref 0.0–0.5)
Eosinophils Relative: 3 %
HCT: 42.3 % (ref 36.0–46.0)
Hemoglobin: 14.1 g/dL (ref 12.0–15.0)
Immature Granulocytes: 0 %
Lymphocytes Relative: 24 %
Lymphs Abs: 1.6 10*3/uL (ref 0.7–4.0)
MCH: 29.9 pg (ref 26.0–34.0)
MCHC: 33.3 g/dL (ref 30.0–36.0)
MCV: 89.6 fL (ref 80.0–100.0)
Monocytes Absolute: 0.4 10*3/uL (ref 0.1–1.0)
Monocytes Relative: 6 %
Neutro Abs: 4.3 10*3/uL (ref 1.7–7.7)
Neutrophils Relative %: 66 %
Platelets: 217 10*3/uL (ref 150–400)
RBC: 4.72 MIL/uL (ref 3.87–5.11)
RDW: 12.7 % (ref 11.5–15.5)
WBC: 6.5 10*3/uL (ref 4.0–10.5)
nRBC: 0 % (ref 0.0–0.2)

## 2019-06-10 LAB — FERRITIN: Ferritin: 35 ng/mL (ref 11–307)

## 2019-06-10 NOTE — Therapy (Signed)
[facility] [address] [place], [address] Phone: [phone]   Fax:  [phone]  Physical Therapy Treatment  Patient Details  Name: Cathy Trevino MRN: [id number] Date of Birth: 25-Apr-1986 Referring Provider (PT): Margurite Auerbach, NP-C   Encounter Date: 06/10/2019  PT End of Session - 06/10/19 1138    Visit Number  30    Number of Visits  35    Date for PT Re-Evaluation  07/15/19    Authorization Type  Medicaid reporting period from 06/03/2019; auth through 07/21/2019    Authorization Time Period  Current Certification period: 06/03/2019 - 6/59/9357 (last PN/Re-Cert 0/17/7939)    Authorization - Visit Number  1    Authorization - Number of Visits  6    PT Start Time  1130    PT Stop Time  1215    PT Time Calculation (min)  45 min    Activity Tolerance  Patient limited by fatigue    Behavior During Therapy  [facility] for tasks assessed/performed       Past Medical History:  Diagnosis Date  . Anemia   . Anxiety   . Asthma   . Depression   . Menorrhagia     Past Surgical History:  Procedure Laterality Date  . CESAREAN SECTION  2012  . TUBAL LIGATION      There were no vitals filed for this visit.  Subjective Assessment - 06/10/19 1133    Subjective  Patient reports her pain is constantly at a 5/10 at mid lumbar spine with intermittant tingling in bilateral anterior thighs. She states the tingling started on Saturday and gets worse when she bends over and lifts. She just got an allergy shot and labwork done this morning. She got her B12 shot last year. Her pain has been constant since last treatment session. She has pushed through the pain to complete her exercies since last treatment session. The exercises do not make her pain better or worse.    Pertinent History  Patient is a 33 y.o. female who presents to outpatient physical therapy with a referral for chronic low back pain. This patient's chief  complaints consist of pain, leading to the following functional deficits: difficulty with all activities that require movement or motion including lifting, bending, prolonged positions, sleeping. Relevant past medical history and comorbidities include "musculoskeletal issue around heart" told to take tylenol when needed (sees cardiologist when he feels is necessary, last was July 2018), obesity, previous episodes of low back pain, history of depression and anxiety, anemia. Imaging: From MRI report dated 07/15/2018: "1. 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 2. Diffusely decreased T1 signal throughout the visualized bone marrow, nonspecific, but suspected to be related to patient's history of anemia. 3. Otherwise normal MRI of the lumbar spine."    Limitations  Sitting;Walking;Lifting;House hold activities;Standing    How long can you sit comfortably?  ~ 20 mins on a surface with a cushon, less than that on a wooden chair.    How long can you stand comfortably?  about 15 minutes    How long can you walk comfortably?  10 min    Diagnostic tests  MRI report dated 07/15/2018: "1. 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 2. Diffusely decreased T1 signal throughout the visualized bone marrow, nonspecific, but  suspected to be related to patient's history of anemia. 3. Otherwise normal MRI of the lumbar spine."    Patient Stated Goals  Get rid of the pain so she can function better.     Currently in Pain?  Yes    Pain Score  5     Pain Location  Back    Pain Orientation  Posterior;Mid    Pain Descriptors / Indicators  Sharp;Tingling    Pain Type  Chronic pain    Pain Radiating Towards  bilateral anterior thighs.    Pain Onset  More than a month ago       TREATMENT:  Denies latex  allergy.  Therapeutic exercise:to centralize symptoms and improve ROM and strength required for successful completion of functional activities.  Treadmill levelup to2.41mh,nograde.For improved lower extremity mobility, muscular endurance, and weightbearing activity tolerance; and to induce the analgesic effect of aerobic exercise, stimulate improved joint nutrition, and prepare body structures and systems for following interventions. x6Minutes during subjective exam. - back lungewithipsilateral UE support using TRX.x10sets each side.Pt demo improved control of descent compared to last week without need for airex under knee this session. Comes to complete rest in kneeling prior to standing up.Excessive forward trunk bend improved with practice. Struggles to maintain tension throughout body and stable core. To improve trunk, hip, LE, and postural control during asymetrical loading to strengthen patterns found challenging/painful in daily activities. - prone knee to chest from plank position with theraball under distal thighs. 2x5 plus additional failed attempts and rest with pauses for improved cuing and demonstration. To improve dynamic trunk stability especially of abdominal muscles, to reduce sagging in plank position noted with plank on ball pull through.  - attempted front plank off theraball placed at proximal thighs to hands with focus on correction of back sag while trying to take hand off ground to touch cone or pull 2# dumbbell across front of body. Patient completed 2 reps of several seconds in plank position but was unable to move UE without losing plank position. to improve abdominal/core strength and control.  - step up to 12 inch step with contralateral knee high while maintaining balance, to improve glute activation and core stability 2x10 each side. Second set holding black theraband in both hands maintaining tension pulling apart at 90 degrees shoulder flexion with elbows  extended, to improve tension throughout body to improve core activation. Cuing for form.  - single leg hinge reaching both hands front with flat back to touch treatmill railing while kicking one foot back to touch table positioned just within reach to cue body to move as in a single leg deadlift to improve core and single leg/hip strength. Added holding black theraband in both hands maintaining tension pulling apart in front of body moving from chest to elbows extended toward the treadmill bars to improve tension throughout body to improve core activation. Second set completed with small balance dome next to support leg to rest moving leg on to improve return to standing position and single leg balance and core activation. Patient struggled but able to perform good reps about 25% of the time and other reps acceptable for someone learning and working towards performing exercises well.  - Education on HEP including progressions and instructions for long term maintenance and progressions if necessary.  - Education on diagnosis, prognosis, POC, anatomy and physiology of current condition.   HOME EXERCISE PROGRAM Access Code: [email or web address] URL: [email or web address]  Date: 05/19/2019  Prepared by: SRosita Kea  Exercises  Cervical Retraction with Overpressure - 10-15 reps - 1 second hold - 4x daily   Supine Hamstring Curl on Swiss Ball - 1-3 sets - 10 reps   Plank with Legs on Swiss Ball and Knee Curls - 3 sets - 5 reps - 2-3x daily   Supine Hip Extension on Bench - 1-3 sets - 10-15 reps - 1 second hold  Access Code: [email or web address]  URL: [email or web address]  Date: 04/08/2019  Prepared by: Rosita Kea   Exercises   Squat with Chair Touch - 10-15 reps - 1 second hold - 3 Sets - 1x daily - 7x weekly   Bird Dog - 10-15 reps - 1 second hold - 3 Sets - 1x daily - 7x weekly   Dead Bug with Swiss Ball - 10-15 reps - 1 second hold - 3 Sets - 1x daily - 7x weekly   Side  Plank on Knees - 10-15 reps - 1 second hold - 3 Sets - 1x daily - 3x weekly   Staggered Stance Single Arm Row with Anchored Resistance - 10-15 reps - 1 second hold - 3 Sets - 1x daily - 3x weekly   Seated Correct Posture   Walking - 30 min hold - 4x weekly   Standing Diagonal Chop - 3 sets - 10 reps - 1x daily   Reverse Chop with Resistance - 3 sets - 10 reps - 1x daily   Supine Bridge - 1-3 sets - 10 reps - 1-5 sec hold - 1x daily   Reverse Lunge - 1-3 sets - 10 reps   Push Up on Table - 1-3 sets - 5-10 reps - 1x daily    PT Education - 06/10/19 1138    Education Details  Exercise purpose/form. Self management techniques. Education on diagnosis, prognosis, POC, anatomy and physiology of current condition    Person(s) Educated  Patient    Methods  Explanation;Demonstration;Tactile cues;Verbal cues    Comprehension  Verbalized understanding;Returned demonstration;Verbal cues required;Tactile cues required       PT Short Term Goals - 06/03/19 1814      PT SHORT TERM GOAL #1   Title  Be independent with home exercise program completed at least 3 times per week for self-management of symptoms.    Baseline  Initial HEP provided at initial eval (11/04/2018);    Time  2    Period  Weeks    Status  Achieved    Target Date  11/19/18      PT SHORT TERM GOAL #2   Title  Reduce pain to 8/10 with functional activities to allow patient to complete valued functional tasks such as sit to stand with less difficulty.    Baseline  reports 10/10 at initial evaluation (11/04/2018); 9.5/10 (11/28/2018); 8/10 at night while trying to sleep (12/09/2018); 5/10 (06/03/2019);    Time  2    Period  Weeks    Status  Achieved    Target Date  12/12/18      PT SHORT TERM GOAL #3   Title  Improve lumbar AROM to 75% to allow patient to complete valued activities with less difficulty.     Baseline  50% with end range pain at initial evaluaiton (11/04/2018);     Time  2    Period  Weeks    Status   Achieved    Target Date  11/19/18        PT Long Term Goals - 06/03/19 1815      PT LONG TERM GOAL #  1   Title  Be independent with a long-term home exercise program for self-management of symptoms.     Baseline  Initial HEP provided at initial eval (11/04/2018); Initial HEP and progressions have been provided but not final progressions (11/28/2018); Initial HEP and progressions have been provided but not final progressions (12/09/2018; 01/06/2019; 02/26/2019; 04/23/2019; 06/03/2019)    Time  6    Period  Weeks    Status  Partially Met    Target Date  07/15/19      PT LONG TERM GOAL #2   Title  Demonstrate improved FOTO score by 10 units to demonstrate improvement in overall condition and self-reported functional ability.     Baseline  Foto = 47 (11/04/2018); too early to re-measure (11/28/2018); not measured (12/09/2018); 44 (01/06/2019, 02/26/2019 - questionnaire is only asking one question, no longer valid, see new goal).     Time  6    Period  Weeks    Status  Deferred    Target Date  03/03/19      PT LONG TERM GOAL #3   Title  Complete community, work and/or recreational activities without limitation due to current condition.     Baseline  limited in daily activities and responsibilities, limited to 5# of lifting, disrupts sleep. 5# weight lift limit (11/04/2018); pt reports increased activty since starting PT, continues to have similar limitations to baseline (11/28/2018);; patient reports increased activity since strating PT, continues to have similar limitations to baseline but with slightly lower pain (12/09/2018); continues to have similar limitations but with less pain overall (01/06/2019); feels similar limitations but more better days than bad days now (02/26/2019).; continues to have difficulty picking up son (04/23/2019); continues to have difficulty picking up > 50# autistic son who throws tantrums, feels overall she is able to do more and manage her pain better (06/03/2019);    Time  6     Period  Weeks    Status  Partially Met    Target Date  07/15/19      PT LONG TERM GOAL #4   Title  Patient will increase BLE gross strength to 4+/5 as to improve functional strength for independent gait, increased standing tolerance and increased ADL ability.    Baseline  Hip: Abduction: R = 3+/5 painful , L = 4+/5 painful!. Hip Flexion: R = 3+/5, L = 4/5. Knee Flex: R = 4/5, L = 5/5. (11/04/2018); see objective exam (01/06/2019);  see objective exam (02/26/2019); did not measure formally (04/23/2019); BLE strength 5/5 except B hip extension painful and 3+/5 and left hip abduction is painful.    Time  6    Period  Weeks    Status  Partially Met    Target Date  07/15/19      PT LONG TERM GOAL #5   Title  Be able to squat to chair height with proper form without limitation due to current condition in order to improve ability to lift and complete transfers during usual activities.     Baseline  Difficulty with sit<>stand transfers due to pain but less than at initial eval (11/28/2018); improved sit <> stand transfers with reduced pain compared to initial eval (12/09/2018);; able to complete partial squat and improved sit to stand but continued pain (01/06/2019);  able to quat to chair height with mild discomfort (02/26/2019; 06/03/2019);    Time  6    Period  Weeks    Status  Achieved    Target Date  04/09/19  PT LONG TERM GOAL #6   Title  Demonstrate improved modified Oswestry Disability Index (mODI) score to equal or less than 20% to demonstrate improvement in overall condition and self-reported functional ability.     Baseline  50% (02/26/2019); 42% (04/23/2019); 28% (06/03/2019);    Time  6    Period  Weeks    Status  Partially Met    Target Date  07/15/19      PT LONG TERM GOAL #7   Title  Patient will be able to complete 5 back lunges with knee to floor and light UE support with good form to improve her activity tolerance for activities requiring assymetrical loading such as picking up  her son.     Baseline  Back lunge: excessive forward bend, unable to touch knee to ground, unsteady, requires unilateral UE support (04/23/2019); able to complete 5 reps without UE support with difficulty controlling descent demonstrating weakness.    Time  6    Period  Weeks    Status  Partially Met    Target Date  07/15/19            Plan - 06/10/19 1545    Clinical Impression Statement  Patient tolerated treatment well overall but was limited by pain that was worse with unsupported lunges but resolved to baseline with rest. She also felt paresthesia in bilateral anterior thighs following the theraball exercise that gradually improved and got closer to midline by end of visit. Initially she felt it more on R over L but by end of session it was equal. Patient was determined to participate fully despite elevated pain today and reports she felt is was going to hurt regardless of what she did and wanted to keep working on improving her strength. Patient continues to demonstrate improving form but lacks full trunk and core control. Addition of band held tight in UEs improved body stability. Pt required multimodal cuing for proper technique and to facilitate improved neuromuscular control, strength, range of motion, and functional ability resulting in improved performance and form. Patient would benefit from continued physical therapy to address remaining impairments and functional limitations to work towards stated goals and return to PLOF or maximal functional independence.    Personal Factors and Comorbidities  Time since onset of injury/illness/exacerbation    Examination-Activity Limitations  Sit;Transfers;Sleep;Bend;Lift;Squat;Locomotion Level;Stairs;Caring for Others;Stand;Carry    Examination-Participation Restrictions  Interpersonal Relationship;Yard Work;Cleaning;Laundry;Community Activity;Other;Driving;Meal Prep;Shop   caring for children   Rehab Potential  Good    Clinical Impairments  Affecting Rehab Potential  (+) denies leg symptoms; motivated; complient with HEP  (-) lack of readily responsive pain    PT Frequency  2x / week    PT Duration  6 weeks    PT Treatment/Interventions  ADLs/Self Care Home Management;Moist Heat;Cryotherapy;Functional mobility training;Stair training;Therapeutic activities;Therapeutic exercise;Balance training;Neuromuscular re-education;Patient/family education;Manual techniques;Passive range of motion;Dry needling;Spinal Manipulations;Joint Manipulations;Other (comment)    PT Next Visit Plan  continue progressive functional and core strengthing in graded manner to improve tolerance. Consider 1x a week in clinic with 1x a week indpendent exerercise session to help transition to more independence.    PT Home Exercise Plan  Medbridge  Access Code: [facility] and Access Code: [email or web address]     Consulted and Agree with Plan of Care  Patient       Patient will benefit from skilled therapeutic intervention in order to improve the following deficits and impairments:  Decreased endurance, Decreased mobility, Difficulty walking, Hypomobility, Increased muscle spasms, Decreased range of motion, Impaired  perceived functional ability, Impaired tone, Obesity, Decreased activity tolerance, Decreased strength, Postural dysfunction, Pain, Impaired flexibility, Improper body mechanics, Decreased coordination  Visit Diagnosis: 1. Chronic bilateral low back pain without sciatica   2. Difficulty in walking, not elsewhere classified        Problem List Patient Active Problem List   Diagnosis Date Noted  . Menorrhagia with regular cycle 03/11/2018  . Microcytic anemia 02/25/2018  . Iron deficiency anemia due to chronic blood loss 02/18/2018    Everlean Alstrom. Graylon Good, PT, DPT 06/10/19, 3:47 PM  [facility] [address] [place], [address] Phone: [phone]   Fax:  [phone]  Name: Cathy Trevino MRN: [id number] Date of Birth: 29-Dec-1985

## 2019-06-11 ENCOUNTER — Encounter: Payer: Medicaid Other | Admitting: Physical Therapy

## 2019-06-12 ENCOUNTER — Ambulatory Visit: Payer: Medicaid Other | Admitting: Hematology and Oncology

## 2019-06-12 ENCOUNTER — Ambulatory Visit: Payer: Medicaid Other

## 2019-06-16 ENCOUNTER — Ambulatory Visit: Payer: Medicaid Other | Admitting: Physical Therapy

## 2019-06-16 ENCOUNTER — Encounter: Payer: Self-pay | Admitting: Physical Therapy

## 2019-06-16 ENCOUNTER — Other Ambulatory Visit: Payer: Self-pay

## 2019-06-16 DIAGNOSIS — R262 Difficulty in walking, not elsewhere classified: Secondary | ICD-10-CM

## 2019-06-16 DIAGNOSIS — G8929 Other chronic pain: Secondary | ICD-10-CM

## 2019-06-16 DIAGNOSIS — M545 Low back pain: Secondary | ICD-10-CM | POA: Diagnosis not present

## 2019-06-16 NOTE — Therapy (Signed)
[facility] [address] [place], [address] Phone: [phone]   Fax:  [phone]  Physical Therapy Treatment  Patient Details  Name: Cathy Trevino MRN: [id number] Date of Birth: 1986-10-30 Referring Provider (PT): Margurite Auerbach, NP-C   Encounter Date: 06/16/2019  PT End of Session - 06/16/19 1716    Visit Number  31    Number of Visits  35    Date for PT Re-Evaluation  07/15/19    Authorization Type  Medicaid reporting period from 06/03/2019; auth through 07/21/2019    Authorization Time Period  Current Certification period: 06/03/2019 - 8/56/3149 (last PN/Re-Cert 06/18/6377)    Authorization - Visit Number  2    Authorization - Number of Visits  6    PT Start Time  1030    PT Stop Time  1115    PT Time Calculation (min)  45 min    Activity Tolerance  Patient limited by fatigue;Patient limited by pain    Behavior During Therapy  [facility] for tasks assessed/performed       Past Medical History:  Diagnosis Date  . Anemia   . Anxiety   . Asthma   . Depression   . Menorrhagia     Past Surgical History:  Procedure Laterality Date  . CESAREAN SECTION  2012  . TUBAL LIGATION      There were no vitals filed for this visit.  Subjective Assessment - 06/16/19 1025    Subjective  Patient reports her pain continues to be 5/10 at the mid lumbar spine. She reports it did not increase or decrease significantly following last treatment session and does not seem to change in response to lifting her son or other activities/exercises at home. She report intermittant paresthesia in bilateral anterior thights and denies any symptoms below the knees or at the back of the legs. She reports the paresthesia is not present upon start of visit. She states her ferritin was okay and she did not intervention for low ferritin at this time. She reports her pain makes it more difficult to do her HEP but she continues to do some of the  exercises daily as best she can. She wants to know if her HEP was updated as discussed last treatment session.    Pertinent History  Patient is a 33 y.o. female who presents to outpatient physical therapy with a referral for chronic low back pain. This patient's chief complaints consist of pain, leading to the following functional deficits: difficulty with all activities that require movement or motion including lifting, bending, prolonged positions, sleeping. Relevant past medical history and comorbidities include "musculoskeletal issue around heart" told to take tylenol when needed (sees cardiologist when he feels is necessary, last was July 2018), obesity, previous episodes of low back pain, history of depression and anxiety, anemia. Imaging: From MRI report dated 07/15/2018: "1. 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 2. Diffusely decreased T1 signal throughout the visualized bone marrow, nonspecific, but suspected to be related to patient's history of anemia. 3. Otherwise normal MRI of the lumbar spine."    Limitations  Sitting;Walking;Lifting;House hold activities;Standing    How long can you sit comfortably?  ~ 20 mins on a surface with a cushon, less than that on a wooden chair.    How long can you stand comfortably?  about 15 minutes    How long can you  walk comfortably?  10 min    Diagnostic tests  MRI report dated 07/15/2018: "1. 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 2. Diffusely decreased T1 signal throughout the visualized bone marrow, nonspecific, but suspected to be related to patient's history of anemia. 3. Otherwise normal MRI of the lumbar spine."    Patient Stated Goals  Get rid of the pain so she can function better.     Currently in Pain?  Yes    Pain Score  5     Pain Location   Back    Pain Orientation  Mid;Right;Posterior    Pain Descriptors / Indicators  Sharp    Pain Type  Chronic pain    Pain Onset  More than a month ago    Pain Frequency  Constant      TREATMENT:  Denies latex allergy.  Therapeutic exercise:to centralize symptoms and improve ROM and strength required for successful completion of functional activities.  Treadmill levelup to62mh,5% grade.For improved lower extremity mobility, muscular endurance, and weightbearing activity tolerance; and to induce the analgesic effect of aerobic exercise, stimulate improved joint nutrition, and prepare body structures and systems for following interventions. x6Minutes during subjective exam.  - seated lat pull down isolating scapular motion from arm motion. To provide light distraction force on spine while strengthening back muscles. 2x10 - back lungewithipsilateral UE support using TRX.x10sets each side.Pt demo improvedcontrol of descent compared to last week without need for airex under knee this session.Comes to complete rest in kneeling prior to standing up. Struggles to maintain tension throughout body and stable core. To improve trunk, hip, LE, and postural control during asymetrical loading to strengthen patterns found challenging/painful in daily activities. - single leg hinge reaching both hands front with flat back to touch treatmill railing while kicking one foot back to touch table positioned just within reach to cue body to move as in a single leg deadlift to improve core and single leg/hip strength. Added holding black theraband in both hands maintaining tension pulling apart in front of body moving from chest to elbows extended toward the treadmill bars to improve tension throughout body to improve core activation. Second set completed with small balance dome next to support leg to rest moving leg on to improve return to standing position and single leg balance and core activation.  Patient struggled but able to perform good reps about 25% of the time and other reps acceptable for someone learning and working towards performing exercises well.  - step up to 12 inch step with contralateral knee high while maintaining balance, to improve glute activation and core stability x10 each side. Second set holding black theraband in both hands maintaining tension pulling apart at 90 degrees shoulder flexion with elbows extended, to improve tension throughout body to improve core activation. Cuing for form.  -Education [place] and instructions for long term maintenance and progressions if necessary.  -Education on diagnosis, prognosis, POC, anatomy and physiology of current condition.  Manual therapy: to reduce pain and tissue tension, improve range of motion, neuromodulation, in order to promote improved ability to complete functional activities. - STM to lumbar paraspinals and surrounding tissue. Very light with attempt to decrease muscular tension. Discontinued when very tender at  R L4 region with spike in pain to 6.  - CPAs along lumbar spine and lower thoracic spine grade I-II to decrease pain. Very tender at L3-L5 R side.  -  UPAs R and L at L3-L5 grade I-II for pain relief. Tender on R side with R and L UPA, symptoms higher with UPA to R.  Discontinued manual due to sharp increase in R sided symptoms (to 6/10) that caused patient's eyes to water and cry. She requested "a minute" to calm down and it went back to 5/10. She found it most painful with pressure on R paraspinals and transverse processes near L4.  HOME EXERCISE PROGRAM Access Code: [phone] (on hold for now, more current) URL: [email or web address]  Date: 05/19/2019  Prepared by: Rosita Kea   Access Code: [facility] (on hold for now).  URL: [email or web address]  Date: 04/08/2019  Prepared by: Rosita Kea   Access Code: [email or web address] (CURRENT) URL:  [email or web address]  Date: 06/16/2019  Prepared by: Rosita Kea   Program Notes  Complete entire program 2-3 times per week.   Exercises  Glute Bridge on [place] - 2-3 sets - 10-15 reps - 1 second hold  Supine Bridge with Heels on Swiss Ball and Knees Bent - 2-3 sets - 10-15 reps - 1 second hold  Single Leg Bridge - 2-3 sets - 10-15 reps - 1 second hold  Mini Pike on [employer] - 2-3 sets - 5-10 reps  Runner's Step up with Arms Forward - 2-3 sets - 10-15 reps - 1 second hold  The Diver - 2-3 sets - 10-15 reps  Assisted Lunge with TRX - 2-3 sets - 10-15 reps     PT Education - 06/16/19 1716    Education Details  Exercise purpose/form. Self management techniques. Education on diagnosis, prognosis, POC, anatomy and physiology of current condition. HEP and progressions.    Person(s) Educated  Patient    Methods  Explanation;Demonstration;Tactile cues;Verbal cues;Handout    Comprehension  Verbalized understanding;Returned demonstration;Verbal cues required;Tactile cues required       PT Short Term Goals - 06/03/19 1814      PT SHORT TERM GOAL #1   Title  Be independent with home exercise program completed at least 3 times per week for self-management of symptoms.    Baseline  Initial HEP provided at initial eval (11/04/2018);    Time  2    Period  Weeks    Status  Achieved    Target Date  11/19/18      PT SHORT TERM GOAL #2   Title  Reduce pain to 8/10 with functional activities to allow patient to complete valued functional tasks such as sit to stand with less difficulty.    Baseline  reports 10/10 at initial evaluation (11/04/2018); 9.5/10 (11/28/2018); 8/10 at night while trying to sleep (12/09/2018); 5/10 (06/03/2019);    Time  2    Period  Weeks    Status  Achieved    Target Date  12/12/18      PT SHORT TERM GOAL #3   Title  Improve lumbar AROM to 75% to allow patient to complete valued activities with less difficulty.     Baseline  50% with end range pain  at initial evaluaiton (11/04/2018);     Time  2    Period  Weeks    Status  Achieved    Target Date  11/19/18        PT Long Term Goals - 06/03/19 1815      PT LONG TERM GOAL #1   Title  Be independent with a long-term home exercise program for self-management of symptoms.     Baseline  Initial HEP provided at initial eval (11/04/2018);  Initial HEP and progressions have been provided but not final progressions (11/28/2018); Initial HEP and progressions have been provided but not final progressions (12/09/2018; 01/06/2019; 02/26/2019; 04/23/2019; 06/03/2019)    Time  6    Period  Weeks    Status  Partially Met    Target Date  07/15/19      PT LONG TERM GOAL #2   Title  Demonstrate improved FOTO score by 10 units to demonstrate improvement in overall condition and self-reported functional ability.     Baseline  Foto = 47 (11/04/2018); too early to re-measure (11/28/2018); not measured (12/09/2018); 44 (01/06/2019, 02/26/2019 - questionnaire is only asking one question, no longer valid, see new goal).     Time  6    Period  Weeks    Status  Deferred    Target Date  03/03/19      PT LONG TERM GOAL #3   Title  Complete community, work and/or recreational activities without limitation due to current condition.     Baseline  limited in daily activities and responsibilities, limited to 5# of lifting, disrupts sleep. 5# weight lift limit (11/04/2018); pt reports increased activty since starting PT, continues to have similar limitations to baseline (11/28/2018);; patient reports increased activity since strating PT, continues to have similar limitations to baseline but with slightly lower pain (12/09/2018); continues to have similar limitations but with less pain overall (01/06/2019); feels similar limitations but more better days than bad days now (02/26/2019).; continues to have difficulty picking up son (04/23/2019); continues to have difficulty picking up > 50# autistic son who throws tantrums, feels  overall she is able to do more and manage her pain better (06/03/2019);    Time  6    Period  Weeks    Status  Partially Met    Target Date  07/15/19      PT LONG TERM GOAL #4   Title  Patient will increase BLE gross strength to 4+/5 as to improve functional strength for independent gait, increased standing tolerance and increased ADL ability.    Baseline  Hip: Abduction: R = 3+/5 painful , L = 4+/5 painful!. Hip Flexion: R = 3+/5, L = 4/5. Knee Flex: R = 4/5, L = 5/5. (11/04/2018); see objective exam (01/06/2019);  see objective exam (02/26/2019); did not measure formally (04/23/2019); BLE strength 5/5 except B hip extension painful and 3+/5 and left hip abduction is painful.    Time  6    Period  Weeks    Status  Partially Met    Target Date  07/15/19      PT LONG TERM GOAL #5   Title  Be able to squat to chair height with proper form without limitation due to current condition in order to improve ability to lift and complete transfers during usual activities.     Baseline  Difficulty with sit<>stand transfers due to pain but less than at initial eval (11/28/2018); improved sit <> stand transfers with reduced pain compared to initial eval (12/09/2018);; able to complete partial squat and improved sit to stand but continued pain (01/06/2019);  able to quat to chair height with mild discomfort (02/26/2019; 06/03/2019);    Time  6    Period  Weeks    Status  Achieved    Target Date  04/09/19      PT LONG TERM GOAL #6   Title  Demonstrate improved modified Oswestry Disability Index (mODI) score to equal or less than 20% to demonstrate improvement in  overall condition and self-reported functional ability.     Baseline  50% (02/26/2019); 42% (04/23/2019); 28% (06/03/2019);    Time  6    Period  Weeks    Status  Partially Met    Target Date  07/15/19      PT LONG TERM GOAL #7   Title  Patient will be able to complete 5 back lunges with knee to floor and light UE support with good form to improve her  activity tolerance for activities requiring assymetrical loading such as picking up her son.     Baseline  Back lunge: excessive forward bend, unable to touch knee to ground, unsteady, requires unilateral UE support (04/23/2019); able to complete 5 reps without UE support with difficulty controlling descent demonstrating weakness.    Time  6    Period  Weeks    Status  Partially Met    Target Date  07/15/19            Plan - 06/16/19 1740    Clinical Impression Statement  Patient tolerated treatment fair and was able to complete all exercises without lasting increase or decrease in pain. Due to her increased pain levels and movement of pain to the lower lumbar spine, manual therapy was attempted to help relieve pain with poor response resulting in sharp temporary increase in pain to 6/10 that brought tears to patient's eyes. She is most sensitive near L4 on the R soft tissue and when joints there are mobilized. She had no peripheralization into the back of the legs or feet. She did feel a bit of tingling in her quads with step ups. She was motivated to continue completing exercises despite pain. She continues to struggle with motor control and maintaining body tension with complex multi-joint activities. She required many attempts at hip hinging activity but grew progressively better at it with repetitions. Continues to demonstrate poor hip and core control. Provided patient with updated HEP and she may benefit from reviewing next session. Goal is for her to intermix unsupervised sessions independently at home with in-clinic visits to be able to reduce in- clinic visits 1x per week to preserve her authorized sessions and help her transition to a more long term program. Pt required multimodal cuing for proper technique and to facilitate improved neuromuscular control, strength, range of motion, and functional ability resulting in improved performance and form. Patient would benefit from continued  physical therapy to address remaining impairments and functional limitations to work towards stated goals and return to PLOF or maximal functional independence.    Personal Factors and Comorbidities  Time since onset of injury/illness/exacerbation    Examination-Activity Limitations  Sit;Transfers;Sleep;Bend;Lift;Squat;Locomotion Level;Stairs;Caring for Others;Stand;Carry    Examination-Participation Restrictions  Interpersonal Relationship;Yard Work;Cleaning;Laundry;Community Activity;Other;Driving;Meal Prep;Shop   caring for children   Rehab Potential  Good    Clinical Impairments Affecting Rehab Potential  (+) denies leg symptoms; motivated; complient with HEP  (-) lack of readily responsive pain    PT Frequency  2x / week    PT Duration  6 weeks    PT Treatment/Interventions  ADLs/Self Care Home Management;Moist Heat;Cryotherapy;Functional mobility training;Stair training;Therapeutic activities;Therapeutic exercise;Balance training;Neuromuscular re-education;Patient/family education;Manual techniques;Passive range of motion;Dry needling;Spinal Manipulations;Joint Manipulations;Other (comment)    PT Next Visit Plan  continue progressive functional and core strengthing in graded manner to improve tolerance. Consider 1x a week in clinic with 1x a week indpendent exerercise session to help transition to more independence.    PT Home Exercise Plan  Medbridge  Access Code:  [facility] and Access Code: [email or web address]     Consulted and Agree with Plan of Care  Patient       Patient will benefit from skilled therapeutic intervention in order to improve the following deficits and impairments:  Decreased endurance, Decreased mobility, Difficulty walking, Hypomobility, Increased muscle spasms, Decreased range of motion, Impaired perceived functional ability, Impaired tone, Obesity, Decreased activity tolerance, Decreased strength, Postural dysfunction, Pain, Impaired flexibility, Improper body mechanics, Decreased  coordination  Visit Diagnosis: 1. Chronic bilateral low back pain without sciatica   2. Difficulty in walking, not elsewhere classified        Problem List Patient Active Problem List   Diagnosis Date Noted  . Menorrhagia with regular cycle 03/11/2018  . Microcytic anemia 02/25/2018  . Iron deficiency anemia due to chronic blood loss 02/18/2018    Everlean Alstrom. Graylon Good, PT, DPT 06/16/19, 5:40 PM  [facility] [address] [place], [address] Phone: [phone]   Fax:  [phone]  Name: PAMMY VESEY MRN: [id number] Date of Birth: [birth year]

## 2019-06-19 ENCOUNTER — Ambulatory Visit: Payer: Medicaid Other | Admitting: Physical Therapy

## 2019-06-19 ENCOUNTER — Other Ambulatory Visit: Payer: Self-pay

## 2019-06-22 NOTE — Progress Notes (Signed)
[facility]  [address], [address] [place], [place] [address] Phone: [phone]  Fax: [phone]   Telemedicine Office Visit:  06/23/2019  Referring physician: Fayrene HelperBoswell, Chelsa H, NP  I connected with Cathy BombardJennifer M Delorey on 06/23/2019 at 9:52 AM by videoconferencing and verified that I was speaking with the correct person using 2 identifiers.  The patient was at home.  I discussed the limitations, risk, security and privacy concerns of performing an evaluation and management service by videoconferencing and the availability of in person appointments.  I also discussed with the patient that there may be a patient responsible charge related to this service.  The patient expressed understanding and agreed to proceed.   Chief Complaint: Cathy Trevino is a 33 y.o. female with iron deficiency anemia who is seen for new patient assessment.   HPI: The patient was diagnosed with anemia in 2018 following heavy menses. She tried oral iron without improvement. She denies any history of transfusions. She has a history of ice pica.   She was initially seen in the hematology clinic by Dr. Rickard PatienceZhou Yu on 02/21/2018. She was fatigued, felt cold often, and had irregular, heavy menses. She had been on oral iron supplementation for one year. Thyroid function was normal. Hemoglobin was 9.9 and MCV 73 on 02/08/2018.  She received IV Feraheme x2.   She was seen in gynecology by Althea GrimmerAlicia Copland, PA on 02/18/2018.  She had an IUD inserted on 03/11/2018. Transvaginal ultrasound on 03/08/2019 revealed a heterogeneous uterus, but was otherwise normal.   She has received Feraheme x2 on (02/26/2018-03/05/2018) and x1 on 04/04/2018. She tolerated it well and had significant improvement.  Ferritin has been followed: 2 on 02/22/2018, 32 on 04/02/2018, 59 on 06/03/2018, and 35 on 06/10/2019.   The patient was last seen in the hematology clinic on 06/06/2018 by Dr. Cathie HoopsYu.  At that time, her fatigue had  improved significantly.  Hemoglobin was 14.4 and MCV 87.5.  Labs on 06/10/2019:  Hematocrit 42.3, hemoglobin 14.1, MCV 89.6, platelets 217,000, and WBC 6,500.  Ferritin was 35 with an iron saturation 27%.  During the interim, the patient is doing "great.". She no longer has periods. She denies any fevers, sweats, or weight loss. She has chest pain that comes and goes. She recently had to get a hearing aid due to ringing in her right ear, and notes headaches. She denies any exposure to loud noise or trauma.   She denies any blood in her urine, blood in her stool or black stools. She has degenerative disc disease. She denies any family history of blood disorders or easy bruising or bleeding.   She eats fruits, vegetables, and protein, eating meat every other day. She does not eat a lot of leafy green vegetables. She denies any ice pica. She denies any restless legs.    Past Medical History:  Diagnosis Date  . Anemia   . Anxiety   . Asthma   . Depression   . Menorrhagia     Past Surgical History:  Procedure Laterality Date  . CESAREAN SECTION  2012  . TUBAL LIGATION      Family History  Problem Relation Age of Onset  . Heart failure Mother   . Hypertension Mother     Social History:  reports that she quit smoking about 3 years ago. Her smoking use included cigarettes. She has a 1.25 pack-year smoking history. She has never used smokeless tobacco. She reports that she does not drink alcohol or  use drugs. She is a single mom of three kids ages 3811, 2410, and 358. She is a stay at home mom. She lives in [place], [place]. The patient is alone today.  Participants in the patient's visit and their role in the encounter included the patient and Delano Metzesalea Shropshire, RN, today.  The intake visit was provided by Delano Metzesalea Shropshire, RN.  Allergies:  Allergies  Allergen Reactions  . Penicillins Rash    Current Medications: Current Outpatient Medications  Medication Sig Dispense Refill  .  acetaminophen (TYLENOL) 500 MG tablet Take 500 mg by mouth every 6 (six) hours as needed.    . ergocalciferol (VITAMIN D2) 1.25 MG (50000 UT) capsule Take 50,000 Units by mouth once a week.    . fluticasone (FLONASE) 50 MCG/ACT nasal spray Place into both nostrils daily.    Marland Kitchen. levonorgestrel (MIRENA) 20 MCG/24HR IUD 1 each by Intrauterine route once.    . loratadine (CLARITIN) 10 MG tablet Take 1 tablet (10 mg total) by mouth daily. 30 tablet 2  . tiZANidine (ZANAFLEX) 2 MG tablet Take by mouth daily as needed for muscle spasms (at bedtime).     No current facility-administered medications for this visit.     Review of Systems  Constitutional: Positive for malaise/fatigue (improved). Negative for chills, diaphoresis, fever and weight loss.  HENT: Positive for hearing loss (right ear, hearing aide) and tinnitus. Negative for congestion, sinus pain and sore throat.   Eyes: Negative.  Negative for blurred vision.  Respiratory: Negative.  Negative for cough, shortness of breath and wheezing.   Cardiovascular: Positive for chest pain (intermittent). Negative for palpitations, orthopnea, leg swelling and PND.  Gastrointestinal: Negative.  Negative for abdominal pain, blood in stool, constipation, diarrhea, melena, nausea and vomiting.  Genitourinary: Negative.  Negative for dysuria, frequency, hematuria and urgency.       No menses.   Musculoskeletal: Positive for back pain (degenerative disc disease). Negative for joint pain and myalgias.  Skin: Negative.  Negative for rash.  Neurological: Positive for headaches. Negative for dizziness, tingling, sensory change and weakness.  Endo/Heme/Allergies: Negative.  Does not bruise/bleed easily.  Psychiatric/Behavioral: Negative.  Negative for depression, memory loss and substance abuse. The patient is not nervous/anxious and does not have insomnia.   All other systems reviewed and are negative.   Performance status (ECOG): 1  Physical Exam   Constitutional: She is oriented to person, place, and time. She appears well-developed and well-nourished. No distress.  HENT:  Short styled red/brown hair.  Eyes: Conjunctivae and EOM are normal. No scleral icterus.  Brown eyes.  Neurological: She is alert and oriented to person, place, and time.  Skin: She is not diaphoretic.  Psychiatric: She has a normal mood and affect. Her behavior is normal. Judgment and thought content normal.  Nursing note reviewed.   No visits with results within 3 Day(s) from this visit.  Latest known visit with results is:  Appointment on 06/10/2019  Component Date Value Ref Range Status  . Ferritin 06/10/2019 35  11 - 307 ng/mL Final   Performed at [facility], [address] [place], [place] [address]  . Iron 06/10/2019 87  28 - 170 ug/dL Final  . TIBC 60/45/409806/23/2020 326  250 - 450 ug/dL Final  . Saturation Ratios 06/10/2019 27  10.4 - 31.8 % Final  . UIBC 06/10/2019 239  ug/dL Final   Performed at [facility], [address] [place], [place] [address]  . WBC 06/10/2019 6.5  4.0 -  10.5 K/uL Final  . RBC 06/10/2019 4.72  3.87 - 5.11 MIL/uL Final  . Hemoglobin 06/10/2019 14.1  12.0 - 15.0 g/dL Final  . HCT 06/10/2019 42.3  36.0 - 46.0 % Final  . MCV 06/10/2019 89.6  80.0 - 100.0 fL Final  . MCH 06/10/2019 29.9  26.0 - 34.0 pg Final  . MCHC 06/10/2019 33.3  30.0 - 36.0 g/dL Final  . RDW 06/10/2019 12.7  11.5 - 15.5 % Final  . Platelets 06/10/2019 217  150 - 400 K/uL Final  . nRBC 06/10/2019 0.0  0.0 - 0.2 % Final  . Neutrophils Relative % 06/10/2019 66  % Final  . Neutro Abs 06/10/2019 4.3  1.7 - 7.7 K/uL Final  . Lymphocytes Relative 06/10/2019 24  % Final  . Lymphs Abs 06/10/2019 1.6  0.7 - 4.0 K/uL Final  . Monocytes Relative 06/10/2019 6  % Final  . Monocytes Absolute 06/10/2019 0.4  0.1 - 1.0 K/uL Final  . Eosinophils Relative 06/10/2019 3  % Final  . Eosinophils Absolute 06/10/2019 0.2  0.0 - 0.5 K/uL Final  . Basophils  Relative 06/10/2019 1  % Final  . Basophils Absolute 06/10/2019 0.1  0.0 - 0.1 K/uL Final  . Immature Granulocytes 06/10/2019 0  % Final  . Abs Immature Granulocytes 06/10/2019 0.02  0.00 - 0.07 K/uL Final   Performed at [facility], [address], [place], [place] [address]    Assessment:  NIMSI MALES is a 33 y.o. female with iron deficiency anemia felt secondary to menorrhagia.  Diet is good.  She has an IUD. Transvaginal ultrasound on 03/08/2019 revealed a heterogeneous uterus, but was otherwise normal. She no longer has menses.  She has received Feraheme x2 on (02/26/2018-03/05/2018) and x1 on 04/04/2018.   Ferritin has been followed: 2 on 02/22/2018, 32 on 04/02/2018, 59 on 06/03/2018, and 35 on 06/10/2019.   Symptomatically, she feels great.  She denies any melena, hematochezia or hematuria.  She denies pica.  Hemoglobin 14.1.  MCV 89.6.  Plan: 1.   Review labs from 06/10/2019. 2.   Iron deficiency anemia  Review entire medical history, diagnosis and management of iron deficiency.    Etiology appears secondary to menorrhagia.  Hemoglobin 14.1.  MCV 89.6.  Ferritin 35.   Discuss plan for IV iron if ferritin < 30.  Anticipate follow-up labs in 3 months with Margurite Auerbach, NP.  Based on stability in the past year and no further menorrhagia, anticipate counts will remain stable. 3.   RTC prn.  I discussed the assessment and treatment plan with the patient.  The patient was provided an opportunity to ask questions and all were answered.  The patient agreed with the plan and demonstrated an understanding of the instructions.  The patient was advised to call back or seek an in person evaluation if the symptoms worsen or if the condition fails to improve as anticipated.  I provided 23 minutes (9:52 AM - 10:14 AM) of face-to-face video visit time during this this encounter and > 50% was spent counseling as documented under my assessment and plan.  I provided these  services from the [facility] office.   Nolon Stalls, MD, PhD  06/23/2019, 9:52 AM  I, Cloyde Reams Dorshimer, am acting as Education administrator for [employer]. Mike Gip, MD, PhD.  I, Melissa C. Mike Gip, MD, have reviewed the above documentation for accuracy and completeness, and I agree with the above.

## 2019-06-23 ENCOUNTER — Encounter: Payer: Self-pay | Admitting: Hematology and Oncology

## 2019-06-23 ENCOUNTER — Inpatient Hospital Stay: Payer: Medicaid Other | Attending: Oncology | Admitting: Hematology and Oncology

## 2019-06-23 DIAGNOSIS — N920 Excessive and frequent menstruation with regular cycle: Secondary | ICD-10-CM | POA: Diagnosis not present

## 2019-06-23 DIAGNOSIS — D500 Iron deficiency anemia secondary to blood loss (chronic): Secondary | ICD-10-CM

## 2019-06-23 NOTE — Progress Notes (Signed)
Confirmed Name, DOB, and Address. Denies any concerns.  

## 2019-06-24 ENCOUNTER — Other Ambulatory Visit: Payer: Self-pay

## 2019-06-24 ENCOUNTER — Ambulatory Visit: Payer: Medicaid Other | Attending: Nurse Practitioner

## 2019-06-24 ENCOUNTER — Encounter: Payer: Self-pay | Admitting: Physical Therapy

## 2019-06-24 DIAGNOSIS — R262 Difficulty in walking, not elsewhere classified: Secondary | ICD-10-CM | POA: Insufficient documentation

## 2019-06-24 DIAGNOSIS — M545 Low back pain: Secondary | ICD-10-CM | POA: Diagnosis present

## 2019-06-24 DIAGNOSIS — G8929 Other chronic pain: Secondary | ICD-10-CM | POA: Insufficient documentation

## 2019-06-24 NOTE — Therapy (Signed)
[facility] [address], [place], [address] Phone: [phone]   Fax:  [phone]  Physical Therapy Treatment  Patient Details  Name: Cathy Trevino MRN: [id number] Date of Birth: [birth year] Referring Provider (PT): Margurite Auerbach, NP-C   Encounter Date: 06/24/2019  PT End of Session - 06/24/19 1308    Visit Number  32    Number of Visits  35    Date for PT Re-Evaluation  07/15/19    Authorization Type  Medicaid reporting period from 06/03/2019; auth through 07/21/2019    Authorization Time Period  Current Certification period: 06/03/2019 - 5/73/2202 (last PN/Re-Cert 5/42/7062)    Authorization - Visit Number  3    Authorization - Number of Visits  6    PT Start Time  3762    PT Stop Time  1346    PT Time Calculation (min)  43 min    Activity Tolerance  Patient limited by fatigue;Patient limited by pain    Behavior During Therapy  [facility] for tasks assessed/performed       Past Medical History:  Diagnosis Date  . Anemia   . Anxiety   . Asthma   . Depression   . Menorrhagia     Past Surgical History:  Procedure Laterality Date  . CESAREAN SECTION  2012  . TUBAL LIGATION      There were no vitals filed for this visit.  Subjective Assessment - 06/24/19 1306    Subjective  Patient stated that her pain is about 5/10 today, stated its been that way for a couple of weeks. Reported that she was fine after her last PT session, not too tired/sore.    Pertinent History  Patient is a 33 y.o. female who presents to outpatient physical therapy with a referral for chronic low back pain. This patient's chief complaints consist of pain, leading to the following functional deficits: difficulty with all activities that require movement or motion including lifting, bending, prolonged positions, sleeping. Relevant past medical history and comorbidities include "musculoskeletal issue around heart" told to take tylenol when  needed (sees cardiologist when he feels is necessary, last was July 2018), obesity, previous episodes of low back pain, history of depression and anxiety, anemia. Imaging: From MRI report dated 07/15/2018: "1. 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 2. Diffusely decreased T1 signal throughout the visualized bone marrow, nonspecific, but suspected to be related to patient's history of anemia. 3. Otherwise normal MRI of the lumbar spine."    Limitations  Sitting;Walking;Lifting;House hold activities;Standing    How long can you sit comfortably?  ~ 20 mins on a surface with a cushon, less than that on a wooden chair.    How long can you stand comfortably?  about 15 minutes    How long can you walk comfortably?  10 min    Diagnostic tests  MRI report dated 07/15/2018: "1. 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 2. Diffusely decreased T1 signal throughout the visualized bone marrow, nonspecific, but suspected to be related to patient's history of anemia. 3. Otherwise normal MRI of the lumbar spine."    Patient Stated Goals  Get rid of the pain so she can function better.     Currently in Pain?  Yes    Pain Score  5  Pain Location  Back    Pain Orientation  Right;Mid;Posterior    Pain Descriptors / Indicators  Sharp    Pain Type  Chronic pain    Pain Onset  More than a month ago    Pain Frequency  Intermittent       TREATMENT:   Denies latex allergy.    Therapeutic exercise: to centralize symptoms and improve ROM and strength required for successful completion of functional activities.    Treadmill level up to 2 mph, 5% grade. For improved lower extremity mobility, muscular endurance, and weightbearing activity tolerance; and to induce the analgesic effect of aerobic exercise, stimulate  improved joint nutrition, and prepare body structures and systems for following interventions. x 6  Minutes during subjective exam.   -seated lat pull down isolating scapular motion from arm motion. To provide light distraction force on spine while strengthening back muscles. 2x10, @ 10#  - back lunge with ipsilateral UE support using TRX. x10 reps each side. Comes to complete rest in kneeling prior to standing up. Struggles to maintain tension throughout body and stable core, verbal/visual cues to attempt to decrease trunk twisting/knee valgus when coming from kneeling to standing, improved control on RLE compared to LLE. Goal; To improve trunk, hip, LE, and postural control during asymmetrical loading to strengthen patterns found challenging/painful in daily activities.   - single leg hinge reaching both hands front with flat back to touch treatmill railing while kicking one foot back to touch table positioned just within reach to cue body to move as in a single leg deadlift to improve core and single leg/hip strength, holding black theraband in both hands maintaining tension pulling apart in front of body moving from chest to elbows extended toward the treadmill bars to improve tension throughout body to improve core activation.Small balance dome next to support leg to rest moving leg on to improve return to standing position and single leg balance and core activation. Patient struggled but able to perform good reps about 25% of the time, tactile cues at hip and verbal cues for core activation and to keep back straight.  Lateral step ups with foam on step, base +2, 10-15 ea side. x5 without liftting contralateral leg, x10 with lift, balance achieved first ~10 ea side, use of theraband to stabilize body. PT stabilizing blue foam, verbal cues to core activation/trunk control.   standing on foam with single leg balance, cues to move contralateral leg (leg lifted) through hip flexion (goal 90/90) to hip  extension to touch therapist hand posteriorly. x10 ea attempted on foam, x5 off foam ea, improved control noted off of foam. Cues to keep trunk upright, shoulders and hips symmetrical and facing forward. Goal; increased core control/trunk control and postural awareness especially on unstable surface  - step up to 12 inch (black stool) step with contralateral knee high while maintaining balance, to improve glute activation and core stability x10 each side. Second set holding black theraband in both hands maintaining tension pulling apart at 90 degrees shoulder flexion with elbows extended, to improve tension throughout body to improve core activation. Cuing for form to improve core activation/awareness  Pt response/clinical impression: Pt moderately fatigued at end of session, most challenged by multijoint exercises and maintaining trunk control via core stabilization. Cues throughout session necessary to maximize proper muscle activation, exercise form/technique, and to encourage patient. No long lasting pain relief effects this session with exercises. the patient would benefit from reivew of HEP next visit in preparation for self  management of condition. The patient would benefit from further skilled PT to continue to progress towards goals.    HOME EXERCISE PROGRAM Access Code: [email or web address] (on hold for now, more current) URL: [email or web address]  Date: 05/19/2019  Prepared by: Rosita Kea    Access Code: [facility] (on hold for now).  URL: [email or web address]  Date: 04/08/2019  Prepared by: Rosita Kea    Access Code: [email or web address] (CURRENT) URL: [email or web address]  Date: 06/16/2019  Prepared by: Rosita Kea   Program Notes  Complete entire program 2-3 times per week.   Exercises   Glute Bridge on [place] - 2-3 sets - 10-15 reps - 1 second hold   Supine Bridge with Heels on Swiss Ball and Knees Bent - 2-3 sets - 10-15 reps - 1 second hold   Single  Leg Bridge - 2-3 sets - 10-15 reps - 1 second hold   Mini Pike on [employer] - 2-3 sets - 5-10 reps   Runner's Step up with Arms Forward - 2-3 sets - 10-15 reps - 1 second hold   The Diver - 2-3 sets - 10-15 reps  Assisted Lunge with TRX - 2-3 sets - 10-15 reps     PT Education - 06/24/19 1308    Education Details  Exercise technique/form, activation    Person(s) Educated  Patient    Methods  Explanation;Demonstration;Tactile cues;Verbal cues    Comprehension  Verbalized understanding       PT Short Term Goals - 06/03/19 1814      PT SHORT TERM GOAL #1   Title  Be independent with home exercise program completed at least 3 times per week for self-management of symptoms.    Baseline  Initial HEP provided at initial eval (11/04/2018);    Time  2    Period  Weeks    Status  Achieved    Target Date  11/19/18      PT SHORT TERM GOAL #2   Title  Reduce pain to 8/10 with functional activities to allow patient to complete valued functional tasks such as sit to stand with less difficulty.    Baseline  reports 10/10 at initial evaluation (11/04/2018); 9.5/10 (11/28/2018); 8/10 at night while trying to sleep (12/09/2018); 5/10 (06/03/2019);    Time  2    Period  Weeks    Status  Achieved    Target Date  12/12/18      PT SHORT TERM GOAL #3   Title  Improve lumbar AROM to 75% to allow patient to complete valued activities with less difficulty.     Baseline  50% with end range pain at initial evaluaiton (11/04/2018);     Time  2    Period  Weeks    Status  Achieved    Target Date  11/19/18        PT Long Term Goals - 06/03/19 1815      PT LONG TERM GOAL #1   Title  Be independent with a long-term home exercise program for self-management of symptoms.     Baseline  Initial HEP provided at initial eval (11/04/2018); Initial HEP and progressions have been provided but not final progressions (11/28/2018); Initial HEP and progressions have been provided but not final progressions  (12/09/2018; 01/06/2019; 02/26/2019; 04/23/2019; 06/03/2019)    Time  6    Period  Weeks    Status  Partially Met    Target Date  07/15/19      PT LONG TERM GOAL #2   Title  Demonstrate  improved FOTO score by 10 units to demonstrate improvement in overall condition and self-reported functional ability.     Baseline  Foto = 47 (11/04/2018); too early to re-measure (11/28/2018); not measured (12/09/2018); 44 (01/06/2019, 02/26/2019 - questionnaire is only asking one question, no longer valid, see new goal).     Time  6    Period  Weeks    Status  Deferred    Target Date  03/03/19      PT LONG TERM GOAL #3   Title  Complete community, work and/or recreational activities without limitation due to current condition.     Baseline  limited in daily activities and responsibilities, limited to 5# of lifting, disrupts sleep. 5# weight lift limit (11/04/2018); pt reports increased activty since starting PT, continues to have similar limitations to baseline (11/28/2018);; patient reports increased activity since strating PT, continues to have similar limitations to baseline but with slightly lower pain (12/09/2018); continues to have similar limitations but with less pain overall (01/06/2019); feels similar limitations but more better days than bad days now (02/26/2019).; continues to have difficulty picking up son (04/23/2019); continues to have difficulty picking up > 50# autistic son who throws tantrums, feels overall she is able to do more and manage her pain better (06/03/2019);    Time  6    Period  Weeks    Status  Partially Met    Target Date  07/15/19      PT LONG TERM GOAL #4   Title  Patient will increase BLE gross strength to 4+/5 as to improve functional strength for independent gait, increased standing tolerance and increased ADL ability.    Baseline  Hip: Abduction: R = 3+/5 painful , L = 4+/5 painful!. Hip Flexion: R = 3+/5, L = 4/5. Knee Flex: R = 4/5, L = 5/5. (11/04/2018); see objective exam  (01/06/2019);  see objective exam (02/26/2019); did not measure formally (04/23/2019); BLE strength 5/5 except B hip extension painful and 3+/5 and left hip abduction is painful.    Time  6    Period  Weeks    Status  Partially Met    Target Date  07/15/19      PT LONG TERM GOAL #5   Title  Be able to squat to chair height with proper form without limitation due to current condition in order to improve ability to lift and complete transfers during usual activities.     Baseline  Difficulty with sit<>stand transfers due to pain but less than at initial eval (11/28/2018); improved sit <> stand transfers with reduced pain compared to initial eval (12/09/2018);; able to complete partial squat and improved sit to stand but continued pain (01/06/2019);  able to quat to chair height with mild discomfort (02/26/2019; 06/03/2019);    Time  6    Period  Weeks    Status  Achieved    Target Date  04/09/19      PT LONG TERM GOAL #6   Title  Demonstrate improved modified Oswestry Disability Index (mODI) score to equal or less than 20% to demonstrate improvement in overall condition and self-reported functional ability.     Baseline  50% (02/26/2019); 42% (04/23/2019); 28% (06/03/2019);    Time  6    Period  Weeks    Status  Partially Met    Target Date  07/15/19      PT LONG TERM GOAL #7   Title  Patient will be able to complete 5 back lunges with knee  to floor and light UE support with good form to improve her activity tolerance for activities requiring assymetrical loading such as picking up her son.     Baseline  Back lunge: excessive forward bend, unable to touch knee to ground, unsteady, requires unilateral UE support (04/23/2019); able to complete 5 reps without UE support with difficulty controlling descent demonstrating weakness.    Time  6    Period  Weeks    Status  Partially Met    Target Date  07/15/19            Plan - 06/24/19 1545    Clinical Impression Statement  Pt moderately fatigued at  end of session, most challenged by multijoint exercises and maintain trunk control via core stabilization. Cues throughout session necessary to maximize proper muscle activation, exercise form/technique, and to encourage patient. No long lasting pain relief effects this session with exercises. The patient would benefit from further skilled PT to continue to progress towards goals.    Personal Factors and Comorbidities  Time since onset of injury/illness/exacerbation    Examination-Activity Limitations  Sit;Transfers;Sleep;Bend;Lift;Squat;Locomotion Level;Stairs;Caring for Others;Stand;Carry    Examination-Participation Restrictions  Interpersonal Relationship;Yard Work;Cleaning;Laundry;Community Activity;Other;Driving;Meal Prep;Shop    Rehab Potential  Good    Clinical Impairments Affecting Rehab Potential  (+) denies leg symptoms; motivated; complient with HEP  (-) lack of readily responsive pain    PT Frequency  2x / week    PT Duration  6 weeks    PT Treatment/Interventions  ADLs/Self Care Home Management;Moist Heat;Cryotherapy;Functional mobility training;Stair training;Therapeutic activities;Therapeutic exercise;Balance training;Neuromuscular re-education;Patient/family education;Manual techniques;Passive range of motion;Dry needling;Spinal Manipulations;Joint Manipulations;Other (comment)    PT Next Visit Plan  continue progressive functional and core strengthing in graded manner to improve tolerance. Consider 1x a week in clinic with 1x a week indpendent exerercise session to help transition to more independence.    PT Home Exercise Plan  [facility]  Access Code: [facility] and Access Code: [email or web address]     Consulted and Agree with Plan of Care  Patient       Patient will benefit from skilled therapeutic intervention in order to improve the following deficits and impairments:  Decreased endurance, Decreased mobility, Difficulty walking, Hypomobility, Increased muscle spasms, Decreased range of motion,  Impaired perceived functional ability, Impaired tone, Obesity, Decreased activity tolerance, Decreased strength, Postural dysfunction, Pain, Impaired flexibility, Improper body mechanics, Decreased coordination  Visit Diagnosis: 1. Chronic bilateral low back pain without sciatica   2. Difficulty in walking, not elsewhere classified        Problem List Patient Active Problem List   Diagnosis Date Noted  . Menorrhagia with regular cycle 03/11/2018  . Microcytic anemia 02/25/2018  . Iron deficiency anemia due to chronic blood loss 02/18/2018    Lieutenant Diego PT, DPT 4:27 PM,06/24/19 [phone]  [facility] [address] [place], [address] Phone: [phone]   Fax:  [phone]  Name: Cathy Trevino MRN: [id number] Date of Birth: October 12, 1986

## 2019-06-26 ENCOUNTER — Encounter: Payer: Medicaid Other | Admitting: Physical Therapy

## 2019-07-01 ENCOUNTER — Other Ambulatory Visit: Payer: Self-pay

## 2019-07-01 ENCOUNTER — Ambulatory Visit: Payer: Medicaid Other | Admitting: Physical Therapy

## 2019-07-01 ENCOUNTER — Encounter: Payer: Self-pay | Admitting: Physical Therapy

## 2019-07-01 DIAGNOSIS — M545 Low back pain, unspecified: Secondary | ICD-10-CM

## 2019-07-01 DIAGNOSIS — R262 Difficulty in walking, not elsewhere classified: Secondary | ICD-10-CM

## 2019-07-01 DIAGNOSIS — G8929 Other chronic pain: Secondary | ICD-10-CM

## 2019-07-01 NOTE — Therapy (Signed)
[facility] [address] [place], [address] Phone: [phone]   Fax:  [phone]  Physical Therapy Treatment  Patient Details  Name: Cathy Trevino MRN: [id number] Date of Birth: [birth year] Referring Provider (PT): Margurite Auerbach, NP-C   Encounter Date: 07/01/2019  PT End of Session - 07/01/19 1335    Visit Number  33    Number of Visits  35    Date for PT Re-Evaluation  07/15/19    Authorization Type  Medicaid reporting period from 06/03/2019; auth through 07/21/2019    Authorization Time Period  Current Certification period: 06/03/2019 - 5/00/3704 (last PN/Re-Cert 8/88/9169)    Authorization - Visit Number  4    Authorization - Number of Visits  6    PT Start Time  0945    PT Stop Time  1030    PT Time Calculation (min)  45 min    Activity Tolerance  Patient limited by fatigue;Patient limited by pain    Behavior During Therapy  [facility] for tasks assessed/performed       Past Medical History:  Diagnosis Date  . Anemia   . Anxiety   . Asthma   . Depression   . Menorrhagia     Past Surgical History:  Procedure Laterality Date  . CESAREAN SECTION  2012  . TUBAL LIGATION      There were no vitals filed for this visit.  Subjective Assessment - 07/01/19 0948    Subjective  Patient reports her pain is a 5-6/10 today. It went up to this level for no apparent reason on Sunday. She missed her neurosurgeon appoinment last week because her child was sick. She got her hearing aide and feels she can hear a lot better now. She states she felt no better or worse following last treatment session. Pateint report she is doing parts of her HEP daily but was confused about the 2-3 time a week program we were working at establishing recently. She reports she has been getting intermittant tingling in her L heel, R toes, and bilateral anterior thighs. She does not notice a positional pattern of when she feels paresthesias  in these places. She denies any leg symptoms upon arrival today.    Pertinent History  Patient is a 33 y.o. female who presents to outpatient physical therapy with a referral for chronic low back pain. This patient's chief complaints consist of pain, leading to the following functional deficits: difficulty with all activities that require movement or motion including lifting, bending, prolonged positions, sleeping. Relevant past medical history and comorbidities include "musculoskeletal issue around heart" told to take tylenol when needed (sees cardiologist when he feels is necessary, last was July 2018), obesity, previous episodes of low back pain, history of depression and anxiety, anemia. Imaging: From MRI report dated 07/15/2018: "1. 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 2. Diffusely decreased T1 signal throughout the visualized bone marrow, nonspecific, but suspected to be related to patient's history of anemia. 3. Otherwise normal MRI of the lumbar spine."    Limitations  Sitting;Walking;Lifting;House hold activities;Standing    How long can you sit comfortably?  ~ 20 mins on a surface with a cushon, less than that on a wooden chair.    How long can you stand comfortably?  about 15 minutes    How long can you walk comfortably?  10 min  Diagnostic tests  MRI report dated 07/15/2018: "1. 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 2. Diffusely decreased T1 signal throughout the visualized bone marrow, nonspecific, but suspected to be related to patient's history of anemia. 3. Otherwise normal MRI of the lumbar spine."    Patient Stated Goals  Get rid of the pain so she can function better.     Currently in Pain?  Yes    Pain Score  6     Pain Location  Back    Pain Orientation  Posterior    Pain  Descriptors / Indicators  Constant    Pain Type  Chronic pain    Pain Onset  More than a month ago        TREATMENT:  Denies latex allergy.  Therapeutic exercise:to centralize symptoms and improve ROM and strength required for successful completion of functional activities.   Treadmill levelup to2.59mh,5% grade.For improved lower extremity mobility, muscular endurance, and weightbearing activity tolerance; and to induce the analgesic effect of aerobic exercise, stimulate improved joint nutrition, and prepare body structures and systems for following interventions. x5Minutes during subjective exam.  Review of exercises for long term HEP:  - front plank off theraball placed at proximal thighs to hands with focus on correction of back sag. Attempted to slide weight each direction but unable to control. Settled on plank 30 seconds x 3 without attempting to move hands. Patient demonstrates ability to achieve proper form with minimal encouragement and cuing from clinician. to improve abdominal/core strength and control.  - Glute bridges with upper back/shoulders on theraball, knees flexed to 90 degrees. To table top position. Progressed to hands off floor. Completed2x10 reps with double leg stance. To improve core stability and glute/posterior chain strength. Required cuing for proper alignment. Repeated until pt felt better about finding position independently. A bit apprehensive about using theraball, but improved.  - single leg bridge x 7 with L down, x 5 with R down for 2 sets. To improve trunk and posterior chain stability with asymetrical loading. Patient with more difficulty tolerating R side. Able to perform with good form and minimal cuing.  - prone knee to chest from plank position with theraball under distal thighs. x3 x2 plus additional failed attempts and rest with pauses for improved cuing and demonstration. Discussed effect of pain on motor control and activity tolerance.   - step up to 12 inch (black stool) stepwith contralateral knee high while maintaining balance,to improve glute activation and core stability x10 each side. Second set holding black theraband in both hands maintaining tension pulling apart at 90 degrees shoulder flexion with elbows extended, to improve tension throughout body to improve core activation. Cuing for form to improve core activation/awareness - single leg hinge reaching both hands front while holding theraband in slight horizontal abduction with flat back to touch chair while kicking one foot back to move as in a single leg deadlift to improve core and single leg/hip strength. blue theraband in both hands maintaining tension pulling apart in front of body to improve tension throughout body to improve core activation. x10 R foot forward with improved form compared to last session.  - Education on HEP including handout to help with planning for self-management.    HOME EXERCISE PROGRAM Access Code: [email or web address]) URL: [email or web address]  Date: 07/01/2019  Prepared by: SRosita Kea  Program Notes  Complete entire program 2-3 times per week.  Exercises  Plank with Thighs on [employer] - 3 reps - 30 seconds hold  Glute Bridge on [employer] - 2-3 sets - 10-15 reps - 1 second hold  [employer] on [employer] - 2-3 sets - 5-10 reps  Single Leg Bridge - 2-3 sets - 5-10 reps - 1 second hold  Runner's Step up with Arms Forward - 2-3 sets - 10-15 reps - 1 second hold  Assisted Lunge with TRX - 2-3 sets - 10-15 reps  [employer] - 2-3 sets - 10-15 reps     PT Education - 07/01/19 1335    Education Details  Exercise purpose/form. Self management techniques. Education on diagnosis, prognosis, POC, anatomy and physiology of current condition    Person(s) Educated  Patient    Methods  Explanation;Demonstration;Tactile cues;Verbal cues;Handout    Comprehension  Verbalized understanding;Returned demonstration;Verbal cues  required;Tactile cues required       PT Short Term Goals - 06/03/19 1814      PT SHORT TERM GOAL #1   Title  Be independent with home exercise program completed at least 3 times per week for self-management of symptoms.    Baseline  Initial HEP provided at initial eval (11/04/2018);    Time  2    Period  Weeks    Status  Achieved    Target Date  11/19/18      PT SHORT TERM GOAL #2   Title  Reduce pain to 8/10 with functional activities to allow patient to complete valued functional tasks such as sit to stand with less difficulty.    Baseline  reports 10/10 at initial evaluation (11/04/2018); 9.5/10 (11/28/2018); 8/10 at night while trying to sleep (12/09/2018); 5/10 (06/03/2019);    Time  2    Period  Weeks    Status  Achieved    Target Date  12/12/18      PT SHORT TERM GOAL #3   Title  Improve lumbar AROM to 75% to allow patient to complete valued activities with less difficulty.     Baseline  50% with end range pain at initial evaluaiton (11/04/2018);     Time  2    Period  Weeks    Status  Achieved    Target Date  11/19/18        PT Long Term Goals - 06/03/19 1815      PT LONG TERM GOAL #1   Title  Be independent with a long-term home exercise program for self-management of symptoms.     Baseline  Initial HEP provided at initial eval (11/04/2018); Initial HEP and progressions have been provided but not final progressions (11/28/2018); Initial HEP and progressions have been provided but not final progressions (12/09/2018; 01/06/2019; 02/26/2019; 04/23/2019; 06/03/2019)    Time  6    Period  Weeks    Status  Partially Met    Target Date  07/15/19      PT LONG TERM GOAL #2   Title  Demonstrate improved FOTO score by 10 units to demonstrate improvement in overall condition and self-reported functional ability.     Baseline  Foto = 47 (11/04/2018); too early to re-measure (11/28/2018); not measured (12/09/2018); 44 (01/06/2019, 02/26/2019 - questionnaire is only asking one  question, no longer valid, see new goal).     Time  6    Period  Weeks    Status  Deferred    Target Date  03/03/19      PT LONG TERM GOAL #3   Title  Complete community, work and/or recreational activities without limitation due to current condition.     Baseline  limited in daily activities and responsibilities, limited to 5# of lifting, disrupts sleep. 5# weight lift limit (11/04/2018); pt reports increased activty since starting PT, continues to have similar limitations to baseline (11/28/2018);; patient reports increased activity since strating PT, continues to have similar limitations to baseline but with slightly lower pain (12/09/2018); continues to have similar limitations but with less pain overall (01/06/2019); feels similar limitations but more better days than bad days now (02/26/2019).; continues to have difficulty picking up son (04/23/2019); continues to have difficulty picking up > 50# autistic son who throws tantrums, feels overall she is able to do more and manage her pain better (06/03/2019);    Time  6    Period  Weeks    Status  Partially Met    Target Date  07/15/19      PT LONG TERM GOAL #4   Title  Patient will increase BLE gross strength to 4+/5 as to improve functional strength for independent gait, increased standing tolerance and increased ADL ability.    Baseline  Hip: Abduction: R = 3+/5 painful , L = 4+/5 painful!. Hip Flexion: R = 3+/5, L = 4/5. Knee Flex: R = 4/5, L = 5/5. (11/04/2018); see objective exam (01/06/2019);  see objective exam (02/26/2019); did not measure formally (04/23/2019); BLE strength 5/5 except B hip extension painful and 3+/5 and left hip abduction is painful.    Time  6    Period  Weeks    Status  Partially Met    Target Date  07/15/19      PT LONG TERM GOAL #5   Title  Be able to squat to chair height with proper form without limitation due to current condition in order to improve ability to lift and complete transfers during usual activities.      Baseline  Difficulty with sit<>stand transfers due to pain but less than at initial eval (11/28/2018); improved sit <> stand transfers with reduced pain compared to initial eval (12/09/2018);; able to complete partial squat and improved sit to stand but continued pain (01/06/2019);  able to quat to chair height with mild discomfort (02/26/2019; 06/03/2019);    Time  6    Period  Weeks    Status  Achieved    Target Date  04/09/19      PT LONG TERM GOAL #6   Title  Demonstrate improved modified Oswestry Disability Index (mODI) score to equal or less than 20% to demonstrate improvement in overall condition and self-reported functional ability.     Baseline  50% (02/26/2019); 42% (04/23/2019); 28% (06/03/2019);    Time  6    Period  Weeks    Status  Partially Met    Target Date  07/15/19      PT LONG TERM GOAL #7   Title  Patient will be able to complete 5 back lunges with knee to floor and light UE support with good form to improve her activity tolerance for activities requiring assymetrical loading such as picking up her son.     Baseline  Back lunge: excessive forward bend, unable to touch knee to ground, unsteady, requires unilateral UE support (04/23/2019); able to complete 5 reps without UE support with difficulty controlling descent demonstrating weakness.    Time  6    Period  Weeks    Status  Partially Met    Target Date  07/15/19  Plan - 07/01/19 2002    Clinical Impression Statement  Patient continued to have pain that made it difficult for her to perform exercises but that was no worse or better by end of session. Noted some tingling in the anterior R thigh following bridging activity but it resolved after moving on to next activity. Continues to be most challenged by multijoint exercises and maintaining trunk control via core stabilization. Cues throughout session necessary to maximize proper muscle activation, exercise form/technique, and to encourage patient. No long  lasting pain relief effects this session with exercises. the patient would benefit from continued reivew of HEP next visit in preparation for self management of condition. The patient would benefit from further skilled PT to continue to progress towards goals.    Personal Factors and Comorbidities  Time since onset of injury/illness/exacerbation    Examination-Activity Limitations  Sit;Transfers;Sleep;Bend;Lift;Squat;Locomotion Level;Stairs;Caring for Others;Stand;Carry    Examination-Participation Restrictions  Interpersonal Relationship;Yard Work;Cleaning;Laundry;Community Activity;Other;Driving;Meal Prep;Shop    Rehab Potential  Good    Clinical Impairments Affecting Rehab Potential  (+) denies leg symptoms; motivated; complient with HEP  (-) lack of readily responsive pain    PT Frequency  2x / week    PT Duration  6 weeks    PT Treatment/Interventions  ADLs/Self Care Home Management;Moist Heat;Cryotherapy;Functional mobility training;Stair training;Therapeutic activities;Therapeutic exercise;Balance training;Neuromuscular re-education;Patient/family education;Manual techniques;Passive range of motion;Dry needling;Spinal Manipulations;Joint Manipulations;Other (comment)    PT Next Visit Plan  prepare for self-management following discharge. Continue to hone HEP to be something acheivable by patient.    PT Home Exercise Plan  Medbridge  Access Code: [facility] and Access Code: [email or web address]     Consulted and Agree with Plan of Care  Patient       Patient will benefit from skilled therapeutic intervention in order to improve the following deficits and impairments:  Decreased endurance, Decreased mobility, Difficulty walking, Hypomobility, Increased muscle spasms, Decreased range of motion, Impaired perceived functional ability, Impaired tone, Obesity, Decreased activity tolerance, Decreased strength, Postural dysfunction, Pain, Impaired flexibility, Improper body mechanics, Decreased coordination  Visit  Diagnosis: 1. Chronic bilateral low back pain without sciatica   2. Difficulty in walking, not elsewhere classified        Problem List Patient Active Problem List   Diagnosis Date Noted  . Menorrhagia with regular cycle 03/11/2018  . Microcytic anemia 02/25/2018  . Iron deficiency anemia due to chronic blood loss 02/18/2018    Everlean Alstrom. Graylon Good, PT, DPT 07/01/19, 8:03 PM  [facility] [address] [place], [address] Phone: [phone]   Fax:  [phone]  Name: AREYANA LEONI MRN: [id number] Date of Birth: 09/15/1986

## 2019-07-08 ENCOUNTER — Encounter: Payer: Self-pay | Admitting: Physical Therapy

## 2019-07-08 ENCOUNTER — Other Ambulatory Visit: Payer: Self-pay

## 2019-07-08 ENCOUNTER — Ambulatory Visit: Payer: Medicaid Other | Admitting: Physical Therapy

## 2019-07-08 DIAGNOSIS — R262 Difficulty in walking, not elsewhere classified: Secondary | ICD-10-CM

## 2019-07-08 DIAGNOSIS — G8929 Other chronic pain: Secondary | ICD-10-CM

## 2019-07-08 DIAGNOSIS — M545 Low back pain: Secondary | ICD-10-CM | POA: Diagnosis not present

## 2019-07-08 NOTE — Therapy (Signed)
[facility] [address] [place], [address] Phone: [phone]   Fax:  [phone]  Physical Therapy Treatment  Patient Details  Name: Cathy Trevino MRN: [id number] Date of Birth: [birth year] Referring Provider (PT): Margurite Auerbach, NP-C   Encounter Date: 07/08/2019  PT End of Session - 07/08/19 1527    Visit Number  34    Number of Visits  35    Date for PT Re-Evaluation  07/15/19    Authorization Type  Medicaid reporting period from 06/03/2019; auth through 07/21/2019    Authorization Time Period  Current Certification period: 06/03/2019 - 4/00/8676 (last PN/Re-Cert 1/95/0932)    Authorization - Visit Number  5    Authorization - Number of Visits  6    PT Start Time  1035    PT Stop Time  1115    PT Time Calculation (min)  40 min    Activity Tolerance  Patient limited by fatigue;Patient limited by pain    Behavior During Therapy  [facility] for tasks assessed/performed       Past Medical History:  Diagnosis Date  . Anemia   . Anxiety   . Asthma   . Depression   . Menorrhagia     Past Surgical History:  Procedure Laterality Date  . CESAREAN SECTION  2012  . TUBAL LIGATION      There were no vitals filed for this visit.  Subjective Assessment - 07/08/19 1043    Subjective  Patient reports her pain is 5.5/10 today in her usual spot in the R lumbar region. She saw the neurosurgeon who is trying to set up injections for her. She states her pain continues to come and go. she has been doing some of her HEP daily.    Pertinent History  Patient is a 33 y.o. female who presents to outpatient physical therapy with a referral for chronic low back pain. This patient's chief complaints consist of pain, leading to the following functional deficits: difficulty with all activities that require movement or motion including lifting, bending, prolonged positions, sleeping. Relevant past medical history and comorbidities  include "musculoskeletal issue around heart" told to take tylenol when needed (sees cardiologist when he feels is necessary, last was July 2018), obesity, previous episodes of low back pain, history of depression and anxiety, anemia. Imaging: From MRI report dated 07/15/2018: "1. 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 2. Diffusely decreased T1 signal throughout the visualized bone marrow, nonspecific, but suspected to be related to patient's history of anemia. 3. Otherwise normal MRI of the lumbar spine."    Limitations  Sitting;Walking;Lifting;House hold activities;Standing    How long can you sit comfortably?  ~ 20 mins on a surface with a cushon, less than that on a wooden chair.    How long can you stand comfortably?  about 15 minutes    How long can you walk comfortably?  10 min    Diagnostic tests  MRI report dated 07/15/2018: "1. 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 2. Diffusely decreased T1 signal throughout the visualized bone marrow, nonspecific, but suspected to be related to patient's history of anemia. 3. Otherwise normal MRI of the lumbar spine."    Patient Stated Goals  Get rid of the pain so she can function better.  Pain Onset  More than a month ago          TREATMENT: Denies latex allergy.  Therapeutic exercise:to centralize symptoms and improve ROM and strength required for successful completion of functional activities.   Treadmill levelup to64mh,5% grade.For improved lower extremity mobility, muscular endurance, and weightbearing activity tolerance; and to induce the analgesic effect of aerobic exercise, stimulate improved joint nutrition, and prepare body structures and systems for following interventions. x6Minutes during subjective exam.  Review and  continued focus on exercises for long term HEP:  - single leg hinge reaching both hands front while holding theraband in slight horizontal abduction with flat back to touch chair while kicking one foot back to move as in a single leg deadlift to improve core and single leg/hip strength. blue theraband in both hands maintaining tension pulling apart in front of body to improve tension throughout body to improve core activation. 3x10 R foot forward with improved form compared to last session.  - variations on running man exercise focusing on breaking it down step by step to improve hip hinge and core and hip stability. Patient motivated to continue practicing and progressed to being able to complete 3x10 reps on each side with unilateral UE support. Added to HEP.  - hip hikes off edge of step for improved lateral hip strength. 3x10 each side, unilateral UE support. With additional time to teach patient technique.  - Education on HEP including handout to help with planning for self-management.  HOME EXERCISE PROGRAM Access Code: [email or web address]  URL: [email or web address]  Date: 07/08/2019  Prepared by: SRosita Kea  Program Notes  Complete entire program 2-3 times per week.   Exercises  Plank with Thighs on [place] [employer]- 3 reps - 30 seconds hold  Glute Bridge on [place]- 2-3 sets - 10-15 reps - 1 second hold  Runner's Step up with Arms Forward - 2-3 sets - 10-15 reps - 1 second hold  Single Leg Running Balance - 2-3 sets - 10-15 reps  Single Leg Bridge - 2-3 sets - 5-10 reps - 1 second hold  Squat with Chair Touch - 2-3 sets - 10-15 reps  Hip Hikes off step - 2-3 sets - 10-15 reps    PT Education - 07/08/19 1527    Education Details  Exercise purpose/form. Self management techniques. Education on diagnosis, prognosis, POC, anatomy and physiology of current condition    Person(s) Educated  Patient    Methods  Explanation;Demonstration;Tactile cues;Verbal cues;Handout    Comprehension   Verbalized understanding;Returned demonstration;Verbal cues required;Tactile cues required       PT Short Term Goals - 06/03/19 1814      PT SHORT TERM GOAL #1   Title  Be independent with home exercise program completed at least 3 times per week for self-management of symptoms.    Baseline  Initial HEP provided at initial eval (11/04/2018);    Time  2    Period  Weeks    Status  Achieved    Target Date  11/19/18      PT SHORT TERM GOAL #2   Title  Reduce pain to 8/10 with functional activities to allow patient to complete valued functional tasks such as sit to stand with less difficulty.    Baseline  reports 10/10 at initial evaluation (11/04/2018); 9.5/10 (11/28/2018); 8/10 at night while trying to sleep (12/09/2018); 5/10 (06/03/2019);    Time  2    Period  Weeks    Status  Achieved  Target Date  12/12/18      PT SHORT TERM GOAL #3   Title  Improve lumbar AROM to 75% to allow patient to complete valued activities with less difficulty.     Baseline  50% with end range pain at initial evaluaiton (11/04/2018);     Time  2    Period  Weeks    Status  Achieved    Target Date  11/19/18        PT Long Term Goals - 06/03/19 1815      PT LONG TERM GOAL #1   Title  Be independent with a long-term home exercise program for self-management of symptoms.     Baseline  Initial HEP provided at initial eval (11/04/2018); Initial HEP and progressions have been provided but not final progressions (11/28/2018); Initial HEP and progressions have been provided but not final progressions (12/09/2018; 01/06/2019; 02/26/2019; 04/23/2019; 06/03/2019)    Time  6    Period  Weeks    Status  Partially Met    Target Date  07/15/19      PT LONG TERM GOAL #2   Title  Demonstrate improved FOTO score by 10 units to demonstrate improvement in overall condition and self-reported functional ability.     Baseline  Foto = 47 (11/04/2018); too early to re-measure (11/28/2018); not measured (12/09/2018); 44  (01/06/2019, 02/26/2019 - questionnaire is only asking one question, no longer valid, see new goal).     Time  6    Period  Weeks    Status  Deferred    Target Date  03/03/19      PT LONG TERM GOAL #3   Title  Complete community, work and/or recreational activities without limitation due to current condition.     Baseline  limited in daily activities and responsibilities, limited to 5# of lifting, disrupts sleep. 5# weight lift limit (11/04/2018); pt reports increased activty since starting PT, continues to have similar limitations to baseline (11/28/2018);; patient reports increased activity since strating PT, continues to have similar limitations to baseline but with slightly lower pain (12/09/2018); continues to have similar limitations but with less pain overall (01/06/2019); feels similar limitations but more better days than bad days now (02/26/2019).; continues to have difficulty picking up son (04/23/2019); continues to have difficulty picking up > 50# autistic son who throws tantrums, feels overall she is able to do more and manage her pain better (06/03/2019);    Time  6    Period  Weeks    Status  Partially Met    Target Date  07/15/19      PT LONG TERM GOAL #4   Title  Patient will increase BLE gross strength to 4+/5 as to improve functional strength for independent gait, increased standing tolerance and increased ADL ability.    Baseline  Hip: Abduction: R = 3+/5 painful , L = 4+/5 painful!. Hip Flexion: R = 3+/5, L = 4/5. Knee Flex: R = 4/5, L = 5/5. (11/04/2018); see objective exam (01/06/2019);  see objective exam (02/26/2019); did not measure formally (04/23/2019); BLE strength 5/5 except B hip extension painful and 3+/5 and left hip abduction is painful.    Time  6    Period  Weeks    Status  Partially Met    Target Date  07/15/19      PT LONG TERM GOAL #5   Title  Be able to squat to chair height with proper form without limitation due to current condition in order  to improve ability  to lift and complete transfers during usual activities.     Baseline  Difficulty with sit<>stand transfers due to pain but less than at initial eval (11/28/2018); improved sit <> stand transfers with reduced pain compared to initial eval (12/09/2018);; able to complete partial squat and improved sit to stand but continued pain (01/06/2019);  able to quat to chair height with mild discomfort (02/26/2019; 06/03/2019);    Time  6    Period  Weeks    Status  Achieved    Target Date  04/09/19      PT LONG TERM GOAL #6   Title  Demonstrate improved modified Oswestry Disability Index (mODI) score to equal or less than 20% to demonstrate improvement in overall condition and self-reported functional ability.     Baseline  50% (02/26/2019); 42% (04/23/2019); 28% (06/03/2019);    Time  6    Period  Weeks    Status  Partially Met    Target Date  07/15/19      PT LONG TERM GOAL #7   Title  Patient will be able to complete 5 back lunges with knee to floor and light UE support with good form to improve her activity tolerance for activities requiring assymetrical loading such as picking up her son.     Baseline  Back lunge: excessive forward bend, unable to touch knee to ground, unsteady, requires unilateral UE support (04/23/2019); able to complete 5 reps without UE support with difficulty controlling descent demonstrating weakness.    Time  6    Period  Weeks    Status  Partially Met    Target Date  07/15/19            Plan - 07/08/19 1540    Clinical Impression Statement  Patient able to complete exercises without overall increase in pain. Continues to have elevated pain and high motivation to continue exercise. Current visits are focused on honing long term HEP in preparation for discharge next visit. Patient continues to show decreased motor control and strength in the hips and core. HEP was adapted to be challenging, yet within her current skill level. Patient would benefit from continued physical  therapy to address remaining impairments and functional limitations to work towards stated goals and return to PLOF or maximal functional independence.    Personal Factors and Comorbidities  Time since onset of injury/illness/exacerbation    Examination-Activity Limitations  Sit;Transfers;Sleep;Bend;Lift;Squat;Locomotion Level;Stairs;Caring for Others;Stand;Carry    Examination-Participation Restrictions  Interpersonal Relationship;Yard Work;Cleaning;Laundry;Community Activity;Other;Driving;Meal Prep;Shop    Rehab Potential  Good    Clinical Impairments Affecting Rehab Potential  (+) denies leg symptoms; motivated; complient with HEP  (-) lack of readily responsive pain    PT Frequency  2x / week    PT Duration  6 weeks    PT Treatment/Interventions  ADLs/Self Care Home Management;Moist Heat;Cryotherapy;Functional mobility training;Stair training;Therapeutic activities;Therapeutic exercise;Balance training;Neuromuscular re-education;Patient/family education;Manual techniques;Passive range of motion;Dry needling;Spinal Manipulations;Joint Manipulations;Other (comment)    PT Next Visit Plan  prepare for self-management following discharge. Continue to hone HEP to be something acheivable by patient.    PT Home Exercise Plan  Medbridge  Access Code: [name] and Access Code: [email or web address]     Consulted and Agree with Plan of Care  Patient       Patient will benefit from skilled therapeutic intervention in order to improve the following deficits and impairments:  Decreased endurance, Decreased mobility, Difficulty walking, Hypomobility, Increased muscle spasms, Decreased range of motion, Impaired perceived functional ability,  Impaired tone, Obesity, Decreased activity tolerance, Decreased strength, Postural dysfunction, Pain, Impaired flexibility, Improper body mechanics, Decreased coordination  Visit Diagnosis: 1. Chronic bilateral low back pain without sciatica   2. Difficulty in walking, not elsewhere  classified        Problem List Patient Active Problem List   Diagnosis Date Noted  . Menorrhagia with regular cycle 03/11/2018  . Microcytic anemia 02/25/2018  . Iron deficiency anemia due to chronic blood loss 02/18/2018    Everlean Alstrom. Graylon Good, PT, DPT 07/08/19, 3:41 PM  [facility] [address] [place], [address] Phone: [phone]   Fax:  [phone]  Name: Cathy Trevino MRN: [id number] Date of Birth: 09/25/1986

## 2019-07-15 ENCOUNTER — Other Ambulatory Visit: Payer: Self-pay

## 2019-07-15 ENCOUNTER — Encounter: Payer: Self-pay | Admitting: Physical Therapy

## 2019-07-15 ENCOUNTER — Ambulatory Visit: Payer: Medicaid Other | Admitting: Physical Therapy

## 2019-07-15 DIAGNOSIS — M545 Low back pain: Secondary | ICD-10-CM | POA: Diagnosis not present

## 2019-07-15 DIAGNOSIS — G8929 Other chronic pain: Secondary | ICD-10-CM

## 2019-07-15 DIAGNOSIS — R262 Difficulty in walking, not elsewhere classified: Secondary | ICD-10-CM

## 2019-07-15 NOTE — Therapy (Signed)
[facility] [address], [place], [address] Phone: [phone]   Fax:  [phone]  Physical Therapy Treatment and Discharge Summary Reporting Period: 06/03/19 - 07/15/2019  Patient Details  Name: Cathy Trevino MRN: [id number] Date of Birth: February 08, 1986 Referring Provider (PT): Margurite Auerbach, NP-C   Encounter Date: 07/15/2019  PT End of Session - 07/15/19 1001    Visit Number  35    Number of Visits  35    Date for PT Re-Evaluation  07/15/19    Authorization Type  Medicaid reporting period from 06/03/2019; auth through 07/21/2019    Authorization Time Period  Current Certification period: 06/03/2019 - 12/27/3157 (last PN/Re-Cert 4/58/5929)    Authorization - Visit Number  6    Authorization - Number of Visits  6    PT Start Time  0950    PT Stop Time  1030    PT Time Calculation (min)  40 min    Activity Tolerance  Patient limited by fatigue;Patient limited by pain    Behavior During Therapy  [facility] for tasks assessed/performed       Past Medical History:  Diagnosis Date  . Anemia   . Anxiety   . Asthma   . Depression   . Menorrhagia     Past Surgical History:  Procedure Laterality Date  . CESAREAN SECTION  2012  . TUBAL LIGATION      There were no vitals filed for this visit.  Subjective Assessment - 07/15/19 0953    Subjective  Patient reports her pain is 6/10 today and continues to vary throughout time without regard for the activities she does. She has been working on her HEP as tolerated since last treatment session and "doing the best I can." States her pain seems to be gradually rising over the past few weeks despite her best efforts to stay active and follow rehab reccomendations. She continues to need to pick up her autistic son when he has tantrums. She has some stress around his care at this point. His doctors want to send him to the neurologist to check for any organic causes of his  behavior.    Pertinent History  Patient is a 33 y.o. female who presents to outpatient physical therapy with a referral for chronic low back pain. This patient's chief complaints consist of pain, leading to the following functional deficits: difficulty with all activities that require movement or motion including lifting, bending, prolonged positions, sleeping. Relevant past medical history and comorbidities include "musculoskeletal issue around heart" told to take tylenol when needed (sees cardiologist when he feels is necessary, last was July 2018), obesity, previous episodes of low back pain, history of depression and anxiety, anemia. Imaging: From MRI report dated 07/15/2018: "1. 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 2. Diffusely decreased T1 signal throughout the visualized bone marrow, nonspecific, but suspected to be related to patient's history of anemia. 3. Otherwise normal MRI of the lumbar spine."    Limitations  Sitting;Walking;Lifting;House hold activities;Standing    How long can you sit comfortably?  ~ 20 mins on a surface with a cushon, less than that on a wooden chair.    How long can you stand comfortably?  about 15 minutes    How long can you walk comfortably?  10 min    Diagnostic tests  MRI report dated 07/15/2018: "1. Central/left subarticular disc protrusion  at L5-S1 extending into the left lateral recess and impinging upon the descending left S1 nerve root. 2. Diffusely decreased T1 signal throughout the visualized bone marrow, nonspecific, but suspected to be related to patient's history of anemia. 3. Otherwise normal MRI of the lumbar spine."    Patient Stated Goals  Get rid of the pain so she can function better.     Currently in Pain?  Yes    Pain Score  6     Pain Location  Back    Pain Orientation  Posterior;Right    Pain Descriptors / Indicators  Constant    Pain  Radiating Towards  occasionally anterior R thigh, not present currently.    Pain Onset  More than a month ago    Pain Frequency  Constant    Aggravating Factors   any position too long, getting up in the morning, lifting son (>50#), sitting and standing. It is getting a lot harder to get up and down.    Pain Relieving Factors  heating pad, 3-sided pillow, tylenol    Effect of Pain on Daily Activities  States she feels things are gradually getting harder and harder. She is having difficulty getting up and down, lifting her autistic son who frequently throws himself on the floor (he is over 50#) and thows temper tantrums. Significantly interferes with ADLs, IADLs, caring for family, participating in usual activities. She is having difficulty sleeping         [facility] PT Assessment - 07/15/19 0001      Assessment   Medical Diagnosis  Chronic low back pain    Referring Provider (PT)  Margurite Auerbach, NP-C    Onset Date/Surgical Date  07/05/18    Next MD Visit  unknown    Prior Therapy  has been attending PT at current clinic since November 2019 with good improvements in strength and function, moderate improvements in pain      Precautions   Precautions  Other (comment)   no lifting over 5#     Restrictions   Weight Bearing Restrictions  No      Prior Function   Level of Independence  Independent    Vocation  Works at home    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  NEUROLOGICAL:  - BLE Myotomes WFL - BLE  Dermatomes equal and intact to light touch.  - DTR absent at bilateral quad and L achilles, 1+ at R achilles. - Negative for clonus at ankle, and Hoffman's and Babinski reflex bilaterally.   SPINE MOTION Lumbar AROM:  *Indicates pain  Flexion: = ankles,(abberant return, painful on R).  Extension: =75%, ERP   Rotation: R =100% ERP, L =100% ERP  Side Flexion: R = to patella(ERP! on R side), L =proximal to patella, (ERP on R side); R worse than L  PERIPHERAL JOINT MOTION (AROM/PROM in degrees):  *Indicates pain  BLE grossly WFL for basic mobility.   STRENGTH:   B hip flexion R =  4 painful, L= 4+/5  BLE WFL grossly 5/5 except R hip abduction 4/5 painful, R hip extension 3+/5 and painful with knee extended, R hip extension with knee flexed to 90 degrees 3/5 and painful, L hip extension with knee flexed to 90 degrees 4/5.   Able to heel walk and toe walk with bilaterally equal strength.   Demonstrates significant functional weakness in hip extension that limits single leg bridges, and lunges (see below).   FUNCTIONALTESTING: - Sit to stand in 30 seconds: x37mld pain (very painful) - squat: did not complete due to pain with sit <> stand - Back lunge: able to one each side with min A from UE to get up. Very painful. Discontinued. Difficulty controlling downward descent and initiating getting up from bottom of lunge.   FUNCTIONAL MOBILITY:  Bed mobility:supine <> sit I,with some discomfort.   Transfers:sit <> stand Iwith some discomfort.   Gait:I and WFL for household and community distances at least up to 2 mph for 2  minutes on 5% incline and 4 min up to 2 mph with no incline (as tested on treadmill). Reports limitations due to pain. Could not maintain 5% incline due to pain.   Objective measurements completed on examination: See above findings.     TREATMENT: Denies latex allergy.  Therapeutic exercise:to centralize symptoms and improve ROM  and strength required for successful completion of functional activities.  - Treadmill levelup to234m,5% grad for 2 min then 0% for remainder of walk.For improved lower extremity mobility, muscular endurance, and weightbearing activity tolerance; and to induce the analgesic effect of aerobic exercise, stimulate improved joint nutrition, and prepare body structures and systems for following interventions. x6Minutes during subjective exam. - Measurements of sensation, reflexes, strength, ROM, and function to assess progress (see above) Review and continued focus on exercises for long term HEP:  - variations on running man exercise focusing on breaking it down step by step to improve hip hinge and core and hip stability. Patient motivated to continue practicing and progressed to being able to complete x10 reps on each side with intermittant unilateral UE support.  - education on long term management and suggested things to discuss with doctor at upcoming visits.    HOME EXERCISE PROGRAM Access Code: [email or web address]  URL: [email or web address]  Date: 07/08/2019  Prepared by: SaRosita Kea Program Notes  Complete entire program 2-3 times per week.   Exercises   Plank with Thighs on [place] [employer] 3 reps - 30 seconds hold   Glute Bridge on [place] 2-3 sets - 10-15 reps - 1 second hold   Runner's Step up with Arms Forward - 2-3 sets - 10-15 reps - 1 second hold   Single Leg Running Balance - 2-3 sets - 10-15 reps   Single Leg Bridge - 2-3 sets - 5-10 reps - 1 second hold   Squat with Chair Touch - 2-3 sets - 10-15 reps   Hip Hikes off step - 2-3 sets - 10-15 reps     PT Education - 07/15/19 1000    Education Details  Exercise purpose/form. Self management techniques. Education on diagnosis, prognosis, POC, anatomy and physiology of current condition    Person(s) Educated  Patient    Methods  Explanation;Demonstration;Tactile cues;Verbal cues    Comprehension   Verbalized understanding;Returned demonstration       PT Short Term Goals - 06/03/19 1814      PT SHORT TERM GOAL #1   Title  Be independent with home exercise  program completed at least 3 times per week for self-management of symptoms.    Baseline  Initial HEP provided at initial eval (11/04/2018);    Time  2    Period  Weeks    Status  Achieved    Target Date  11/19/18      PT SHORT TERM GOAL #2   Title  Reduce pain to 8/10 with functional activities to allow patient to complete valued functional tasks such as sit to stand with less difficulty.    Baseline  reports 10/10 at initial evaluation (11/04/2018); 9.5/10 (11/28/2018); 8/10 at night while trying to sleep (12/09/2018); 5/10 (06/03/2019);    Time  2    Period  Weeks    Status  Achieved    Target Date  12/12/18      PT SHORT TERM GOAL #3   Title  Improve lumbar AROM to 75% to allow patient to complete valued activities with less difficulty.     Baseline  50% with end range pain at initial evaluaiton (11/04/2018);     Time  2    Period  Weeks    Status  Achieved    Target Date  11/19/18        PT Long Term Goals - 07/15/19 1313      PT LONG TERM GOAL #1   Title  Be independent with a long-term home exercise program for self-management of symptoms.     Baseline  Initial HEP provided at initial eval (11/04/2018); Initial HEP and progressions have been provided but not final progressions (11/28/2018); Initial HEP and progressions have been provided but not final progressions (12/09/2018; 01/06/2019; 02/26/2019; 04/23/2019; 06/03/2019); patient independent with long term HEP that is appropriate for her at this point    Time  6    Period  Weeks    Status  Achieved    Target Date  07/15/19      PT LONG TERM GOAL #2   Title  Demonstrate improved FOTO score by 10 units to demonstrate improvement in overall condition and self-reported functional ability.     Baseline  Foto = 47 (11/04/2018); too early to re-measure (11/28/2018);  not measured (12/09/2018); 44 (01/06/2019, 02/26/2019 - questionnaire is only asking one question, no longer valid, see new goal).     Time  6    Period  Weeks    Status  Deferred      PT LONG TERM GOAL #3   Title  Complete community, work and/or recreational activities without limitation due to current condition.     Baseline  limited in daily activities and responsibilities, limited to 5# of lifting, disrupts sleep. 5# weight lift limit (11/04/2018); pt reports increased activty since starting PT, continues to have similar limitations to baseline (11/28/2018);; patient reports increased activity since strating PT, continues to have similar limitations to baseline but with slightly lower pain (12/09/2018); continues to have similar limitations but with less pain overall (01/06/2019); feels similar limitations but more better days than bad days now (02/26/2019).; continues to have difficulty picking up son (04/23/2019); continues to have difficulty picking up > 50# autistic son who throws tantrums, feels overall she is able to do more and manage her pain better (06/03/2019); patient reports she continues to feel increasingly more limited (07/15/2019);    Time  6    Period  Weeks    Status  Not Met    Target Date  07/15/19      PT LONG TERM GOAL #4  Title  Patient will increase BLE gross strength to 4+/5 as to improve functional strength for independent gait, increased standing tolerance and increased ADL ability.    Baseline  Hip: Abduction: R = 3+/5 painful , L = 4+/5 painful!. Hip Flexion: R = 3+/5, L = 4/5. Knee Flex: R = 4/5, L = 5/5. (11/04/2018); see objective exam (01/06/2019);  see objective exam (02/26/2019); did not measure formally (04/23/2019); BLE strength 5/5 except B hip extension painful and 3+/5 and left hip abduction is painful.(06/03/2019); patient continues to have difficulty and weakness associated with pain for certain motions (8/87/5797);    Time  6    Period  Weeks    Status  Partially  Met    Target Date  07/15/19      PT LONG TERM GOAL #5   Title  Be able to squat to chair height with proper form without limitation due to current condition in order to improve ability to lift and complete transfers during usual activities.     Baseline  Difficulty with sit<>stand transfers due to pain but less than at initial eval (11/28/2018); improved sit <> stand transfers with reduced pain compared to initial eval (12/09/2018);; able to complete partial squat and improved sit to stand but continued pain (01/06/2019);  able to quat to chair height with mild discomfort (02/26/2019; 06/03/2019); able to complete chair height squat but very painful at this point, worse than previous assessments, but still able to complete one squat (07/15/2019);    Time  6    Period  Weeks    Status  Achieved    Target Date  04/09/19      PT LONG TERM GOAL #6   Title  Demonstrate improved modified Oswestry Disability Index (mODI) score to equal or less than 20% to demonstrate improvement in overall condition and self-reported functional ability.     Baseline  50% (02/26/2019); 42% (04/23/2019); 28% (06/03/2019); 50% (07/15/2019);    Time  6    Period  Weeks    Status  Not Met    Target Date  07/15/19      PT LONG TERM GOAL #7   Title  Patient will be able to complete 5 back lunges with knee to floor and light UE support with good form to improve her activity tolerance for activities requiring assymetrical loading such as picking up her son.     Baseline  Back lunge: excessive forward bend, unable to touch knee to ground, unsteady, requires unilateral UE support (04/23/2019); able to complete 5 reps without UE support with difficulty controlling descent demonstrating weakness. (06/03/2019); able to complete one rep each side with min unilateral UE support, very painful so discontinued (07/15/2019);    Time  6    Period  Weeks    Status  Not Met    Target Date  07/15/19            Plan - 07/15/19 1324     Clinical Impression Statement  Patient has attended 26 skilled physical therapy treatment sessions this episode of care. Over the course of her treatment, she experienced several interruptions in care including clinic closure due to COVID19 precautions and delays in insurance authorization. Patient has remained motivated with excellent participation throughout treatment and was able to attend steadily for the last 6 weeks. Her progress has varied over the course of her treatment and she has experienced fluctuations in the severity of her condition seemingly independent of her physical demands and treatment in physical  therapy. Patient made good overall progress in strength and activity tolerance, and her pain had improved significantly since initial eval. Over the last 6 weeks, patient's pain has gradually increased and her activity tolerance has decreased. Her Modified Oswestry Index has worsened from 28% to 50% suggesting a clinically significant worsening of self-reported function. Throughout treatment, patient's condition has responded poorly (with increased pain) to manual techniques and end range stretching. Patient appears to have maximized the benefits of physical therapy at this point and is now being discharged due to worsening of her condition and lack of progress. She has a good grasp of her HEP for long term use. It is recommended that patient return to her physician for further medical assessment at this point.    Personal Factors and Comorbidities  Time since onset of injury/illness/exacerbation    Examination-Activity Limitations  Sit;Transfers;Sleep;Bend;Lift;Squat;Locomotion Level;Stairs;Caring for Others;Stand;Carry    Examination-Participation Restrictions  Interpersonal Relationship;Yard Work;Cleaning;Laundry;Community Activity;Other;Driving;Meal Prep;Shop    Rehab Potential  Good    Clinical Impairments Affecting Rehab Potential  (+) denies leg symptoms; motivated; complient with HEP  (-)  lack of readily responsive pain    PT Frequency  2x / week    PT Duration  6 weeks    PT Treatment/Interventions  ADLs/Self Care Home Management;Moist Heat;Cryotherapy;Functional mobility training;Stair training;Therapeutic activities;Therapeutic exercise;Balance training;Neuromuscular re-education;Patient/family education;Manual techniques;Passive range of motion;Dry needling;Spinal Manipulations;Joint Manipulations;Other (comment)    PT Next Visit Plan  prepare for self-management following discharge. Continue to hone HEP to be something acheivable by patient.    PT Home Exercise Plan  Medbridge  Access Code: [facility] and Access Code: [email or web address]     Consulted and Agree with Plan of Care  Patient       Patient will benefit from skilled therapeutic intervention in order to improve the following deficits and impairments:  Decreased endurance, Decreased mobility, Difficulty walking, Hypomobility, Increased muscle spasms, Decreased range of motion, Impaired perceived functional ability, Impaired tone, Obesity, Decreased activity tolerance, Decreased strength, Postural dysfunction, Pain, Impaired flexibility, Improper body mechanics, Decreased coordination  Visit Diagnosis: 1. Chronic bilateral low back pain without sciatica   2. Difficulty in walking, not elsewhere classified        Problem List Patient Active Problem List   Diagnosis Date Noted  . Menorrhagia with regular cycle 03/11/2018  . Microcytic anemia 02/25/2018  . Iron deficiency anemia due to chronic blood loss 02/18/2018    Everlean Alstrom. Graylon Good, PT, DPT 07/15/19, 1:26 PM  [facility] [address], [place], [address] Phone: [phone]   Fax:  [phone]  Name: Cathy Trevino MRN: [id number] Date of Birth: 1986/07/13

## 2019-08-05 ENCOUNTER — Encounter (INDEPENDENT_AMBULATORY_CARE_PROVIDER_SITE_OTHER): Payer: Self-pay | Admitting: Vascular Surgery

## 2019-08-05 ENCOUNTER — Other Ambulatory Visit: Payer: Self-pay

## 2019-08-05 ENCOUNTER — Ambulatory Visit (INDEPENDENT_AMBULATORY_CARE_PROVIDER_SITE_OTHER): Payer: Medicaid Other | Admitting: Vascular Surgery

## 2019-08-05 DIAGNOSIS — I83813 Varicose veins of bilateral lower extremities with pain: Secondary | ICD-10-CM

## 2019-08-05 NOTE — Patient Instructions (Signed)
Varicose Veins Varicose veins are veins that have become enlarged, bulged, and twisted. They most often appear in the legs. What are the causes? This condition is caused by damage to the valves in the vein. These valves help blood return to your heart. When they are damaged and they stop working properly, blood may flow backward and back up in the veins near the skin, causing the veins to get larger and appear twisted. The condition can result from any issue that causes blood to back up, like pregnancy, prolonged standing, or obesity. What increases the risk? This condition is more likely to develop in people who are:  On their feet a lot.  Pregnant.  Overweight. What are the signs or symptoms? Symptoms of this condition include:  Bulging, twisted, and bluish veins.  A feeling of heaviness. This may be worse at the end of the day.  Leg pain. This may be worse at the end of the day.  Swelling in the leg.  Changes in skin color over the veins. How is this diagnosed? This condition may be diagnosed based on your symptoms, a physical exam, and an ultrasound test. How is this treated? Treatment for this condition may involve:  Avoiding sitting or standing in one position for long periods of time.  Wearing compression stockings. These stockings help to prevent blood clots and reduce swelling in the legs.  Raising (elevating) the legs when resting.  Losing weight.  Exercising regularly. If you have persistent symptoms or want to improve the way your varicose veins look, you may choose to have a procedure to close the varicose veins off or to remove them. Treatments to close off the veins include:  Sclerotherapy. In this treatment, a solution is injected into a vein to close it off.  Laser treatment. In this treatment, the vein is heated with a laser to close it off.  Radiofrequency vein ablation. In this treatment, an electrical current produced by radio waves is used to close  off the vein. Treatments to remove the veins include:  Phlebectomy. In this treatment, the veins are removed through small incisions made over the veins.  Vein ligation and stripping. In this treatment, incisions are made over the veins. The veins are then removed after being tied (ligated) with stitches (sutures). Follow these instructions at home: Activity  Walk as much as possible. Walking increases blood flow. This helps blood return to the heart and takes pressure off your veins. It also increases your cardiovascular strength.  Follow your health care provider's instructions about exercising.  Do not stand or sit in one position for a long period of time.  Do not sit with your legs crossed.  Rest with your legs raised during the day. General instructions   Follow any diet instructions given to you by your health care provider.  Wear compression stockings as directed by your health care provider. Do not wear other kinds of tight clothing around your legs, pelvis, or waist.  Elevate your legs at night to above the level of your heart.  If you get a cut in the skin over the varicose vein and the vein bleeds: ? Lie down with your leg raised. ? Apply firm pressure to the cut with a clean cloth until the bleeding stops. ? Place a bandage (dressing) on the cut. Contact a health care provider if:  The skin around your varicose veins starts to break down.  You have pain, redness, tenderness, or hard swelling over a vein.  You   are uncomfortable because of pain.  You get a cut in the skin over a varicose vein and it will not stop bleeding. Summary  Varicose veins are veins that have become enlarged, bulged, and twisted. They most often appear in the legs.  This condition is caused by damage to the valves in the vein. These valves help blood return to your heart.  Treatment for this condition includes frequent movements, wearing compression stockings, losing weight, and  exercising regularly. In some cases, procedures are done to close off or remove the veins.  Treatment for this condition may include wearing compression stockings, elevating the legs, losing weight, and engaging in regular activity. In some cases, procedures are done to close off or remove the veins. This information is not intended to replace advice given to you by your health care provider. Make sure you discuss any questions you have with your health care provider. Document Released: 09/13/2005 Document Revised: 01/30/2019 Document Reviewed: 12/27/2016 Elsevier Patient Education  2020 [employer]  

## 2019-08-05 NOTE — Progress Notes (Signed)
Patient ID: Cathy BombardJennifer M Trevino, female   DOB: December 09, 1986, 33 y.o.   MRN: [id number]  Chief Complaint  Patient presents with  . New Patient (Initial Visit)    ref Karma GreaserBoswell for varicose veins    HPI Cathy BombardJennifer M Dupriest is a 33 y.o. female.  I am asked to see the patient by C. Boswell for evaluation of painful varicose veins.  The patient presents with complaints of symptomatic varicosities of the lower extremities. The patient reports a long standing history of varicosities and they have become painful over time. There was no clear inciting event or causative factor that started the symptoms.  The left leg is more severly affected. The patient elevates the legs for relief. The pain is described as itching, stinging, and burning primarily overlying the varicosities themselves. The symptoms are generally most severe in the evening, particularly when they have been on their feet for long periods of time.  Elevation has been used to try to improve the symptoms with limited success. The patient complains of infrequent swelling as an associated symptom. The patient has no previous history of deep venous thrombosis or superficial thrombophlebitis to their knowledge.     Past Medical History:  Diagnosis Date  . Anemia   . Anxiety   . Asthma   . Depression   . Menorrhagia     Past Surgical History:  Procedure Laterality Date  . CESAREAN SECTION  2012  . TUBAL LIGATION      Family History  Problem Relation Age of Onset  . Heart failure Mother   . Hypertension Mother   no bleeding or clotting disorders Mother and maternal aunt with varicose veins  Social History Social History   Tobacco Use  . Smoking status: Former Smoker    Packs/day: 0.25    Years: 5.00    Pack years: 1.25    Types: Cigarettes    Quit date: 02/22/2016    Years since quitting: 3.4  . Smokeless tobacco: Never Used  Substance Use Topics  . Alcohol use: No  . Drug use: No     Allergies  Allergen Reactions  .  Penicillins Rash    Current Outpatient Medications  Medication Sig Dispense Refill  . acetaminophen (TYLENOL) 500 MG tablet Take 500 mg by mouth every 6 (six) hours as needed.    . ergocalciferol (VITAMIN D2) 1.25 MG (50000 UT) capsule Take 50,000 Units by mouth once a week.    . fluticasone (FLONASE) 50 MCG/ACT nasal spray Place into both nostrils daily.    Marland Kitchen. levonorgestrel (MIRENA) 20 MCG/24HR IUD 1 each by Intrauterine route once.    . loratadine (CLARITIN) 10 MG tablet Take 1 tablet (10 mg total) by mouth daily. 30 tablet 2  . tiZANidine (ZANAFLEX) 2 MG tablet Take by mouth daily as needed for muscle spasms (at bedtime).     No current facility-administered medications for this visit.       REVIEW OF SYSTEMS (Negative unless checked)  Constitutional: [] Weight loss  [] Fever  [] Chills Cardiac: [] Chest pain   [] Chest pressure   [] Palpitations   [] Shortness of breath when laying flat   [] Shortness of breath at rest   [] Shortness of breath with exertion. Vascular:  [] Pain in legs with walking   [] Pain in legs at rest   [] Pain in legs when laying flat   [] Claudication   [] Pain in feet when walking  [] Pain in feet at rest  [] Pain in feet when laying flat   [] History of  DVT   [] Phlebitis   [] Swelling in legs   [x] Varicose veins   [] Non-healing ulcers Pulmonary:   [] Uses home oxygen   [] Productive cough   [] Hemoptysis   [] Wheeze  [] COPD   [x] Asthma Neurologic:  [] Dizziness  [] Blackouts   [] Seizures   [] History of stroke   [] History of TIA  [] Aphasia   [] Temporary blindness   [] Dysphagia   [] Weakness or numbness in arms   [] Weakness or numbness in legs Musculoskeletal:  [] Arthritis   [] Joint swelling   [] Joint pain   [] Low back pain Hematologic:  [] Easy bruising  [] Easy bleeding   [] Hypercoagulable state   [x] Anemic  [] Hepatitis Gastrointestinal:  [] Blood in stool   [] Vomiting blood  [] Gastroesophageal reflux/heartburn   [] Abdominal pain Genitourinary:  [] Chronic kidney disease   [] Difficult  urination  [] Frequent urination  [] Burning with urination   [] Hematuria Skin:  [] Rashes   [] Ulcers   [] Wounds Psychological:  [x] History of anxiety   [x]  History of major depression.    Physical Exam BP 112/76 (BP Location: Right Arm)   Pulse 76   Resp 16   Ht 5\' 4"  (1.626 m)   Wt 247 lb (112 kg)   BMI 42.40 kg/m  Gen:  WD/WN, NAD Head: [place]/AT, No temporalis wasting.  Ear/Nose/Throat: Hearing grossly intact, dentition good Eyes: Sclera non-icteric. Conjunctiva clear Neck: Supple. Trachea midline Pulmonary:  Good air movement, no use of accessory muscles, respirations not labored.  Cardiac: RRR, No JVD Vascular: Varicosities scattered and measuring up to 1-2 mm in the right lower extremity        Varicosities scattered and measuring up to 1-2 mm in the left lower extremity Vessel Right Left  Radial Palpable Palpable                          PT Palpable Palpable  DP Palpable Palpable   Gastrointestinal: soft, non-tender/non-distended.  Musculoskeletal: M/S 5/5 throughout. No significant LE edema Neurologic: Sensation grossly intact in extremities.  Symmetrical.  Speech is fluent.  Psychiatric: Judgment intact, Mood & affect appropriate for pt's clinical situation. Dermatologic: No rashes or ulcers noted.  No cellulitis or open wounds.    Radiology No results found.  Labs Recent Results (from the past 2160 hour(s))  Ferritin     Status: None   Collection Time: 06/10/19 10:10 AM  Result Value Ref Range   Ferritin 35 11 - 307 ng/mL    Comment: Performed at [facility], [address] [place], [place] [address]  Iron and TIBC     Status: None   Collection Time: 06/10/19 10:10 AM  Result Value Ref Range   Iron 87 28 - 170 ug/dL   TIBC 956326 387250 - 564450 ug/dL   Saturation Ratios 27 10.4 - 31.8 %   UIBC 239 ug/dL    Comment: Performed at [facility], [address] [place], [place] [address]  CBC with Differential/Platelet     Status: None    Collection Time: 06/10/19 10:10 AM  Result Value Ref Range   WBC 6.5 4.0 - 10.5 K/uL   RBC 4.72 3.87 - 5.11 MIL/uL   Hemoglobin 14.1 12.0 - 15.0 g/dL   HCT 18.842.3 41.636.0 - 60.646.0 %   MCV 89.6 80.0 - 100.0 fL   MCH 29.9 26.0 - 34.0 pg   MCHC 33.3 30.0 - 36.0 g/dL   RDW 30.112.7 60.111.5 - 09.315.5 %   Platelets 217 150 - 400 K/uL   nRBC 0.0  0.0 - 0.2 %   Neutrophils Relative % 66 %   Neutro Abs 4.3 1.7 - 7.7 K/uL   Lymphocytes Relative 24 %   Lymphs Abs 1.6 0.7 - 4.0 K/uL   Monocytes Relative 6 %   Monocytes Absolute 0.4 0.1 - 1.0 K/uL   Eosinophils Relative 3 %   Eosinophils Absolute 0.2 0.0 - 0.5 K/uL   Basophils Relative 1 %   Basophils Absolute 0.1 0.0 - 0.1 K/uL   Immature Granulocytes 0 %   Abs Immature Granulocytes 0.02 0.00 - 0.07 K/uL    Comment: Performed at [facility], [address] [place], [place] [address]    Assessment/Plan:  Varicose veins of leg with pain, bilateral   The patient has symptoms consistent with chronic venous insufficiency. We discussed the natural history and treatment options for venous disease. I recommended the regular use of 20 - 30 mm Hg compression stockings, and prescribed these today. I recommended leg elevation and anti-inflammatories as needed for pain. I have also recommended a complete venous duplex to assess the venous system for reflux or thrombotic issues. This can be done at the patient's convenience. I will see the patient back in 3 months to assess the response to conservative management, and determine further treatment options.     Leotis Pain 08/05/2019, 10:53 AM   This note was created with Dragon medical transcription system.  Any errors from dictation are unintentional.

## 2019-11-06 ENCOUNTER — Encounter (INDEPENDENT_AMBULATORY_CARE_PROVIDER_SITE_OTHER): Payer: Self-pay | Admitting: Nurse Practitioner

## 2019-11-06 ENCOUNTER — Ambulatory Visit (INDEPENDENT_AMBULATORY_CARE_PROVIDER_SITE_OTHER): Payer: Medicaid Other | Admitting: Nurse Practitioner

## 2019-11-06 ENCOUNTER — Encounter (INDEPENDENT_AMBULATORY_CARE_PROVIDER_SITE_OTHER): Payer: Self-pay

## 2019-11-06 ENCOUNTER — Other Ambulatory Visit: Payer: Self-pay

## 2019-11-06 ENCOUNTER — Ambulatory Visit (INDEPENDENT_AMBULATORY_CARE_PROVIDER_SITE_OTHER): Payer: Medicaid Other

## 2019-11-06 VITALS — BP 107/73 | HR 83 | Resp 18 | Ht 64.0 in | Wt 249.0 lb

## 2019-11-06 DIAGNOSIS — I83813 Varicose veins of bilateral lower extremities with pain: Secondary | ICD-10-CM

## 2019-11-06 NOTE — Progress Notes (Signed)
SUBJECTIVE:  Patient ID: Cathy Trevino, female    DOB: [birth year], 33 y.o.   MRN: [id number] Chief Complaint  Patient presents with  . Follow-up    HPI  Cathy Trevino is a 33 y.o. female The patient returns for followup evaluation 3 months after the initial visit. The patient continues to have pain in the lower extremities with dependency. The pain is lessened with elevation. Graduated compression stockings, Class I (20-30 mmHg), have been worn but the stockings do not eliminate the leg pain. Over-the-counter analgesics do not improve the symptoms. The degree of discomfort continues to interfere with daily activities. The patient notes the pain in the legs is causing problems with daily exercise, at the workplace and even with household activities and maintenance such as standing in the kitchen preparing meals and doing dishes.   Venous ultrasound shows normal deep venous system, no evidence of acute or chronic DVT.  Superficial reflux is not present.   Past Medical History:  Diagnosis Date  . Anemia   . Anxiety   . Asthma   . Depression   . Menorrhagia     Past Surgical History:  Procedure Laterality Date  . CESAREAN SECTION  2012  . TUBAL LIGATION      Social History   Socioeconomic History  . Marital status: Single    Spouse name: Not on file  . Number of children: Not on file  . Years of education: Not on file  . Highest education level: Not on file  Occupational History  . Not on file  Social Needs  . Financial resource strain: Not on file  . Food insecurity    Worry: Not on file    Inability: Not on file  . Transportation needs    Medical: Not on file    Non-medical: Not on file  Tobacco Use  . Smoking status: Former Smoker    Packs/day: 0.25    Years: 5.00    Pack years: 1.25    Types: Cigarettes    Quit date: 02/22/2016    Years since quitting: 3.7  . Smokeless tobacco: Never Used  Substance and Sexual Activity  . Alcohol use: No  . Drug use: No   . Sexual activity: Yes    Birth control/protection: I.U.D.  Lifestyle  . Physical activity    Days per week: 7 days    Minutes per session: 40 min  . Stress: Not on file  Relationships  . Social Herbalist on phone: Not on file    Gets together: Not on file    Attends religious service: Not on file    Active member of club or organization: Not on file    Attends meetings of clubs or organizations: Not on file    Relationship status: Not on file  . Intimate partner violence    Fear of current or ex partner: Not on file    Emotionally abused: Not on file    Physically abused: Not on file    Forced sexual activity: Not on file  Other Topics Concern  . Not on file  Social History Narrative  . Not on file    Family History  Problem Relation Age of Onset  . Heart failure Mother   . Hypertension Mother     Allergies  Allergen Reactions  . Penicillins Rash     Review of Systems   Review of Systems: Negative Unless Checked Constitutional: [] Weight loss  [] Fever  [] Chills Cardiac: []   Chest pain    Atrial Fibrillation  Palpitations   Shortness of breath when laying flat   Shortness of breath with exertion. Shortness of breath at rest Vascular:  Pain in legs with walking   Pain in legs with standing Pain in legs when laying flat   Claudication    Pain in feet when laying flat    History of DVT   Phlebitis   Swelling in legs   Varicose veins   Non-healing ulcers Pulmonary:   Uses home oxygen   Productive cough   Hemoptysis   Wheeze  COPD   Asthma Neurologic:  Dizziness   Seizures  Blackouts History of stroke   History of TIA  Aphasia   Temporary Blindness   Weakness or numbness in arm   Weakness or numbness in leg Musculoskeletal:   Joint swelling   Joint pain   Low back pain   History of Knee Replacement Arthritis back Surgeries   Spinal Stenosis    Hematologic:  Easy bruising  Easy bleeding    Hypercoagulable state   Anemic Gastrointestinal:  Diarrhea   Vomiting  Gastroesophageal reflux/heartburn   Difficulty swallowing. Abdominal pain Genitourinary:  Chronic kidney disease   Difficult urination  Anuric   Blood in urine Frequent urination  Burning with urination   Hematuria Skin:  Rashes   Ulcers Wounds Psychological:  History of anxiety    History of major depression   Memory Difficulties      OBJECTIVE:   Physical Exam  BP 107/73 (BP Location: Right Arm)   Pulse 83   Resp 18   Ht  (1.626 m)   Wt 249 lb (112.9 kg)   BMI 42.74 kg/m   Gen: WD/WN, NAD Head: [place]/AT, No temporalis wasting.  Ear/Nose/Throat: Hearing grossly intact, nares w/o erythema or drainage Eyes: PER, EOMI, sclera nonicteric.  Neck: Supple, no masses.  No JVD.  Pulmonary:  Good air movement, no use of accessory muscles.  Cardiac: RRR Vascular: scattered spider veins Vessel Right Left  Radial Palpable Palpable  Dorsalis Pedis Palpable Palpable  Posterior Tibial Palpable Palpable   Gastrointestinal: soft, non-distended. No guarding/no peritoneal signs.  Musculoskeletal: M/S 5/5 throughout.  No deformity or atrophy.  Neurologic: Pain and light touch intact in extremities.  Symmetrical.  Speech is fluent. Motor exam as listed above. Psychiatric: Judgment intact, Mood & affect appropriate for pt's clinical situation. Dermatologic: No Venous rashes. No Ulcers Noted.  No changes consistent with cellulitis. Lymph : No Cervical lymphadenopathy, no lichenification or skin changes of chronic lymphedema.       ASSESSMENT AND PLAN:  1. Varicose veins of leg with pain, bilateral Recommend:  The patient is complaining of varicose veins.    I have had a long discussion with the patient regarding  varicose veins and why they cause symptoms.  Patient will begin wearing graduated compression stockings on a daily basis, beginning first thing in the morning and  removing them in the evening. The patient is instructed specifically not to sleep in the stockings.    The patient  will also begin using over-the-counter analgesics such as Motrin 600 mg po TID to help control the symptoms as needed.    In addition, behavioral modification including elevation during the day will be initiated, utilizing a recliner was recommended.  The patient is also instructed to continue exercising such as walking 4-5 times per week.  At this time the patient wishes to continue conservative therapy and is not interested in more invasive  treatments such as laser ablation and sclerotherapy.  The Patient will follow up PRN if the symptoms worsen.   Current Outpatient Medications on File Prior to Visit  Medication Sig Dispense Refill  . acetaminophen (TYLENOL) 500 MG tablet Take 500 mg by mouth every 6 (six) hours as needed.    . ergocalciferol (VITAMIN D2) 1.25 MG (50000 UT) capsule Take 50,000 Units by mouth once a week.    . fluticasone (FLONASE) 50 MCG/ACT nasal spray Place into both nostrils daily.    Marland Kitchen levonorgestrel (MIRENA) 20 MCG/24HR IUD 1 each by Intrauterine route once.    Marland Kitchen tiZANidine (ZANAFLEX) 2 MG tablet Take by mouth daily as needed for muscle spasms (at bedtime).     No current facility-administered medications on file prior to visit.     There are no Patient Instructions on file for this visit. No follow-ups on file.   Georgiana Spinner, NP  This note was completed with Office manager.  Any errors are purely unintentional.

## 2020-04-07 ENCOUNTER — Ambulatory Visit: Payer: Medicaid Other | Admitting: Neurology

## 2020-04-07 ENCOUNTER — Encounter: Payer: Self-pay | Admitting: Neurology

## 2020-04-07 ENCOUNTER — Other Ambulatory Visit: Payer: Self-pay

## 2020-04-07 VITALS — BP 126/82 | HR 82 | Temp 97.5°F | Ht 64.0 in | Wt 252.0 lb

## 2020-04-07 DIAGNOSIS — R251 Tremor, unspecified: Secondary | ICD-10-CM | POA: Diagnosis not present

## 2020-04-07 NOTE — Patient Instructions (Signed)
You have no obvious tremor on exam today. Your neurological exam is normal, which is reassuring, in particular, I do not see any signs or symptoms of parkinson's like disease or what we call parkinsonism.   For your tremor, I would not recommend any new medications.   Please remember, that any kind of tremor may be exacerbated by anxiety, anger, nervousness, excitement, dehydration, sleep deprivation, by caffeine, and low blood sugar values or blood sugar fluctuations and thyroid disease.   Please try to hydrate well with water, 6 to 8 cups/day are recommended, 8 ounces each, try to rest well, 7 to 8 hours of sleep are recommended for the average adult each night, and limit caffeine to 1 or 2 servings per day.  You can follow-up with Meryl Crutch, your primary nurse practitioner.

## 2020-04-07 NOTE — Progress Notes (Signed)
Subjective:    Patient ID: Cathy Trevino is a 34 y.o. female.  HPI     Star Age, MD, PhD [facility] [address]O. [place], [place] [address]  Dear Donnel Saxon,   I saw your patient, Cathy Trevino, upon a kind request in my neurologic clinic today for initial consultation of her tremor.  The patient is unaccompanied today.  As you know, Cathy Trevino is a 34 year old right-handed woman with an underlying medical history of OSA on CPAP, depression, anxiety, asthma, anemia, vitamin D deficiency, vitamin B12 deficiency, hyperlipidemia, lumbar disc disease, and morbid obesity with a BMI of over 40, who reports a approximately 2-68-month history of tremors affecting her right upper and lower extremities intermittently, more so in the right upper extremity, no triggers, no alleviating factors, can last a few minutes, up to 3 minutes at a time, may happen every day.  She has not had any recent medication changes, denies a family history of tremors or Parkinson's disease.  Is not particularly bothered by the tremor.  She denies any sudden onset of one-sided weakness or numbness or tingling or droopy face or slurring of speech, denies visual symptoms such as blurry vision, loss of vision, or double vision.  Denies recurrent headaches.  Had an eye examination in February 2021 and had a change in her eyeglass prescription, got new glasses which she uses for reading or driving at night.  She is divorced, lives with her 3 children, is a non-smoker and does not drink alcohol, drinks caffeine in the form of soda, 2 cans/day, drinks about 3 bottles of water per day.  She sleeps well with her CPAP. I reviewed your office note from 03/31/2020.  She had blood work through your office on 02/03/2020 and I reviewed the results: Creatinine 0.69, BUN 10, otherwise CMP unremarkable, total cholesterol 155, triglycerides 138, LDL 95.  Thyroid function showed normal TSH at 1.92, free T3  normal, free T4 normal.  Vitamin B12 level was 332 at the time.  Her Past Medical History Is Significant For: Past Medical History:  Diagnosis Date  . Anemia   . Anxiety   . Asthma   . Depression   . Menorrhagia     Her Past Surgical History Is Significant For: Past Surgical History:  Procedure Laterality Date  . CESAREAN SECTION  2012  . TUBAL LIGATION      Her Family History Is Significant For: Family History  Problem Relation Age of Onset  . Heart failure Mother   . Hypertension Mother     Her Social History Is Significant For: Social History   Socioeconomic History  . Marital status: Single    Spouse name: Not on file  . Number of children: Not on file  . Years of education: Not on file  . Highest education level: Not on file  Occupational History  . Not on file  Tobacco Use  . Smoking status: Former Smoker    Packs/day: 0.25    Years: 5.00    Pack years: 1.25    Types: Cigarettes    Quit date: 02/22/2016    Years since quitting: 4.1  . Smokeless tobacco: Never Used  Substance and Sexual Activity  . Alcohol use: No  . Drug use: No  . Sexual activity: Yes    Birth control/protection: I.U.D.  Other Topics Concern  . Not on file  Social History Narrative  . Not on file   Social Determinants of Health  Financial Resource Strain:   . Difficulty of Paying Living Expenses:   Food Insecurity:   . Worried About Programme researcher, broadcasting/film/video in the Last Year:   . Barista in the Last Year:   Transportation Needs:   . Freight forwarder (Medical):   Marland Kitchen Lack of Transportation (Non-Medical):   Physical Activity:   . Days of Exercise per Week:   . Minutes of Exercise per Session:   Stress:   . Feeling of Stress :   Social Connections:   . Frequency of Communication with Friends and Family:   . Frequency of Social Gatherings with Friends and Family:   . Attends Religious Services:   . Active Member of Clubs or Organizations:   . Attends Tax inspector Meetings:   Marland Kitchen Marital Status:     Her Allergies Are:  Allergies  Allergen Reactions  . Penicillins Rash  :   Her Current Medications Are:  Outpatient Encounter Medications as of 04/07/2020  Medication Sig  . acetaminophen (TYLENOL) 500 MG tablet Take 500 mg by mouth every 6 (six) hours as needed.  . celecoxib (CELEBREX) 100 MG capsule Celecoxib 100 MG Oral Capsule QTY: 30 capsule Days: 30 Refills: 2  Written: 02/10/20 Patient Instructions: Take 1 capsule by mouth daily for arthritis, large glass of water and small snack  . cetirizine (ZYRTEC) 10 MG tablet Cetirizine HCl 10 MG Oral Tablet QTY: 30 tablet Days: 30 Refills: 3  Written: 02/10/20 Patient Instructions: Take 1 tablet by mouth nightly at bedtime as needed for seasonal allergies  . diclofenac Sodium (VOLTAREN) 1 % GEL Apply topically 4 (four) times daily.  . ergocalciferol (VITAMIN D2) 1.25 MG (50000 UT) capsule Take 50,000 Units by mouth once a week.  . fluticasone (FLONASE) 50 MCG/ACT nasal spray Place into both nostrils daily.  Marland Kitchen levonorgestrel (MIRENA) 20 MCG/24HR IUD 1 each by Intrauterine route once.  . NON FORMULARY Allergy shots weekly  . tiZANidine (ZANAFLEX) 2 MG tablet Take by mouth daily as needed for muscle spasms (at bedtime).  Marland Kitchen VITAMIN B1-B12 IM Inject into the muscle every 30 (thirty) days.   No facility-administered encounter medications on file as of 04/07/2020.  :   Review of Systems:  Out of a complete 14 point review of systems, all are reviewed and negative with the exception of these symptoms as listed below: Review of Systems  Neurological:       Here for evaluation up right hand and leg tremor. Pt reports tremor is intermittent and has been present over the last year     Objective:  Neurological Exam  Physical Exam Physical Examination:   Vitals:   04/07/20 1052  BP: 126/82  Pulse: 82  Temp: (!) 97.5 F (36.4 C)    General Examination: The patient is a very pleasant 34 y.o.  female in no acute distress. She appears well-developed and well-nourished and well groomed.   HEENT: Normocephalic, atraumatic, pupils are equal, round and reactive to light and accommodation. Funduscopic exam is normal with sharp disc margins noted. Extraocular tracking is good without limitation to gaze excursion or nystagmus noted. Normal smooth pursuit is noted. Hearing is grossly intact. Face is symmetric with normal facial animation and normal facial sensation. Speech is clear with no dysarthria noted. There is no hypophonia. There is no lip, neck/head, jaw or voice tremor. Neck is supple with full range of passive and active motion. There are no carotid bruits on auscultation. Oropharynx exam reveals: moderate mouth  dryness, marginal dental hygiene. Tongue protrudes centrally and palate elevates symmetrically. Tonsils are 2+ in size.   Chest: Clear to auscultation without wheezing, rhonchi or crackles noted.  Heart: S1+S2+0, regular and normal without murmurs, rubs or gallops noted.   Abdomen: Soft, non-tender and non-distended with normal bowel sounds appreciated on auscultation.  Extremities: There is no pitting edema in the distal lower extremities bilaterally. Pedal pulses are intact.  Skin: Warm and dry without trophic changes noted. There are no varicose veins.  Musculoskeletal: exam reveals no obvious joint deformities, tenderness or joint swelling or erythema.   Neurologically:  Mental status: The patient is awake, alert and oriented in all 4 spheres. Her immediate and remote memory, attention, language skills and fund of knowledge are appropriate. There is no evidence of aphasia, agnosia, apraxia or anomia. Speech is clear with normal prosody and enunciation. Thought process is linear. Mood is normal and affect is normal.  Cranial nerves II - XII are as described above under HEENT exam. In addition: shoulder shrug is normal with equal shoulder height noted. Motor exam: Normal  bulk, strength and tone is noted. There is no drift, resting tremor or rebound.  On 04/07/2020: On Archimedes spiral drawing there is no trembling with the right hand and slight insecurity with her nondominant, left hand but no tremor, handwriting is legible, not tremulous, not micrographic.  She has no postural or action tremor in the upper extremities, no intention tremor.  Romberg is negative. Reflexes are 2+ throughout. Babinski: Toes are flexor bilaterally. Fine motor skills and coordination: intact with normal finger taps, normal hand movements, normal rapid alternating patting, normal foot taps and normal foot agility.  Cerebellar testing: No dysmetria or intention tremor on finger to nose testing. Heel to shin is unremarkable bilaterally. There is no truncal or gait ataxia.  Sensory exam: intact to light touch, vibration, temperature sense in the upper and lower extremities.  Gait, station and balance: She stands easily. No veering to one side is noted. No leaning to one side is noted. Posture is age-appropriate and stance is narrow based. Gait shows normal stride length and normal pace. No problems turning are noted. Tandem walk is unremarkable.  Assessment and Plan:   In summary, Cathy Trevino is a very pleasant 34 y.o.-year old female with an underlying medical history of OSA on CPAP, depression, anxiety, asthma, anemia, vitamin D deficiency, vitamin B12 deficiency, hyperlipidemia, lumbar disc disease, and morbid obesity with a BMI of over 40, who presents for evaluation of her right upper extremity and right lower extremity tremors.  She reports an intermittent tremor for the past 2 or 3 months.  Exam is benign, no obvious tremor, no parkinsonism, history not suggestive of essential tremor or Parkinson's disease.  She is largely reassured.  We talked about potential tremor triggers including anxiety, stress, caffeine, sleep deprivation, dehydration, thyroid disease.  She had recent  appropriate blood work through your office and I did not suggest any new medication at this time.  Her exam is normal and she is largely reassured.  She is advised to follow-up routinely with your office.  She has no other alarming features to her history, no other neurological complaints or focality on exam.  I do not believe she needs a head imaging test for that reason.  I answered all her questions today and she was in agreement with following up with you as scheduled.  Thank you very much for allowing me to participate in the care of this  nice patient. If I can be of any further assistance to you please do not hesitate to call me at [phone].  Sincerely,   Star Age, MD, PhD

## 2020-04-21 IMAGING — MR MR BRAIN/IAC WITHOUT AND WITH CONTRAST
10 of 14 series · 27 of 48 positions shown · IV contrast (gadavist)
Comparison: None.

CLINICAL DATA: Sudden hearing loss. Anosmia. Right-sided tinnitus
for 2 months.

EXAM:
MRI HEAD WITHOUT AND WITH CONTRAST
TECHNIQUE: Multiplanar, multiecho pulse sequences of the brain and surrounding
structures were obtained without and with intravenous contrast.
CONTRAST:  10 mL Gadavist

[Series 5: T1 · sagittal · 5.0mm · 0.62mm/px · 2 of 25 slices shown (1 of 3)]
[im 1/25]
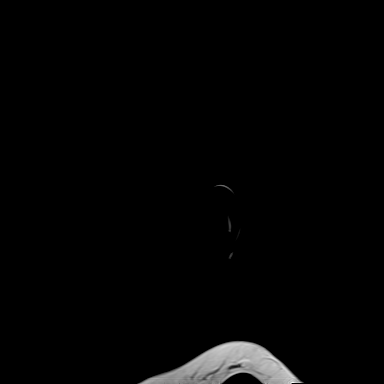
[im 25/25]
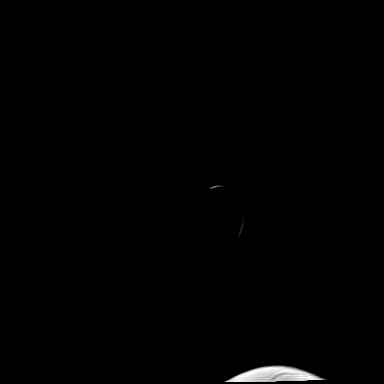

[Series 6: ax dwi_tracew · axial · 3.0mm · 0.73mm/px · z∈[-67,+95]mm · 4 of 55 slices shown]
[im 1/55]
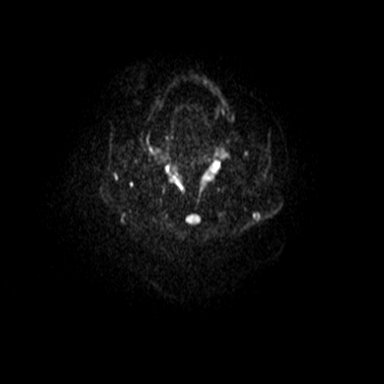
[im 19/55]
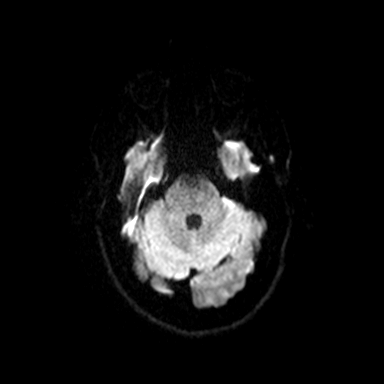
[im 37/55]
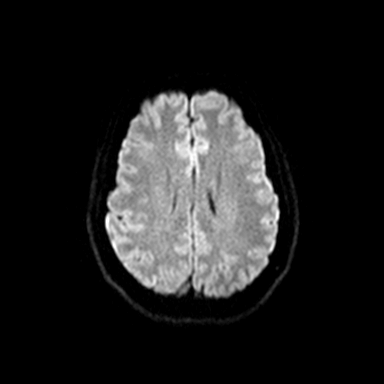
[im 55/55]
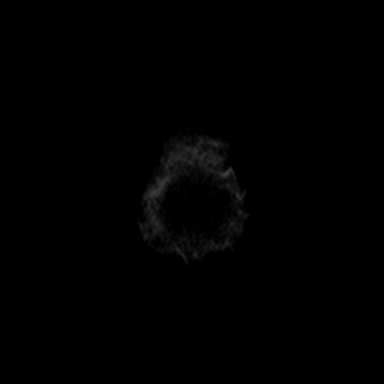

[Series 7: ax dwi_adc · axial · 3.0mm · 0.73mm/px · z∈[-67,-13]mm · 2 of 55 slices shown]
[im 1/55]
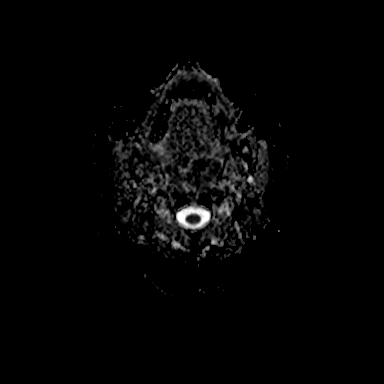
[im 19/55]
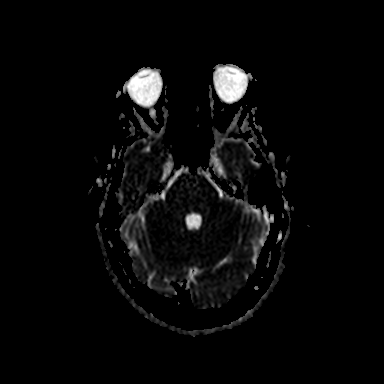

[Series 8: T2 · axial · 5.0mm · 0.53mm/px · z∈[-50,+93]mm · 2 of 25 slices shown]
[im 1/25]
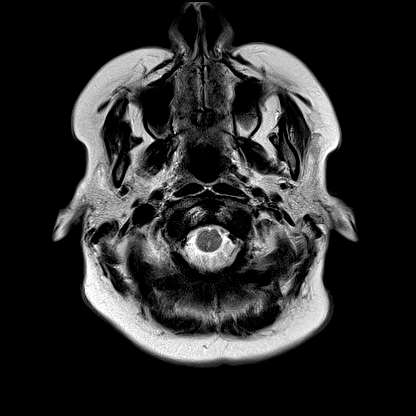
[im 25/25]
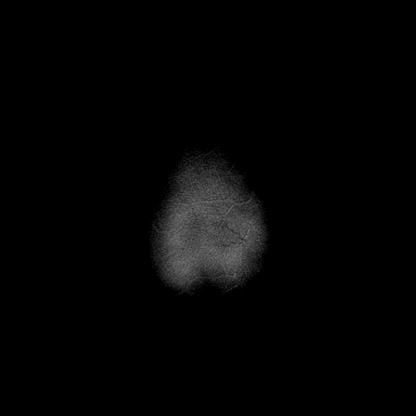

[Series 13: T1 · coronal · non-contrast · 3.0mm · 0.21mm/px · 1 of 13 slices shown (2 of 3)]
[im 1/13]
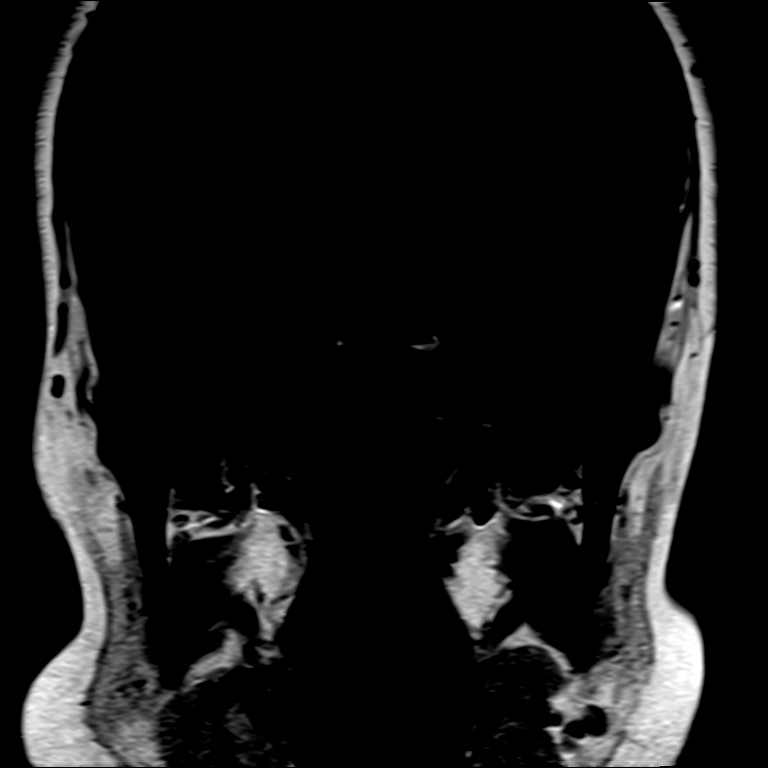

[Series 14: FLAIR · axial · 3.0mm · 0.53mm/px · z∈[-59,+102]mm · 4 of 55 slices shown]
[im 1/55]
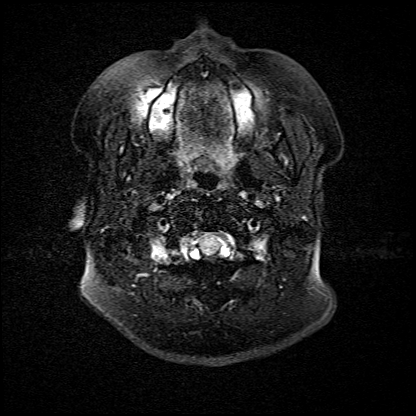
[im 19/55]
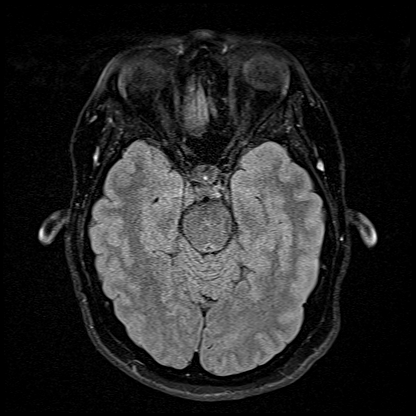
[im 37/55]
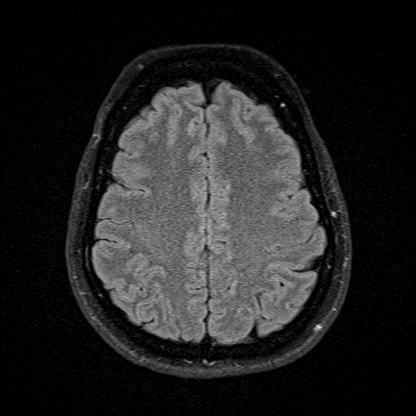
[im 55/55]
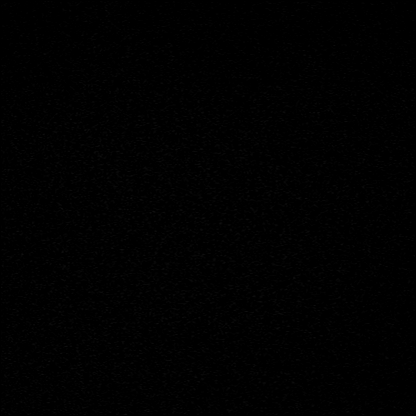

[Series 16: T1 · axial · non-contrast · 3.0mm · 0.21mm/px · 1 of 15 slices shown (3 of 3)]
[im 1/15]
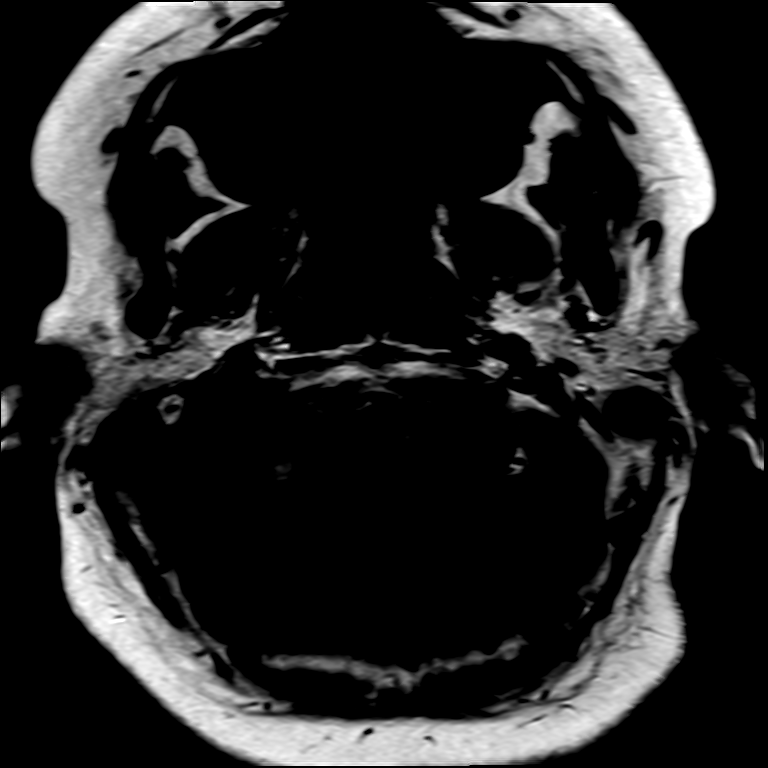

[Series 17: T1 post-contrast · axial · 3.0mm · 0.21mm/px · 1 of 15 slices shown (1 of 3)]
[im 1/15]
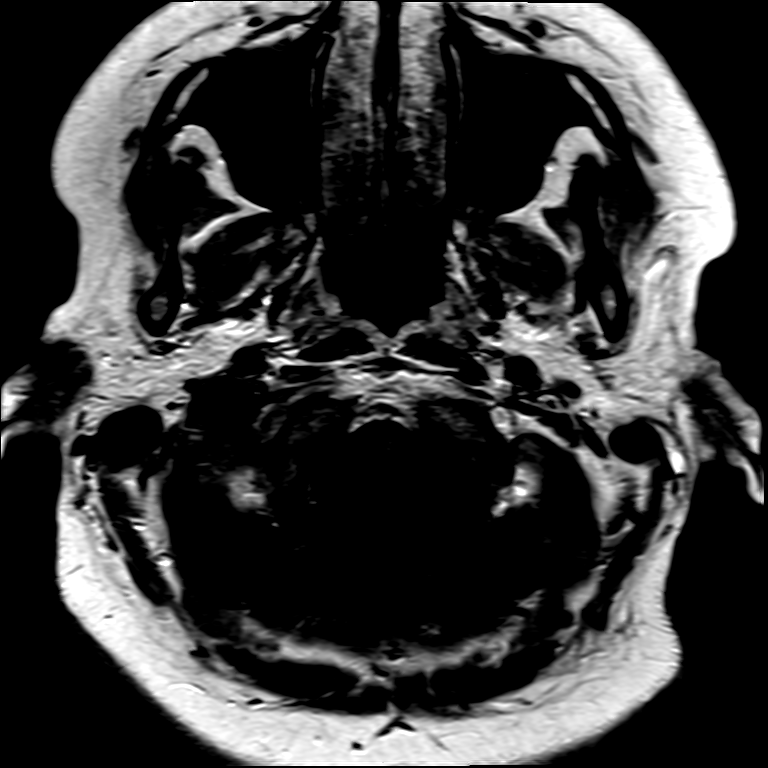

[Series 18: T1 post-contrast · coronal · 3.0mm · 0.21mm/px · 1 of 13 slices shown (2 of 3)]
[im 1/13]
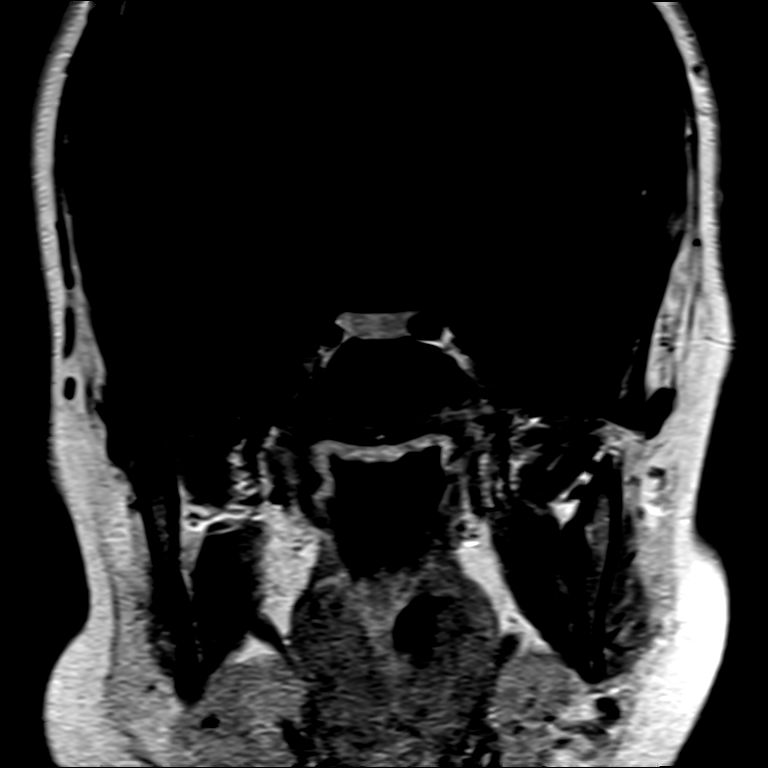

[Series 19: T1 post-contrast · axial · 1.0mm · 0.98mm/px · z∈[-66,+108]mm · 9 of 174 slices shown (3 of 3)]
[im 1/174]
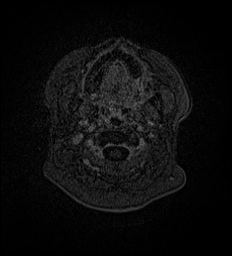
[im 32/174]
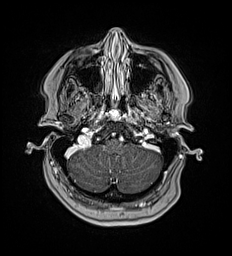
[im 48/174]
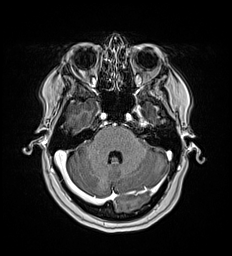
[im 79/174]
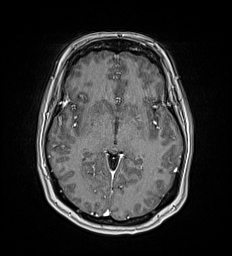
[im 95/174]
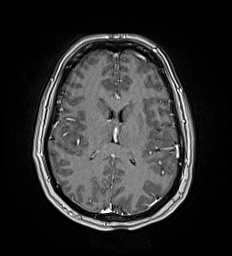
[im 126/174]
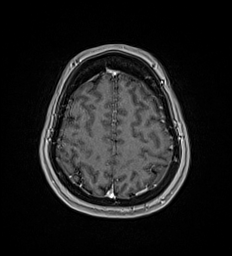
[im 142/174]
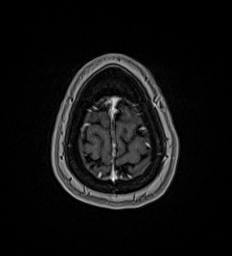
[im 158/174]
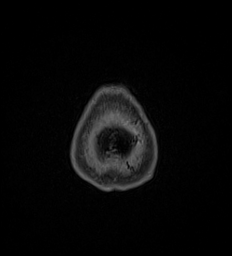
[im 174/174]
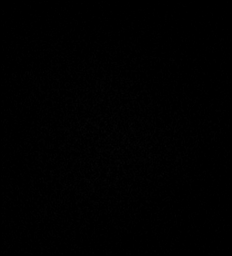

[27 of 48 positions shown; findings below may reference images not displayed]

FINDINGS: The study is mildly motion degraded.

Brain: There is no evidence of acute infarct, intracranial
hemorrhage, mass, midline shift, or extra-axial fluid collection.
The ventricles and sulci are normal. The brain is normal in signal.
No abnormal enhancement is identified.

Dedicated imaging through the internal auditory canals demonstrates
a normal course of cranial nerves VII and VIII without evidence of
mass or abnormal enhancement. Inner ear structures demonstrate
normal signal bilaterally. No mass is seen within the
cerebellopontine angles. The right anterior inferior cerebellar
artery is dominant and courses across the porus acusticus.

Vascular: Major intracranial vascular flow voids are preserved.

Skull and upper cervical spine: Unremarkable bone marrow signal.

Sinuses/Orbits: Unremarkable orbits. Small right maxillary sinus
mucous retention cyst. Clear mastoid air cells.

Other: None.
IMPRESSION: No retrocochlear lesion.  Unremarkable appearance of the brain.

## 2021-01-30 DIAGNOSIS — M899 Disorder of bone, unspecified: Secondary | ICD-10-CM | POA: Insufficient documentation

## 2021-01-30 DIAGNOSIS — J3081 Allergic rhinitis due to animal (cat) (dog) hair and dander: Secondary | ICD-10-CM | POA: Insufficient documentation

## 2021-01-30 DIAGNOSIS — J301 Allergic rhinitis due to pollen: Secondary | ICD-10-CM | POA: Insufficient documentation

## 2021-01-30 DIAGNOSIS — G894 Chronic pain syndrome: Secondary | ICD-10-CM | POA: Insufficient documentation

## 2021-01-30 DIAGNOSIS — Z789 Other specified health status: Secondary | ICD-10-CM | POA: Insufficient documentation

## 2021-01-30 DIAGNOSIS — Z79899 Other long term (current) drug therapy: Secondary | ICD-10-CM | POA: Insufficient documentation

## 2021-01-30 DIAGNOSIS — R21 Rash and other nonspecific skin eruption: Secondary | ICD-10-CM | POA: Insufficient documentation

## 2021-01-30 NOTE — Progress Notes (Deleted)
No-show to initial appointment.

## 2021-01-31 ENCOUNTER — Ambulatory Visit: Payer: Medicaid Other | Admitting: Pain Medicine

## 2021-01-31 DIAGNOSIS — Z789 Other specified health status: Secondary | ICD-10-CM

## 2021-01-31 DIAGNOSIS — Z79899 Other long term (current) drug therapy: Secondary | ICD-10-CM

## 2021-01-31 DIAGNOSIS — G894 Chronic pain syndrome: Secondary | ICD-10-CM

## 2021-01-31 DIAGNOSIS — M899 Disorder of bone, unspecified: Secondary | ICD-10-CM

## 2021-03-08 NOTE — Progress Notes (Signed)
Patient: Cathy Trevino  Service Category: E/M  Provider: Gaspar Cola, MD  DOB: May 02, 1986  DOS: 03/09/2021  Referring Provider: Danelle Berry, NP  MRN: [id number]  Setting: Ambulatory outpatient  PCP: Danelle Berry, NP  Type: New Patient  Specialty: Interventional Pain Management    Location: Office  Delivery: Face-to-face     Primary Reason(s) for Visit: Encounter for initial evaluation of one or more chronic problems (new to examiner) potentially causing chronic pain, and posing a threat to normal musculoskeletal function. (Level of risk: High) CC: Back Pain (Lumbar bilateral )  HPI  Cathy Trevino is a 35 y.o. year old, female patient, who comes for the first time to our practice referred by Danelle Berry, NP for our initial evaluation of her chronic pain. 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); Hyperlipidemia; Obesity; Other B-complex deficiencies; Other headache syndrome; Rash and other nonspecific skin eruption; Vitamin D deficiency; Chronic pain syndrome; Pharmacologic therapy; Disorder of skeletal system; Problems influencing health status; Abnormal MRI, lumbar spine (07/15/2018); Chronic low back pain (1ry area of Pain) (Bilateral) (R>L) w/o sciatica; Lumbosacral facet syndrome (Bilateral); Chronic sacroiliac joint pain (Bilateral) (R>L); DDD (degenerative disc disease), lumbosacral; Morbid obesity with BMI of 40.0-44.9, adult ([place]); Levoscoliosis of lumbar spine; and 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 Today she comes in for evaluation of her Back Pain (Lumbar bilateral )  Pain Assessment: Location: Lower,Left,Right  Back Radiating: into hips and legs to mid thigh Onset: More than a month ago Duration: Chronic pain Quality: Discomfort,Constant,Sharp,Tingling Severity: 9 /10 (subjective, self-reported pain score)  Effect on ADL: sleep disruption, difficulty sitting or standing for long periods of time. Timing: Constant Modifying factors: nothing currently BP: 103/61  HR: 88  Onset and Duration: Date of onset: 2 years again and Present longer than 3 months Cause of pain: Unknown Severity: Getting worse, NAS-11 at its worse: 8/10, NAS-11 at its best: 3/10, NAS-11 now: 7/10 and NAS-11 on the average: 8/10 Timing: Not influenced by the time of the day Aggravating Factors: Bending, Lifiting, Prolonged sitting and Prolonged standing Alleviating Factors: Lying down Associated Problems: Fatigue, Tingling, Pain that wakes patient up and Pain that does not allow patient to sleep Quality of Pain: Pressure-like and Stabbing Previous Examinations or Tests: MRI scan Previous Treatments: Physical Therapy  According to the patient her primary area of pain is that of the lower back (Bilateral) (R>L), which she has been experiencing since February 2021.  The patient denies any prior back surgeries, nerve blocks, or recent x-rays.  She does admit having had physical therapy around October 2021 which initially seemed to have helped but then after a while it stopped helping.  The patient's secondary area of discomfort is that of the lower extremities (Bilateral) (R>L), where she is experiencing a tingling sensation that runs through the posterior aspect of both legs down to mid thigh (hamstrings).  She denies any weakness or numbness of the lower extremities.  She also denies any pain going down into her feet.  According to the patient she has had this treated with physical therapy and Tylenol.  Her understanding is that she has "degenerative disc disease".  The patient also indicates not having had  any recent significant  weight changes.  Physical exam today: The patient was able to toe walk and heel walk without any difficulties.  Strength raise was within normal limits bilaterally.  Hyperextension of the lumbar spine trigger pain across the lower back.  Hyperextension or rotation of the lumbar spine as well as the [facility] maneuver were both positive for bilateral lumbar facet arthralgia.  Figure 4 maneuver/Patrick maneuver was positive bilaterally for sacroiliac joint arthralgia and negative for any type of hip discomfort or decreased range of motion.  Today I took the time to provide the patient with information regarding my pain practice. The patient was informed that my practice is divided into two sections: an interventional pain management section, as well as a completely separate and distinct medication management section. I explained that I have procedure days for my interventional therapies, and evaluation days for follow-ups and medication management. Because of the amount of documentation required during both, they are kept separated. This means that there is the possibility that she may be scheduled for a procedure on one day, and medication management the next. I have also informed her that because of staffing and facility limitations, I no longer take patients for medication management only. To illustrate the reasons for this, I gave the patient the example of surgeons, and how inappropriate it would be to refer a patient to his/her care, just to write for the post-surgical antibiotics on a surgery done by a different surgeon.   Because interventional pain management is my board-certified specialty, the patient was informed that joining my practice means that they are open to any and all interventional therapies. I made it clear that this does not mean that they will be forced to have any procedures done. What this means is that I believe interventional therapies to be essential part of the diagnosis and proper  management of chronic pain conditions. Therefore, patients not interested in these interventional alternatives will be better served under the care of a different practitioner.  The patient was also made aware of my Comprehensive Pain Management Safety Guidelines where by joining my practice, they limit all of their nerve blocks and joint injections to those done by our practice, for as long as we are retained to manage their care.   Historic Controlled Substance Pharmacotherapy Review  PMP and historical list of controlled substances: None Current opioid analgesics:  None MME/day: 0 mg/day  Historical Monitoring: The patient  reports no history of drug use. List of all UDS Test(s): No results found for: MDMA, COCAINSCRNUR, [place], [place], CANNABQUANT, THCU, [place] List of other Serum/Urine Drug Screening Test(s):  No results found for: AMPHSCRSER, BARBSCRSER, BENZOSCRSER, COCAINSCRSER, COCAINSCRNUR, PCPSCRSER, PCPQUANT, THCSCRSER, THCU, CANNABQUANT, OPIATESCRSER, OXYSCRSER, PROPOXSCRSER, ETH Historical Background Evaluation: [place] PMP: PDMP reviewed during this encounter. Online review of the past 8-monthperiod conducted.             PMP NARX Score Report:  Narcotic: 000 Sedative: 000 Stimulant: 000 [place] Department of public safety, offender search: (Editor, commissioningInformation) Non-contributory Risk Assessment Profile: Aberrant behavior: None observed or detected today Risk factors for fatal opioid overdose: None identified today PMP NARX Overdose Risk Score: 000 Fatal overdose hazard ratio (HR): Calculation deferred Non-fatal overdose hazard ratio (HR): Calculation deferred Risk of opioid abuse or dependence: 0.7-3.0% with doses ? 36 MME/day and 6.1-26% with doses ? 120 MME/day. Substance use disorder (SUD) risk level: See below Personal History of Substance Abuse (SUD-Substance use disorder):  Alcohol: Negative  Illegal Drugs: Negative  Rx Drugs: Negative  ORT Risk Level calculation: Low  Risk  Opioid Risk Tool - 03/09/21 0911      Family History of Substance Abuse   Alcohol Positive Female    Illegal Drugs Negative    Rx Drugs Negative      Personal History of Substance Abuse   Alcohol Negative    Illegal Drugs Negative    Rx Drugs Negative      Psychological Disease   Psychological Disease Negative    Depression Negative      Total Score   Opioid Risk Tool Scoring 1    Opioid Risk Interpretation Low Risk          ORT Scoring interpretation table:  Score <3 = Low Risk for SUD  Score between 4-7 = Moderate Risk for SUD  Score >8 = High Risk for Opioid Abuse   PHQ-2 Depression Scale:  Total score:    PHQ-2 Scoring interpretation table: (Score and probability of major depressive disorder)  Score 0 = No depression  Score 1 = 15.4% Probability  Score 2 = 21.1% Probability  Score 3 = 38.4% Probability  Score 4 = 45.5% Probability  Score 5 = 56.4% Probability  Score 6 = 78.6% Probability   PHQ-9 Depression Scale:  Total score:    PHQ-9 Scoring interpretation table:  Score 0-4 = No depression  Score 5-9 = Mild depression  Score 10-14 = Moderate depression  Score 15-19 = Moderately severe depression  Score 20-27 = Severe depression (2.4 times higher risk of SUD and 2.89 times higher risk of overuse)   Pharmacologic Plan: As per protocol, I have not taken over any controlled substance management, pending the results of ordered tests and/or consults.            Initial impression: Pending review of available data and ordered tests.  Meds   Current Outpatient Medications:  .  acetaminophen (TYLENOL) 500 MG tablet, Take 500 mg by mouth every 6 (six) hours as needed., Disp: , Rfl:  .  celecoxib (CELEBREX) 100 MG capsule, Celecoxib 100 MG Oral Capsule QTY: 30 capsule Days: 30 Refills: 2  Written: 02/10/20 Patient Instructions: Take 1 capsule by mouth daily for arthritis, large glass of water and small snack, Disp: , Rfl:  .  cetirizine (ZYRTEC) 10 MG tablet,  Cetirizine HCl 10 MG Oral Tablet QTY: 30 tablet Days: 30 Refills: 3  Written: 02/10/20 Patient Instructions: Take 1 tablet by mouth nightly at bedtime as needed for seasonal allergies, Disp: , Rfl:  .  diclofenac Sodium (VOLTAREN) 1 % GEL, Apply topically 4 (four) times daily., Disp: , Rfl:  .  EPINEPHrine 0.3 mg/0.3 mL IJ SOAJ injection, Inject 0.3 mLs into the muscle See admin instructions., Disp: , Rfl:  .  ergocalciferol (VITAMIN D2) 1.25 MG (50000 UT) capsule, Take 50,000 Units by mouth once a week., Disp: , Rfl:  .  fluticasone (FLONASE) 50 MCG/ACT nasal spray, Place into both nostrils daily., Disp: , Rfl:  .  levonorgestrel (MIRENA) 20 MCG/24HR IUD, 1 each by Intrauterine route once., Disp: , Rfl:  .  Multiple Vitamin (MULTIVITAMIN ADULT PO), Take 1 tablet by mouth daily at 8 pm., Disp: , Rfl:  .  NON FORMULARY, Allergy shots weekly, Disp: , Rfl:  .  tiZANidine (ZANAFLEX) 2 MG tablet, Take by mouth daily as needed for muscle spasms (at bedtime)., Disp: , Rfl:  .  VITAMIN B1-B12 IM, Inject into the muscle every 30 (thirty) days., Disp: , Rfl:  Imaging Review  Lumbosacral Imaging: Lumbar MR w/wo contrast: Results for orders placed during the hospital encounter of 07/15/18 MR Lumbar Spine W Wo Contrast  Narrative CLINICAL DATA:  Initial evaluation for central low back pain for 2 months, worsening.  EXAM: MRI LUMBAR SPINE WITHOUT AND WITH CONTRAST  TECHNIQUE: Multiplanar and multiecho pulse sequences of the lumbar spine were obtained without and with intravenous contrast.  CONTRAST:  98m MULTIHANCE GADOBENATE DIMEGLUMINE 529 MG/ML IV SOLN  COMPARISON:  None available.  FINDINGS: Segmentation: Normal segmentation. Lowest well-formed disc labeled the L5-S1 level.  Alignment: Mild levoscoliosis. Vertebral bodies otherwise normally aligned with preservation of the normal lumbar lordosis.  Vertebrae: Vertebral body heights maintained without evidence for acute or chronic  fracture. Bone marrow signal intensity diffusely decreased on T1 weighted imaging, most commonly related to anemia, smoking, or obesity. No discrete or worrisome osseous lesions. No abnormal marrow edema or enhancement.  Conus medullaris and cauda equina: Conus extends to the L1 level. Conus and cauda equina appear normal.  Paraspinal and other soft tissues: Paraspinous soft tissues within normal limits. Visualized visceral structures unremarkable.  Disc levels:  No significant findings are seen through the L4-5 level.  L5-S1: Disc desiccation. Broad central/left subarticular disc protrusion extends into the left lateral recess, impinging upon the descending left S1 nerve root which is slightly displaced posteriorly (series 5, image 37). Moderate left lateral recess stenosis. Central canal remains widely patent. No significant foraminal encroachment.  IMPRESSION: 1. 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 2. Diffusely decreased T1 signal throughout the visualized bone marrow, nonspecific, but suspected to be related to patient's history of anemia. 3. Otherwise normal MRI of the lumbar spine.   Electronically Signed By: BJeannine BogaM.D. On: 07/15/2018 22:58  Ankle Imaging: Ankle-L DG Complete: Results for orders placed during the hospital encounter of 08/06/15 DG Ankle Complete Left  Narrative CLINICAL DATA:  FGolden Circlewalking up steps. Complains of medial ankle pain.  EXAM: LEFT ANKLE COMPLETE - 3+ VIEW  COMPARISON:  None.  FINDINGS: Negative for a fracture or dislocation. No significant soft tissue swelling. Tiny spur along the plantar aspect of the calcaneus. Normal alignment of the left ankle.  IMPRESSION: No acute abnormality.   Electronically Signed By: AMarkus DaftM.D. On: 08/06/2015 13:50  Wrist Imaging: Wrist-L DG Complete: Results for  orders placed during the hospital encounter of 07/20/15 DG Wrist Complete Left  Narrative CLINICAL DATA:  Twisting injury to left wrist while picking up child. Left wrist pain and swelling. Initial encounter.  EXAM: LEFT WRIST - COMPLETE 3+ VIEW  COMPARISON:  None.  FINDINGS: There is no evidence of fracture or dislocation. The carpal rows are intact, and demonstrate normal alignment. The joint spaces are preserved.  No significant soft tissue abnormalities are seen.  IMPRESSION: No evidence of fracture or dislocation.   Electronically Signed By: JGarald BaldingM.D. On: 07/20/2015 19:20  Complexity Note: Imaging results reviewed. Results shared with Cathy Trevino, using Layman's terms.                        ROS  Cardiovascular: Chest pain Pulmonary or Respiratory: Temporary stoppage of breathing during sleep Neurological: No reported neurological signs or symptoms such as seizures, abnormal skin sensations, urinary and/or fecal incontinence, being born with an abnormal open spine and/or a tethered spinal cord Psychological-Psychiatric: Anxiousness and Depressed Gastrointestinal: No reported gastrointestinal signs  or symptoms such as vomiting or evacuating blood, reflux, heartburn, alternating episodes of diarrhea and constipation, inflamed or scarred liver, or pancreas or irrregular and/or infrequent bowel movements Genitourinary: No reported renal or genitourinary signs or symptoms such as difficulty voiding or producing urine, peeing blood, non-functioning kidney, kidney stones, difficulty emptying the bladder, difficulty controlling the flow of urine, or chronic kidney disease Hematological: Weakness due to low blood hemoglobin or red blood cell count (Anemia) Endocrine: No reported endocrine signs or symptoms such as high or low blood sugar, rapid heart rate due to high thyroid levels, obesity or weight gain due to slow thyroid or thyroid disease Rheumatologic: No reported  rheumatological signs and symptoms such as fatigue, joint pain, tenderness, swelling, redness, heat, stiffness, decreased range of motion, with or without associated rash Musculoskeletal: Negative for myasthenia gravis, muscular dystrophy, multiple sclerosis or malignant hyperthermia Work History: Out of work due to pain  Allergies  Cathy Trevino is allergic to penicillins.  Laboratory Chemistry Profile   Renal Lab Results  Component Value Date   BUN 8 11/27/2017   CREATININE 0.62 11/27/2017   GFRAA >60 11/27/2017   GFRNONAA >60 11/27/2017   PROTEINUR 100 (A) 11/27/2017     Electrolytes Lab Results  Component Value Date   NA 138 11/27/2017   K 3.6 11/27/2017   CL 103 11/27/2017   CALCIUM 9.1 11/27/2017     Hepatic Lab Results  Component Value Date   AST 17 11/27/2017   ALT 12 (L) 11/27/2017   ALBUMIN 3.8 11/27/2017   ALKPHOS 61 11/27/2017   LIPASE 23 11/27/2017     ID Lab Results  Component Value Date   PREGTESTUR NEGATIVE 11/27/2017     Bone No results found for: [place], VD125OH2TOT, [id number], QZ0092ZR0, 25OHVITD1, 25OHVITD2, 25OHVITD3, TESTOFREE, TESTOSTERONE   Endocrine Lab Results  Component Value Date   GLUCOSE 106 (H) 11/27/2017   GLUCOSEU NEGATIVE 11/27/2017     Neuropathy No results found for: VITAMINB12, FOLATE, HGBA1C, HIV   CNS No results found for: COLORCSF, APPEARCSF, RBCCOUNTCSF, WBCCSF, POLYSCSF, LYMPHSCSF, EOSCSF, PROTEINCSF, GLUCCSF, JCVIRUS, CSFOLI, IGGCSF, LABACHR, ACETBL, LABACHR, ACETBL   Inflammation (CRP: Acute  ESR: Chronic) No results found for: CRP, ESRSEDRATE, LATICACIDVEN   Rheumatology No results found for: RF, ANA, LABURIC, URICUR, LYMEIGGIGMAB, LYMEABIGMQN, HLAB27   Coagulation Lab Results  Component Value Date   PLT 217 06/10/2019     Cardiovascular Lab Results  Component Value Date   TROPONINI < 0.02 10/28/2014   HGB 14.1 06/10/2019   HCT 42.3 06/10/2019     Screening Lab Results  Component Value Date    PREGTESTUR NEGATIVE 11/27/2017     Cancer No results found for: CEA, CA125, LABCA2   Allergens No results found for: ALMOND, APPLE, ASPARAGUS, AVOCADO, BANANA, BARLEY, BASIL, B[place]LE[place], GREENBEAN, LIMABEAN, WHITEBEAN, BEEFIGE, REDBEET, BLUEBERRY, BROCCOLI, CABBAGE, MELON, CARROT, CASEIN, CASHEWNUT, CAULIFLOWER, CELERY     Note: Lab results reviewed.  [place]  Drug: Cathy Trevino  reports no history of drug use. Alcohol:  reports no history of alcohol use. Tobacco:  reports that she quit smoking about 5 years ago. Her smoking use included cigarettes. She has a 1.25 pack-year smoking history. She has never used smokeless tobacco. Medical:  has a past medical history of Anemia, Anxiety, Asthma, Depression, and Menorrhagia. Family: family history includes Heart failure in her mother; Hypertension in her mother.  Past Surgical History:  Procedure Laterality Date  . CESAREAN SECTION  2012  . TUBAL LIGATION     Active Ambulatory  Problems    Diagnosis Date Noted  . Iron deficiency anemia due to chronic blood loss 02/18/2018  . Microcytic anemia 02/25/2018  . Menorrhagia with regular cycle 03/11/2018  . Varicose veins of leg with pain, bilateral 08/05/2019  . Allergic rhinitis 05/21/2014  . Allergic rhinitis due to animal (cat) (dog) hair and dander 01/30/2021  . Allergic rhinitis due to pollen 01/30/2021  . Chest pain 12/21/2015  . Dermatitis 10/23/2013  . 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 Hyperlipidemia 10/17/2018  . Obesity 11/25/2014  . Other B-complex deficiencies 05/23/2018  . Other headache syndrome 06/11/2013  . Rash and other nonspecific skin eruption 01/30/2021  . Vitamin D deficiency 05/23/2018  . Chronic pain syndrome 01/30/2021  . Pharmacologic therapy 01/30/2021  . Disorder of skeletal system 01/30/2021  . Problems influencing health status 01/30/2021  . Abnormal MRI, lumbar spine (07/15/2018) 03/09/2021  . Chronic low back  pain (1ry area of Pain) (Bilateral) (R>L) w/o sciatica 03/09/2021  . Lumbosacral facet syndrome (Bilateral) 03/09/2021  . Chronic sacroiliac joint pain (Bilateral) (R>L) 03/09/2021  . DDD (degenerative disc disease), lumbosacral 03/09/2021  . Morbid obesity with BMI of 40.0-44.9, adult ([place]) 03/09/2021  . Levoscoliosis of lumbar spine 03/09/2021  . 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 No Resolved Ambulatory Problems   Past Medical History:  Diagnosis Date  . Anemia   . Anxiety   . Asthma   . Depression   . Menorrhagia    Constitutional Exam  General appearance: Well nourished, well developed, and well hydrated. In no apparent acute distress Vitals:   03/09/21 0905  BP: 103/61  Pulse: 88  Resp: 16  Temp: (!) 97.2 F (36.2 C)  TempSrc: Temporal  SpO2: 99%  Weight: 252 lb (114.3 kg)  Height: 5' 4"  (1.626 m)   BMI Assessment: Estimated body mass index is 43.26 kg/m as calculated from the following:   Height as of this encounter: 5' 4"  (1.626 m).   Weight as of this encounter: 252 lb (114.3 kg).  BMI interpretation table: BMI level Category Range association with higher incidence of chronic pain  <18 kg/m2 Underweight   18.5-24.9 kg/m2 Ideal body weight   25-29.9 kg/m2 Overweight Increased incidence by 20%  30-34.9 kg/m2 Obese (Class I) Increased incidence by 68%  35-39.9 kg/m2 Severe obesity (Class II) Increased incidence by 136%  >40 kg/m2 Extreme obesity (Class III) Increased incidence by 254%   Patient's current BMI Ideal Body weight  Body mass index is 43.26 kg/m. Ideal body weight: 54.7 kg (120 lb 9.5 oz) Adjusted ideal body weight: 78.5 kg (173 lb 2.5 oz)   BMI Readings from Last 4 Encounters:  03/09/21 43.26 kg/m  04/07/20 43.26 kg/m  11/06/19 42.74 kg/m  08/05/19 42.40 kg/m   Wt Readings from Last 4 Encounters:  03/09/21 252 lb (114.3 kg)  04/07/20 252 lb  (114.3 kg)  11/06/19 249 lb (112.9 kg)  08/05/19 247 lb (112 kg)    Psych/Mental status: Alert, oriented x 3 (person, place, & time)       Eyes: PERLA Respiratory: No evidence of acute respiratory distress  Assessment  Primary Diagnosis & Pertinent Problem List: The primary encounter diagnosis was Chronic pain syndrome. 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  Visit Diagnosis (New problems to examiner): 1. Chronic pain syndrome   2. Chronic low back pain (1ry area of Pain) (Bilateral) (R>L) w/o sciatica   3. Lumbosacral facet syndrome (Bilateral)   4. Chronic sacroiliac joint pain (Bilateral) (R>L)   5. Abnormal MRI, lumbar spine (07/15/2018)   6. DDD (degenerative disc disease), lumbosacral   7. Displacement of lumbar intervertebral disc without myelopathy   8. 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 Levoscoliosis of lumbar spine   10. Pharmacologic therapy   11. Disorder of skeletal system   12. Problems influencing health status   13. Morbid obesity with BMI of 40.0-44.9, adult [facility])    Plan of Care (Initial workup plan)  Note: Cathy Trevino was reminded that as per protocol, today's visit has been an evaluation only. We have not taken over the patient's controlled substance management.  Problem-specific plan: No problem-specific Assessment & Plan notes found for this encounter.   Lab Orders     Compliance  Drug Analysis, Ur     Comp. Metabolic Panel (12)     Magnesium     Vitamin B12     Sedimentation rate     25-Hydroxy vitamin D Lcms D2+D3     C-reactive protein  Imaging Orders     DG Lumbar Spine Complete W/Bend     DG Si Joints Referral Orders  No referral(s) requested today   Procedure Orders    No procedure(s) ordered today   Pharmacotherapy (current): Medications ordered:  No orders of the defined types were placed in this encounter.  Medications administered during this visit: Stacy Sailer. Ashkar had no medications administered during this visit.   Pharmacological management options:  Opioid Analgesics: The patient was informed that there is no guarantee that she would be a candidate for opioid analgesics. The decision will be made following CDC guidelines. This decision will be based on the results of diagnostic studies, as well as Cathy Trevino's risk profile.   Membrane stabilizer: To be determined at a later time  Muscle relaxant: To be determined at a later time  NSAID: To be determined at a later time  Other analgesic(s): To be determined at a later time   Interventional management options: Cathy Trevino was informed that there is no guarantee that she would be a candidate for interventional therapies. The decision will be based on the results of diagnostic studies, as well as Cathy Trevino's risk profile.  Procedure(s) under consideration:  Diagnostic bilateral lumbar facet block  Diagnostic bilateral SI joint block  Diagnostic left L5 transforaminal ESI    Provider-requested follow-up: Return for (40 min)(2V), (F2F), (s/p Tests).  Future Appointments  Date Time Provider [place]  09/14/2021  9:45 AM Ralene Bathe, MD [facility] None    Note by: Gaspar Cola, MD Date: 03/09/2021; Time: 10:04 AM

## 2021-03-09 ENCOUNTER — Ambulatory Visit
Admission: RE | Admit: 2021-03-09 | Discharge: 2021-03-09 | Disposition: A | Discharge status: Home / Self Care | Payer: Medicaid Other | Source: Ambulatory Visit | Attending: Pain Medicine | Admitting: Pain Medicine

## 2021-03-09 ENCOUNTER — Ambulatory Visit (HOSPITAL_BASED_OUTPATIENT_CLINIC_OR_DEPARTMENT_OTHER): Payer: Medicaid Other | Admitting: Pain Medicine

## 2021-03-09 ENCOUNTER — Other Ambulatory Visit: Payer: Self-pay

## 2021-03-09 ENCOUNTER — Encounter: Payer: Self-pay | Admitting: Pain Medicine

## 2021-03-09 VITALS — BP 103/61 | HR 88 | Temp 97.2°F | Resp 16 | Ht 64.0 in | Wt 252.0 lb

## 2021-03-09 DIAGNOSIS — M5126 Other intervertebral disc displacement, lumbar region: Secondary | ICD-10-CM

## 2021-03-09 DIAGNOSIS — M47817 Spondylosis without myelopathy or radiculopathy, lumbosacral region: Secondary | ICD-10-CM | POA: Insufficient documentation

## 2021-03-09 DIAGNOSIS — M533 Sacrococcygeal disorders, not elsewhere classified: Secondary | ICD-10-CM | POA: Insufficient documentation

## 2021-03-09 DIAGNOSIS — G8929 Other chronic pain: Secondary | ICD-10-CM | POA: Insufficient documentation

## 2021-03-09 DIAGNOSIS — R937 Abnormal findings on diagnostic imaging of other parts of musculoskeletal system: Secondary | ICD-10-CM | POA: Insufficient documentation

## 2021-03-09 DIAGNOSIS — M545 Low back pain, unspecified: Secondary | ICD-10-CM

## 2021-03-09 DIAGNOSIS — M899 Disorder of bone, unspecified: Secondary | ICD-10-CM | POA: Insufficient documentation

## 2021-03-09 DIAGNOSIS — M5137 Other intervertebral disc degeneration, lumbosacral region: Secondary | ICD-10-CM | POA: Insufficient documentation

## 2021-03-09 DIAGNOSIS — M4186 Other forms of scoliosis, lumbar region: Secondary | ICD-10-CM

## 2021-03-09 DIAGNOSIS — G894 Chronic pain syndrome: Secondary | ICD-10-CM | POA: Insufficient documentation

## 2021-03-09 DIAGNOSIS — Z79899 Other long term (current) drug therapy: Secondary | ICD-10-CM | POA: Insufficient documentation

## 2021-03-09 DIAGNOSIS — M4807 Spinal stenosis, lumbosacral region: Secondary | ICD-10-CM

## 2021-03-09 DIAGNOSIS — Z789 Other specified health status: Secondary | ICD-10-CM | POA: Insufficient documentation

## 2021-03-09 DIAGNOSIS — Z6841 Body Mass Index (BMI) 40.0 and over, adult: Secondary | ICD-10-CM

## 2021-03-09 NOTE — Patient Instructions (Signed)
____________________________________________________________________________________________  General Risks and Possible Complications  Patient Responsibilities: It is important that you read this as it is part of your informed consent. It is our duty to inform you of the risks and possible complications associated with treatments offered to you. It is your responsibility as a patient to read this and to ask questions about anything that is not clear or that you believe was not covered in this document.  Patient's Rights: You have the right to refuse treatment. You also have the right to change your mind, even after initially having agreed to have the treatment done. However, under this last option, if you wait until the last second to change your mind, you may be charged for the materials used up to that point.  Introduction: Medicine is not an Chief Strategy Officer. Everything in Medicine, including the lack of treatment(s), carries the potential for danger, harm, or loss (which is by definition: Risk). In Medicine, a complication is a secondary problem, condition, or disease that can aggravate an already existing one. All treatments carry the risk of possible complications. The fact that a side effects or complications occurs, does not imply that the treatment was conducted incorrectly. It must be clearly understood that these can happen even when everything is done following the highest safety standards.  No treatment: You can choose not to proceed with the proposed treatment alternative. The "PRO(s)" would include: avoiding the risk of complications associated with the therapy. The "CON(s)" would include: not getting any of the treatment benefits. These benefits fall under one of three categories: diagnostic; therapeutic; and/or palliative. Diagnostic benefits include: getting information which can ultimately lead to improvement of the disease or symptom(s). Therapeutic benefits are those associated with the  successful treatment of the disease. Finally, palliative benefits are those related to the decrease of the primary symptoms, without necessarily curing the condition (example: decreasing the pain from a flare-up of a chronic condition, such as incurable terminal cancer).  General Risks and Complications: These are associated to most interventional treatments. They can occur alone, or in combination. They fall under one of the following six (6) categories: no benefit or worsening of symptoms; bleeding; infection; nerve damage; allergic reactions; and/or death. 1. No benefits or worsening of symptoms: In Medicine there are no guarantees, only probabilities. No healthcare provider can ever guarantee that a medical treatment will work, they can only state the probability that it may. Furthermore, there is always the possibility that the condition may worsen, either directly, or indirectly, as a consequence of the treatment. 2. Bleeding: This is more common if the patient is taking a blood thinner, either prescription or over the counter (example: Goody Powders, Fish oil, Aspirin, Garlic, etc.), or if suffering a condition associated with impaired coagulation (example: Hemophilia, cirrhosis of the liver, low platelet counts, etc.). However, even if you do not have one on these, it can still happen. If you have any of these conditions, or take one of these drugs, make sure to notify your treating physician. 3. Infection: This is more common in patients with a compromised immune system, either due to disease (example: diabetes, cancer, human immunodeficiency virus [HIV], etc.), or due to medications or treatments (example: therapies used to treat cancer and rheumatological diseases). However, even if you do not have one on these, it can still happen. If you have any of these conditions, or take one of these drugs, make sure to notify your treating physician. 4. Nerve Damage: This is more common when the  treatment is  an invasive one, but it can also happen with the use of medications, such as those used in the treatment of cancer. The damage can occur to small secondary nerves, or to large primary ones, such as those in the spinal cord and brain. This damage may be temporary or permanent and it may lead to impairments that can range from temporary numbness to permanent paralysis and/or brain death. 5. Allergic Reactions: Any time a substance or material comes in contact with our body, there is the possibility of an allergic reaction. These can range from a mild skin rash (contact dermatitis) to a severe systemic reaction (anaphylactic reaction), which can result in death. 6. Death: In general, any medical intervention can result in death, most of the time due to an unforeseen complication. ____________________________________________________________________________________________   ______________________________________________________________________________________________  Specialty Pain Scale  Introduction:  There are significant differences in how pain is reported. The word pain usually refers to physical pain, but it is also a common synonym of suffering. The medical community uses a scale from 0 (zero) to 10 (ten) to report pain level. Zero (0) is described as "no pain", while ten (10) is described as "the worse pain you can imagine". The problem with this scale is that physical pain is reported along with suffering. Suffering refers to mental pain, or more often yet it refers to any unpleasant feeling, emotion or aversion associated with the perception of harm or threat of harm. It is the psychological component of pain.  Pain Specialists prefer to separate the two components. The pain scale used by this practice is the Verbal Numerical Rating Scale (VNRS-11). This scale is for the physical pain only. DO NOT INCLUDE how your pain psychologically affects you. This scale is for adults 69 years of age and  older. It has 11 (eleven) levels. The 1st level is 0/10. This means: "right now, I have no pain". In the context of pain management, it also means: "right now, my physical pain is under control with the current therapy".  General Information:  The scale should reflect your current level of pain. Unless you are specifically asked for the level of your worst pain, or your average pain. If you are asked for one of these two, then it should be understood that it is over the past 24 hours.  Levels 1 (one) through 5 (five) are described below, and can be treated as an outpatient. Ambulatory pain management facilities such as ours are more than adequate to treat these levels. Levels 6 (six) through 10 (ten) are also described below, however, these must be treated as a hospitalized patient. While levels 6 (six) and 7 (seven) may be evaluated at an urgent care facility, levels 8 (eight) through 10 (ten) constitute medical emergencies and as such, they belong in a hospital's emergency department. When having these levels (as described below), do not come to our office. Our facility is not equipped to manage these levels. Go directly to an urgent care facility or an emergency department to be evaluated.  Definitions:  Activities of Daily Living (ADL): Activities of daily living (ADL or ADLs) is a term used in healthcare to refer to people's daily self-care activities. Health professionals often use a person's ability or inability to perform ADLs as a measurement of their functional status, particularly in regard to people post injury, with disabilities and the elderly. There are two ADL levels: Basic and Instrumental. Basic Activities of Daily Living (BADL  or BADLs) consist of self-care  tasks that include: Bathing and showering; personal hygiene and grooming (including brushing/combing/styling hair); dressing; Toilet hygiene (getting to the toilet, cleaning oneself, and getting back up); eating and self-feeding (not  including cooking or chewing and swallowing); functional mobility, often referred to as "transferring", as measured by the ability to walk, get in and out of bed, and get into and out of a chair; the broader definition (moving from one place to another while performing activities) is useful for people with different physical abilities who are still able to get around independently. Basic ADLs include the things many people do when they get up in the morning and get ready to go out of the house: get out of bed, go to the toilet, bathe, dress, groom, and eat. On the average, loss of function typically follows a particular order. Hygiene is the first to go, followed by loss of toilet use and locomotion. The last to go is the ability to eat. When there is only one remaining area in which the person is independent, there is a 62.9% chance that it is eating and only a 3.5% chance that it is hygiene. Instrumental Activities of Daily Living (IADL or IADLs) are not necessary for fundamental functioning, but they let an individual live independently in a community. IADL consist of tasks that include: cleaning and maintaining the house; home establishment and maintenance; care of others (including selecting and supervising caregivers); care of pets; child rearing; managing money; managing financials (investments, etc.); meal preparation and cleanup; shopping for groceries and necessities; moving within the community; safety procedures and emergency responses; health management and maintenance (taking prescribed medications); and using the telephone or other form of communication.  Instructions:  Most patients tend to report their pain as a combination of two factors, their physical pain and their psychosocial pain. This last one is also known as "suffering" and it is reflection of how physical pain affects you socially and psychologically. From now on, report them separately.  From this point on, when asked to report  your pain level, report only your physical pain. Use the following table for reference.  Pain Clinic Pain Levels (0-5/10)  Pain Level Score  Description  No Pain 0   Mild pain 1 Nagging, annoying, but does not interfere with basic activities of daily living (ADL). Patients are able to eat, bathe, get dressed, toileting (being able to get on and off the toilet and perform personal hygiene functions), transfer (move in and out of bed or a chair without assistance), and maintain continence (able to control bladder and bowel functions). Blood pressure and heart rate are unaffected. A normal heart rate for a healthy adult ranges from 60 to 100 bpm (beats per minute).   Mild to moderate pain 2 Noticeable and distracting. Impossible to hide from other people. More frequent flare-ups. Still possible to adapt and function close to normal. It can be very annoying and may have occasional stronger flare-ups. With discipline, patients may get used to it and adapt.   Moderate pain 3 Interferes significantly with activities of daily living (ADL). It becomes difficult to feed, bathe, get dressed, get on and off the toilet or to perform personal hygiene functions. Difficult to get in and out of bed or a chair without assistance. Very distracting. With effort, it can be ignored when deeply involved in activities.   Moderately severe pain 4 Impossible to ignore for more than a few minutes. With effort, patients may still be able to manage work or participate in  some social activities. Very difficult to concentrate. Signs of autonomic nervous system discharge are evident: dilated pupils (mydriasis); mild sweating (diaphoresis); sleep interference. Heart rate becomes elevated (>115 bpm). Diastolic blood pressure (lower number) rises above 100 mmHg. Patients find relief in laying down and not moving.   Severe pain 5 Intense and extremely unpleasant. Associated with frowning face and frequent crying. Pain overwhelms the  senses.  Ability to do any activity or maintain social relationships becomes significantly limited. Conversation becomes difficult. Pacing back and forth is common, as getting into a comfortable position is nearly impossible. Pain wakes you up from deep sleep. Physical signs will be obvious: pupillary dilation; increased sweating; goosebumps; brisk reflexes; cold, clammy hands and feet; nausea, vomiting or dry heaves; loss of appetite; significant sleep disturbance with inability to fall asleep or to remain asleep. When persistent, significant weight loss is observed due to the complete loss of appetite and sleep deprivation.  Blood pressure and heart rate becomes significantly elevated. Caution: If elevated blood pressure triggers a pounding headache associated with blurred vision, then the patient should immediately seek attention at an urgent or emergency care unit, as these may be signs of an impending stroke.    Emergency Department Pain Levels (6-10/10)  Emergency Room Pain 6 Severely limiting. Requires emergency care and should not be seen or managed at an outpatient pain management facility. Communication becomes difficult and requires great effort. Assistance to reach the emergency department may be required. Facial flushing and profuse sweating along with potentially dangerous increases in heart rate and blood pressure will be evident.   Distressing pain 7 Self-care is very difficult. Assistance is required to transport, or use restroom. Assistance to reach the emergency department will be required. Tasks requiring coordination, such as bathing and getting dressed become very difficult.   Disabling pain 8 Self-care is no longer possible. At this level, pain is disabling. The individual is unable to do even the most "basic" activities such as walking, eating, bathing, dressing, transferring to a bed, or toileting. Fine motor skills are lost. It is difficult to think clearly.   Incapacitating pain  9 Pain becomes incapacitating. Thought processing is no longer possible. Difficult to remember your own name. Control of movement and coordination are lost.   The worst pain imaginable 10 At this level, most patients pass out from pain. When this level is reached, collapse of the autonomic nervous system occurs, leading to a sudden drop in blood pressure and heart rate. This in turn results in a temporary and dramatic drop in blood flow to the brain, leading to a loss of consciousness. Fainting is one of the body's self defense mechanisms. Passing out puts the brain in a calmed state and causes it to shut down for a while, in order to begin the healing process.    Summary: 1.   Refer to this scale when providing [place] with your pain level. 2.   Be accurate and careful when reporting your pain level. This will help with your care. 3.   Over-reporting your pain level will lead to loss of credibility. 4.   Even a level of 1/10 means that there is pain and will be treated at our facility. 5.   High, inaccurate reporting will be documented as "Symptom Exaggeration", leading to loss of credibility and suspicions of possible secondary gains such as obtaining more narcotics, or wanting to appear disabled, for fraudulent reasons. 6.   Only pain levels of 5 or below will be seen  at our facility. 7.   Pain levels of 6 and above will be sent to the Emergency Department and the appointment cancelled.  ______________________________________________________________________________________________     ______________________________________________________________________________________________  Body mass index (BMI)  Body mass index (BMI) is a common tool for deciding whether a person has an appropriate body weight.  It measures a persons weight in relation to their height.   According to the [employer] of health (NIH): Marland Kitchen A BMI of less than 18.5 means that a person is underweight. . A BMI of between  18.5 and 24.9 is ideal. . A BMI of between 25 and 29.9 is overweight. . A BMI over 30 indicates obesity.  Weight Management Required  URGENT: Your weight has been found to be adversely affecting your health.  Dear Ms. Savas:  Your current Estimated body mass index is 43.26 kg/m as calculated from the following:   Height as of this encounter: _0  (1.626 m).   Weight as of this encounter: 252 lb (114.3 kg).  Please use the table below to identify your weight category and associated incidence of chronic pain, secondary to your weight.  Body Mass Index (BMI) Classification BMI level (kg/m2) Category Associated incidence of chronic pain  <18  Underweight   18.5-24.9 Ideal body weight   25-29.9 Overweight  20%  30-34.9 Obese (Class I)  68%  35-39.9 Severe obesity (Class II)  136%  >40 Extreme obesity (Class III)  254%   In addition: You will be considered "Morbidly Obese", if your BMI is above 30 and you have one or more of the following conditions which are known to be caused and/or directly associated with obesity: 1.    Type 2 Diabetes (Which in turn can lead to cardiovascular diseases (CVD), stroke, peripheral vascular diseases (PVD), retinopathy, nephropathy, and neuropathy) 2.    Cardiovascular Disease (High Blood Pressure; Congestive Heart Failure; High Cholesterol; Coronary Artery Disease; Angina; or History of Heart Attacks) 3.    Breathing problems (Asthma; obesity-hypoventilation syndrome; obstructive sleep apnea; chronic inflammatory airway disease; reactive airway disease; or shortness of breath) 4.    Chronic kidney disease 5.    Liver disease (nonalcoholic fatty liver disease) 6.    High blood pressure 7.    Acid reflux (gastroesophageal reflux disease; heartburn) 8.    Osteoarthritis (OA) (with any of the following: hip pain; knee pain; and/or low back pain) 9.    Low back pain (Lumbar Facet Syndrome; and/or Degenerative Disc Disease) 10.  Hip pain (Osteoarthritis of  hip) (For every 1 lbs of added body weight, there is a 2 lbs increase in pressure inside of each hip articulation. 1:2 mechanical relationship) 11.  Knee pain (Osteoarthritis of knee) (For every 1 lbs of added body weight, there is a 4 lbs increase in pressure inside of each knee articulation. 1:4 mechanical relationship) (patients with a BMI>30 kg/m2 were 6.8 times more likely to develop knee OA than normal-weight individuals) 12.  Cancer: Epidemiological studies have shown that obesity is a risk factor for: post-menopausal breast cancer; cancers of the endometrium, colon and kidney cancer; malignant adenomas of the oesophagus. Obese subjects have an approximately 1.5-3.5-fold increased risk of developing these cancers compared with normal-weight subjects, and it has been estimated that between 15 and 45% of these cancers can be attributed to overweight. More recent studies suggest that obesity may also increase the risk of other types of cancer, including pancreatic, hepatic and gallbladder cancer. (Ref: Obesity and cancer. Pischon T, Nthlings U, [employer] H.  Proc Nutr Soc. 2008 May;67(2):128-45. doi: 23.3007/M2263335456256389.) The International Agency for Research on Cancer (IARC) has identified 13 cancers associated with overweight and obesity: meningioma, multiple myeloma, adenocarcinoma of the esophagus, and cancers of the thyroid, postmenopausal breast cancer, gallbladder, stomach, liver, pancreas, kidney, ovaries, uterus, colon and rectal (colorectal) cancers. 74 percent of all cancers diagnosed in women and 24 percent of those diagnosed in men are associated with overweight and obesity.  Recommendation: At this point it is urgent that you take a step back and concentrate in loosing weight. Dedicate 100% of your efforts on this task. Nothing else will improve your health more than bringing your weight down and your BMI to less than 30. If you are here, you probably have chronic pain. We know that most  chronic pain patients have difficulty exercising secondary to their pain. For this reason, you must rely on proper nutrition and diet in order to lose the weight. If your BMI is above 40, you should seriously consider bariatric surgery. A realistic goal is to lose 10% of your body weight over a period of 12 months.  Be honest to yourself, if over time you have unsuccessfully tried to lose weight, then it is time for you to seek professional help and to enter a medically supervised weight management program, and/or undergo bariatric surgery. Stop procrastinating.   Pain management considerations:  1.    Pharmacological Problems: Be advised that the use of opioid analgesics (oxycodone; hydrocodone; morphine; methadone; codeine; and all of their derivatives) have been associated with decreased metabolism and weight gain.  For this reason, should we see that you are unable to lose weight while taking these medications, it may become necessary for [place] to taper down and indefinitely discontinue them.  2.    Technical Problems: The incidence of successful interventional therapies decreases as the patient's BMI increases. It is much more difficult to accomplish a safe and effective interventional therapy on a patient with a BMI above 35. 3.    Radiation Exposure Problems: The x-rays machine, used to accomplish injection therapies, will automatically increase their x-ray output in order to capture an appropriate bone image. This means that radiation exposure increases exponentially with the patient's BMI. (The higher the BMI, the higher the radiation exposure.) Although the level of radiation used at a given time is still safe to the patient, it is not for the physician and/or assisting staff. Unfortunately, radiation exposure is accumulative. Because physicians and the staff have to do procedures and be exposed on a daily basis, this can result in health problems such as cancer and radiation burns. Radiation exposure to  the staff is monitored by the radiation batches that they wear. The exposure levels are reported back to the staff on a quarterly basis. Depending on levels of exposure, physicians and staff may be obligated by law to decrease this exposure. This means that they have the right and obligation to refuse providing therapies where they may be overexposed to radiation. For this reason, physicians may decline to offer therapies such as radiofrequency ablation or implants to patients with a BMI above 40. 4.    Current Trends: Be advised that the current trend is to no longer offer certain therapies to patients with a BMI equal to, or above 35, due to increase perioperative risks, increased technical procedural difficulties, and excessive radiation exposure to healthcare personnel.  ______________________________________________________________________________________________

## 2021-03-09 NOTE — Progress Notes (Signed)
Safety precautions to be maintained throughout the outpatient stay will include: orient to surroundings, keep bed in low position, maintain call bell within reach at all times, provide assistance with transfer out of bed and ambulation.  

## 2021-03-15 ENCOUNTER — Ambulatory Visit (INDEPENDENT_AMBULATORY_CARE_PROVIDER_SITE_OTHER): Payer: Medicaid Other | Admitting: Podiatry

## 2021-03-15 ENCOUNTER — Other Ambulatory Visit: Payer: Self-pay

## 2021-03-15 ENCOUNTER — Encounter: Payer: Self-pay | Admitting: Podiatry

## 2021-03-15 DIAGNOSIS — M722 Plantar fascial fibromatosis: Secondary | ICD-10-CM | POA: Diagnosis not present

## 2021-03-15 LAB — COMPLIANCE DRUG ANALYSIS, UR

## 2021-03-15 NOTE — Progress Notes (Signed)
Subjective:  Patient ID: Cathy Trevino, female    DOB: [birth year],  MRN: [id number]  Chief Complaint  Patient presents with  . Foot Pain    Patient presents today for right heel/arch pain x 3 months.  She says its very painful in the mornings when first getting up and standing from sitting    35 y.o. female presents with the above complaint.  Patient presents with heel and arch pain that has been on for 3 months has progressive gotten worse.  It hurts when first getting up and standing from sitting position.  Patient states it painful to touch.  She constantly works on her foot.  She would like to discuss treatment options for this.  She has not tried anything for it.  She states that she is tried some over-the-counter inserts muscle rubs icy patch but none of which has helped.  She had an x-ray done at an outside facility as well.  She denies any other acute complaints.   Review of Systems: Negative except as noted in the HPI. Denies N/V/F/Ch.  Past Medical History:  Diagnosis Date  . Anemia   . Anxiety   . Asthma   . Depression   . Menorrhagia     Current Outpatient Medications:  .  acetaminophen (TYLENOL) 500 MG tablet, Take 500 mg by mouth every 6 (six) hours as needed., Disp: , Rfl:  .  cetirizine (ZYRTEC) 10 MG tablet, Cetirizine HCl 10 MG Oral Tablet QTY: 30 tablet Days: 30 Refills: 3  Written: 02/10/20 Patient Instructions: Take 1 tablet by mouth nightly at bedtime as needed for seasonal allergies, Disp: , Rfl:  .  EPINEPHrine 0.3 mg/0.3 mL IJ SOAJ injection, Inject 0.3 mLs into the muscle See admin instructions., Disp: , Rfl:  .  ergocalciferol (VITAMIN D2) 1.25 MG (50000 UT) capsule, Take 50,000 Units by mouth once a week., Disp: , Rfl:  .  fluticasone (FLONASE) 50 MCG/ACT nasal spray, Place into both nostrils daily., Disp: , Rfl:  .  levonorgestrel (MIRENA) 20 MCG/24HR IUD, 1 each by Intrauterine route once., Disp: , Rfl:  .  Multiple Vitamin (MULTIVITAMIN ADULT PO), Take  1 tablet by mouth daily at 8 pm., Disp: , Rfl:  .  NON FORMULARY, Allergy shots weekly, Disp: , Rfl:  .  tiZANidine (ZANAFLEX) 2 MG tablet, Take by mouth daily as needed for muscle spasms (at bedtime)., Disp: , Rfl:  .  VITAMIN B1-B12 IM, Inject into the muscle every 30 (thirty) days., Disp: , Rfl:   Social History   Tobacco Use  Smoking Status Former Smoker  . Packs/day: 0.25  . Years: 5.00  . Pack years: 1.25  . Types: Cigarettes  . Quit date: 02/22/2016  . Years since quitting: 5.0  Smokeless Tobacco Never Used    Allergies  Allergen Reactions  . Penicillins Rash   Objective:  There were no vitals filed for this visit. There is no height or weight on file to calculate BMI. Constitutional Well developed. Well nourished.  Vascular Dorsalis pedis pulses palpable bilaterally. Posterior tibial pulses palpable bilaterally. Capillary refill normal to all digits.  No cyanosis or clubbing noted. Pedal hair growth normal.  Neurologic Normal speech. Oriented to person, place, and time. Epicritic sensation to light touch grossly present bilaterally.  Dermatologic Nails well groomed and normal in appearance. No open wounds. No skin lesions.  Orthopedic: Normal joint ROM without pain or crepitus bilaterally. No visible deformities. Tender to palpation at the calcaneal tuber right. No pain with  calcaneal squeeze right. Ankle ROM diminished range of motion right. Silfverskiold Test: positive right.   Radiographs: Taken and reviewed. No acute fractures or dislocations. No evidence of stress fracture.  Plantar heel spur present. Posterior heel spur absent.   Assessment:   1. Plantar fasciitis of right foot    Plan:  Patient was evaluated and treated and all questions answered.  Plantar Fasciitis, right - XR reviewed as above.  - Educated on icing and stretching. Instructions given.  - Injection delivered to the plantar fascia as below. - DME: Plantar Fascial Brace -  Pharmacologic management: None  Procedure: Injection Tendon/Ligament Location: Right plantar fascia at the glabrous junction; medial approach. Skin Prep: alcohol Injectate: 0.5 cc 0.5% marcaine plain, 0.5 cc of 1% Lidocaine, 0.5 cc kenalog 10. Disposition: Patient tolerated procedure well. Injection site dressed with a band-aid.  No follow-ups on file.

## 2021-03-22 LAB — COMP. METABOLIC PANEL (12)
AST: 21 IU/L (ref 0–40)
Albumin/Globulin Ratio: 1.5 (ref 1.2–2.2)
Albumin: 4.4 g/dL (ref 3.8–4.8)
Alkaline Phosphatase: 65 IU/L (ref 44–121)
BUN/Creatinine Ratio: 17 (ref 9–23)
BUN: 11 mg/dL (ref 6–20)
Bilirubin Total: 0.4 mg/dL (ref 0.0–1.2)
Calcium: 9.6 mg/dL (ref 8.7–10.2)
Chloride: 101 mmol/L (ref 96–106)
Creatinine, Ser: 0.66 mg/dL (ref 0.57–1.00)
Globulin, Total: 2.9 g/dL (ref 1.5–4.5)
Glucose: 86 mg/dL (ref 65–99)
Potassium: 4.5 mmol/L (ref 3.5–5.2)
Sodium: 141 mmol/L (ref 134–144)
Total Protein: 7.3 g/dL (ref 6.0–8.5)
eGFR: 117 mL/min/{1.73_m2} (ref >59)

## 2021-03-22 LAB — SEDIMENTATION RATE: Sed Rate: 17 mm/hr (ref 0–32)

## 2021-03-22 LAB — 25-HYDROXY VITAMIN D LCMS D2+D3
25-Hydroxy, Vitamin D-2: 48 ng/mL
25-Hydroxy, Vitamin D-3: 4.2 ng/mL
25-Hydroxy, Vitamin D: 52 ng/mL

## 2021-03-22 LAB — MAGNESIUM: Magnesium: 2 mg/dL (ref 1.6–2.3)

## 2021-03-22 LAB — C-REACTIVE PROTEIN: CRP: 6 mg/L (ref 0–10)

## 2021-03-22 LAB — VITAMIN B12: Vitamin B-12: 328 pg/mL (ref 232–1245)

## 2021-04-12 ENCOUNTER — Other Ambulatory Visit: Payer: Self-pay

## 2021-04-12 ENCOUNTER — Ambulatory Visit: Payer: Medicaid Other | Admitting: Podiatry

## 2021-04-12 ENCOUNTER — Encounter: Payer: Self-pay | Admitting: Podiatry

## 2021-04-12 DIAGNOSIS — M7731 Calcaneal spur, right foot: Secondary | ICD-10-CM

## 2021-04-12 DIAGNOSIS — M722 Plantar fascial fibromatosis: Secondary | ICD-10-CM

## 2021-04-12 NOTE — Progress Notes (Signed)
PROVIDER NOTE: Information contained herein reflects review and annotations entered in association with encounter. Interpretation of such information and data should be left to medically-trained personnel. Information provided to patient can be located elsewhere in the medical record under "Patient Instructions". Document created using STT-dictation technology, any transcriptional errors that may result from process are unintentional.    Patient: Cathy Trevino  Service Category: E/M  Provider: Gaspar Cola, MD  DOB: 11/08/1986  DOS: 04/13/2021  Specialty: Interventional Pain Management  MRN: [id number]  Setting: Ambulatory outpatient  PCP: Danelle Berry, NP  Type: Established Patient    Referring Provider: Danelle Berry, NP  Location: Office  Delivery: Face-to-face     Primary Reason(s) for Visit: Encounter for evaluation before starting new chronic pain management plan of care (Level of risk: moderate) CC: Back Pain  HPI  Cathy Trevino is a 35 y.o. year old, female patient, who comes today for a follow-up evaluation to review the test results and decide on a treatment plan. 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); Hyperlipidemia; Obesity; Other B-complex deficiencies; Other headache syndrome; Rash and other nonspecific skin eruption; Vitamin D deficiency; Chronic pain syndrome; Pharmacologic therapy; Disorder of skeletal system; Problems influencing health status; Abnormal MRI, lumbar spine (07/15/2018); Chronic low back pain (1ry area of Pain) (Bilateral) (R>L) w/o sciatica; Lumbosacral facet syndrome (Bilateral); Chronic sacroiliac joint pain (Bilateral) (R>L); DDD (degenerative  disc disease), lumbosacral; Morbid obesity with BMI of 40.0-44.9, adult ([place]); Levoscoliosis of lumbar spine; 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 Her primarily concern today is the Back Pain  Pain Assessment: Location: Lower Back Radiating: states that iot only radiates when she is sitting down for over 20 mins. to her thighs Onset: More than a month ago Duration: Chronic pain Quality: Aching,Tingling,Stabbing,Constant Severity: 8 /10 (subjective, self-reported pain score)  Effect on ADL: prolonged standing and standing Timing: Constant Modifying factors: "Nothing" "I have tried many things" BP: 106/74  HR: 73  Cathy Trevino comes in today for a follow-up visit after her initial evaluation on 03/09/2021. Today we went over the results of her tests. These were explained in "Layman's terms". During today's appointment we went over my diagnostic impression, as well as the proposed treatment plan.  According to the patient her primary area of pain is that of the lower back (Bilateral) (R>L), which she has been experiencing since February 2021.  The patient denies any prior back surgeries, nerve blocks, or recent x-rays.  She does admit having had physical therapy around October 2021 which initially seemed to have helped but then after a while it stopped helping.  The patient's secondary area of discomfort is that of the lower extremities (Bilateral) (R>L), where she is experiencing a tingling sensation that runs through the posterior aspect of both legs down to mid thigh (hamstrings).  She denies any weakness or numbness of the lower extremities.  She also denies any pain going down into her feet.  According to the patient she has had this treated with physical therapy and Tylenol.  Her understanding is that she has "degenerative disc  disease".  The patient also  indicates not having had any recent significant weight changes.  Physical exam: The patient was able to toe walk and heel walk without any difficulties.  Strength raise was within normal limits bilaterally.  Hyperextension of the lumbar spine trigger pain across the lower back.  Hyperextension or rotation of the lumbar spine as well as the [facility] maneuver were both positive for bilateral lumbar facet arthralgia.  Figure 4 maneuver/Patrick maneuver was positive bilaterally for sacroiliac joint arthralgia and negative for any type of hip discomfort or decreased range of motion.  Today I have reviewed the results of her lab work and x-rays both of which I shared with the patient in detail.  I have explained everything to her in layman's terms.  Based on the above information we have decided to proceed with a diagnostic bilateral lumbar facet block under fluoroscopic guidance and IV sedation.  The patient was informed of the plan which she understood and accepted.  In considering the treatment plan options, Cathy Trevino was reminded that I no longer take patients for medication management only. I asked her to let me know if she had no intention of taking advantage of the interventional therapies, so that we could make arrangements to provide this space to someone interested. I also made it clear that undergoing interventional therapies for the purpose of getting pain medications is very inappropriate on the part of a patient, and it will not be tolerated in this practice. This type of behavior would suggest true addiction and therefore it requires referral to an addiction specialist.   Further details on both, my assessment(s), as well as the proposed treatment plan, please see below.  Controlled Substance Pharmacotherapy Assessment REMS (Risk Evaluation and Mitigation Strategy)  Analgesic: None MME/day: 0 mg/day  Pill Count: None expected due to no prior prescriptions written by our  practice. [name], RN  04/13/2021  9:12 AM  Sign when Signing Visit Safety precautions to be maintained throughout the outpatient stay will include: orient to surroundings, keep bed in low position, maintain call bell within reach at all times, provide assistance with transfer out of bed and ambulation.    Pharmacokinetics: Liberation and absorption (onset of action): WNL Distribution (time to peak effect): WNL Metabolism and excretion (duration of action): WNL         Pharmacodynamics: Desired effects: Analgesia: Ms. Robak reports >50% benefit. Functional ability: Patient reports that medication allows her to accomplish basic ADLs Clinically meaningful improvement in function (CMIF): Sustained CMIF goals met Perceived effectiveness: Described as relatively effective, allowing for increase in activities of daily living (ADL) Undesirable effects: Side-effects or Adverse reactions: None reported Monitoring: [place] PMP: PDMP reviewed during this encounter. Online review of the past 63-monthperiod previously conducted. Not applicable at this point since we have not taken over the patient's medication management yet. List of other Serum/Urine Drug Screening Test(s):  No results found for: AMPHSCRSER, BARBSCRSER, BENZOSCRSER, COCAINSCRSER, COCAINSCRNUR, PCPSCRSER, THCSCRSER, THCU, CANNABQUANT, [place] Valley[place] of all UDS test(s) done:  Lab Results  Component Value Date   SUMMARY Note 03/09/2021   Last UDS on record: Summary  Date Value Ref Range Status  03/09/2021 Note  Final    Comment:    ==================================================================== Compliance Drug Analysis, Ur ==================================================================== Test                             Result       Flag  Units  Drug Absent but Declared for Prescription Verification   Tizanidine                     Not Detected UNEXPECTED    Tizanidine, as  indicated in the declared medication list, is not    always detected even when used as directed.    Acetaminophen                  Not Detected UNEXPECTED    Acetaminophen, as indicated in the declared medication list, is not    always detected even when used as directed.    Diclofenac                     Not Detected UNEXPECTED    Diclofenac, as indicated in the declared medication list, is not    always detected even when used as directed.  ==================================================================== Test                      Result    Flag   Units      Ref Range   Creatinine              86               mg/dL      >=20 ==================================================================== Declared Medications:  The flagging and interpretation on this report are based on the  following declared medications.  Unexpected results may arise from  inaccuracies in the declared medications.   **Note: The testing scope of this panel does not include small to  moderate amounts of these reported medications:   Acetaminophen (Tylenol)  Diclofenac (Voltaren)  Tizanidine   **Note: The testing scope of this panel does not include the  following reported medications:   Celecoxib (Celebrex)  Cetirizine (Zyrtec)  Epinephrine (EpiPen)  Fluticasone (Flonase)  Levonorgestrel (Mirena)  Multivitamin  Undefined Miscellaneous Drug  Vitamin B1  Vitamin B12  Vitamin D2 ==================================================================== For clinical consultation, please call [phone]. ====================================================================    UDS interpretation: No unexpected findings.          Medication Assessment Form: Patient introduced to form today Treatment compliance: Treatment may start today if patient agrees with proposed plan. Evaluation of compliance is not applicable at this point Risk Assessment Profile: Aberrant behavior: See initial evaluations.  None observed or detected today Comorbid factors increasing risk of overdose: See initial evaluation. No additional risks detected today Opioid risk tool (ORT):  Opioid Risk  04/13/2021  Alcohol 0  Illegal Drugs 0  Rx Drugs 0  Alcohol 0  Illegal Drugs 0  Rx Drugs -  History of Preadolescent Sexual Abuse 0  Psychological Disease 0  Depression 0  Opioid Risk Tool Scoring 0  Opioid Risk Interpretation Low Risk    ORT Scoring interpretation table:  Score <3 = Low Risk for SUD  Score between 4-7 = Moderate Risk for SUD  Score >8 = High Risk for Opioid Abuse   Risk of substance use disorder (SUD): Low  Risk Mitigation Strategies:  Patient opioid safety counseling: Not applicable. Patient-Prescriber Agreement (PPA): No agreement signed.  Controlled substance notification to other providers: Not applicable  Pharmacologic Plan: No opioid analgesic prescribed.             Laboratory Chemistry Profile   Renal Lab Results  Component Value Date   BUN 11 03/09/2021   CREATININE 0.66 03/09/2021   BCR 17 03/09/2021   GFRAA >60  11/27/2017   GFRNONAA >60 11/27/2017   PROTEINUR 100 (A) 11/27/2017     Electrolytes Lab Results  Component Value Date   NA 141 03/09/2021   K 4.5 03/09/2021   CL 101 03/09/2021   CALCIUM 9.6 03/09/2021   MG 2.0 03/09/2021     Hepatic Lab Results  Component Value Date   AST 21 03/09/2021   ALT 12 (L) 11/27/2017   ALBUMIN 4.4 03/09/2021   ALKPHOS 65 03/09/2021   LIPASE 23 11/27/2017     ID Lab Results  Component Value Date   PREGTESTUR NEGATIVE 11/27/2017     Bone Lab Results  Component Value Date   25OHVITD1 52 03/09/2021   25OHVITD2 48 03/09/2021   25OHVITD3 4.2 03/09/2021     Endocrine Lab Results  Component Value Date   GLUCOSE 86 03/09/2021   GLUCOSEU NEGATIVE 11/27/2017     Neuropathy Lab Results  Component Value Date   VITAMINB12 328 03/09/2021     CNS No results found for: COLORCSF, APPEARCSF, RBCCOUNTCSF, WBCCSF,  POLYSCSF, LYMPHSCSF, EOSCSF, PROTEINCSF, GLUCCSF, JCVIRUS, CSFOLI, IGGCSF, LABACHR, ACETBL, LABACHR, ACETBL   Inflammation (CRP: Acute  ESR: Chronic) Lab Results  Component Value Date   CRP 6 03/09/2021   ESRSEDRATE 17 03/09/2021     Rheumatology No results found for: RF, ANA, LABURIC, URICUR, LYMEIGGIGMAB, LYMEABIGMQN, HLAB27   Coagulation Lab Results  Component Value Date   PLT 217 06/10/2019     Cardiovascular Lab Results  Component Value Date   TROPONINI < 0.02 10/28/2014   HGB 14.1 06/10/2019   HCT 42.3 06/10/2019     Screening Lab Results  Component Value Date   PREGTESTUR NEGATIVE 11/27/2017     Cancer No results found for: CEA, CA125, LABCA2   Allergens No results found for: ALMOND, APPLE, ASPARAGUS, AVOCADO, BANANA, BARLEY, BASIL, BAYLEAF, GREENBEAN, LIMABEAN, WHITEBEAN, BEEFIGE, REDBEET, BLUEBERRY, BROCCOLI, CABBAGE, MELON, CARROT, CASEIN, CASHEWNUT, CAULIFLOWER, CELERY     Note: Lab results reviewed.  Recent Diagnostic Imaging Review  Lumbosacral Imaging: Lumbar MR w/wo contrast: Results for orders placed during the hospital encounter of 07/15/18 MR Lumbar Spine W Wo Contrast  Narrative CLINICAL DATA:  Initial evaluation for central low back pain for 2 months, worsening.  EXAM: MRI LUMBAR SPINE WITHOUT AND WITH CONTRAST  TECHNIQUE: Multiplanar and multiecho pulse sequences of the lumbar spine were obtained without and with intravenous contrast.  CONTRAST:  65m MULTIHANCE GADOBENATE DIMEGLUMINE 529 MG/ML IV SOLN  COMPARISON:  None available.  FINDINGS: Segmentation: Normal segmentation. Lowest well-formed disc labeled the L5-S1 level.  Alignment: Mild levoscoliosis. Vertebral bodies otherwise normally aligned with preservation of the normal lumbar lordosis.  Vertebrae: Vertebral body heights maintained without evidence for acute or chronic fracture. Bone marrow signal intensity diffusely decreased on T1 weighted imaging, most commonly  related to anemia, smoking, or obesity. No discrete or worrisome osseous lesions. No abnormal marrow edema or enhancement.  Conus medullaris and cauda equina: Conus extends to the L1 level. Conus and cauda equina appear normal.  Paraspinal and other soft tissues: Paraspinous soft tissues within normal limits. Visualized visceral structures unremarkable.  Disc levels:  No significant findings are seen through the L4-5 level.  L5-S1: Disc desiccation. Broad central/left subarticular disc protrusion extends into the left lateral recess, impinging upon the descending left S1 nerve root which is slightly displaced posteriorly (series 5, image 37). Moderate left lateral recess stenosis. Central canal remains widely patent. No significant foraminal encroachment.  IMPRESSION: 1. Central/left subarticular disc protrusion at L5-S1 extending into the  left lateral recess and impinging upon the descending left S1 nerve root. 2. Diffusely decreased T1 signal throughout the visualized bone marrow, nonspecific, but suspected to be related to patient's history of anemia. 3. Otherwise normal MRI of the lumbar spine.   Electronically Signed By: Jeannine Boga M.D. On: 07/15/2018 22:58  Lumbar DG Bending views: Results for orders placed during the hospital encounter of 03/09/21 DG Lumbar Spine Complete W/Bend  Narrative CLINICAL DATA:  Low back pain with numbness and tingling in both legs for 2 years. No known injury.  EXAM: LUMBAR SPINE - COMPLETE WITH BENDING VIEWS  COMPARISON:  None.  FINDINGS: There is no evidence of lumbar spine fracture. Alignment is normal. Intervertebral disc spaces are maintained. Mild facet degenerative change L4-5 and L5-S1 noted. IUD is also noted.  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 Otherwise negative.   Electronically Signed By: Inge Rise M.D. On: 03/10/2021 09:58  Sacroiliac Joint  Imaging: Sacroiliac Joint DG: Results for orders placed during the hospital encounter of 03/09/21 DG Si Joints  Narrative CLINICAL DATA:  Low back pain with numbness and tingling in both legs for 2 years.  EXAM: BILATERAL SACROILIAC JOINTS - 3+ VIEW  COMPARISON:  None.  FINDINGS: The sacroiliac joint spaces are maintained and there is no evidence of arthropathy. No other bone abnormalities are seen. IUD noted.  IMPRESSION: Normal exam.   Electronically Signed By: Inge Rise M.D. On: 03/10/2021 10:00  Ankle Imaging: Ankle-L DG Complete: Results for orders placed during the hospital encounter of 08/06/15 DG Ankle Complete Left  Narrative CLINICAL DATA:  [place] walking up steps. Complains of medial ankle pain.  EXAM: LEFT ANKLE COMPLETE - 3+ VIEW  COMPARISON:  None.  FINDINGS: Negative for a fracture or dislocation. No significant soft tissue swelling. Tiny spur along the plantar aspect of the calcaneus. Normal alignment of the left ankle.  IMPRESSION: No acute abnormality.   Electronically Signed By: Markus Daft M.D. On: 08/06/2015 13:50  Wrist Imaging: Wrist-L DG Complete: Results for orders placed during the hospital encounter of 07/20/15 DG Wrist Complete Left  Narrative CLINICAL DATA:  Twisting injury to left wrist while picking up child. Left wrist pain and swelling. Initial encounter.  EXAM: LEFT WRIST - COMPLETE 3+ VIEW  COMPARISON:  None.  FINDINGS: There is no evidence of fracture or dislocation. The carpal rows are intact, and demonstrate normal alignment. The joint spaces are preserved.  No significant soft tissue abnormalities are seen.  IMPRESSION: No evidence of fracture or dislocation.  Electronically Signed By: Garald Balding M.D. On: 07/20/2015 19:20  Complexity Note: Imaging results reviewed. Results shared with Ms. Runion, using Layman's terms.                        Meds   Current Outpatient Medications:  .   acetaminophen (TYLENOL) 500 MG tablet, Take 500 mg by mouth every 6 (six) hours as needed., Disp: , Rfl:  .  cetirizine (ZYRTEC) 10 MG tablet, Cetirizine HCl 10 MG Oral Tablet QTY: 30 tablet Days: 30 Refills: 3  Written: 02/10/20 Patient Instructions: Take 1 tablet by mouth nightly at bedtime as needed for seasonal allergies, Disp: , Rfl:  .  EPINEPHrine 0.3 mg/0.3 mL IJ SOAJ injection, Inject 0.3 mLs into the muscle See admin instructions., Disp: , Rfl:  .  ergocalciferol (VITAMIN D2) 1.25 MG (50000 UT) capsule, Take 50,000 Units by mouth once a week., Disp: , Rfl:  .  fluticasone (FLONASE) 50 MCG/ACT nasal spray, Place into both nostrils daily., Disp: , Rfl:  .  levonorgestrel (MIRENA) 20 MCG/24HR IUD, 1 each by Intrauterine route once., Disp: , Rfl:  .  Multiple Vitamin (MULTIVITAMIN ADULT PO), Take 1 tablet by mouth daily at 8 pm., Disp: , Rfl:  .  NON FORMULARY, Allergy shots weekly, Disp: , Rfl:  .  VITAMIN B1-B12 IM, Inject into the muscle every 30 (thirty) days., Disp: , Rfl:   ROS  Constitutional: Denies any fever or chills Gastrointestinal: No reported hemesis, hematochezia, vomiting, or acute GI distress Musculoskeletal: Denies any acute onset joint swelling, redness, loss of ROM, or weakness Neurological: No reported episodes of acute onset apraxia, aphasia, dysarthria, agnosia, amnesia, paralysis, loss of coordination, or loss of consciousness  Allergies  Ms. Rochefort is allergic to penicillins.  [place]  Drug: Ms. Mahurin  reports no history of drug use. Alcohol:  reports no history of alcohol use. Tobacco:  reports that she quit smoking about 5 years ago. Her smoking use included cigarettes. She has a 1.25 pack-year smoking history. She has never used smokeless tobacco. Medical:  has a past medical history of Anemia, Anxiety, Asthma, Depression, and Menorrhagia. Surgical: Ms. Miranda  has a past surgical history that includes Cesarean section (2012) and Tubal ligation. Family: family  history includes Heart failure in her mother; Hypertension in her mother.  Constitutional Exam  General appearance: Well nourished, well developed, and well hydrated. In no apparent acute distress Vitals:   04/13/21 0903  BP: 106/74  Pulse: 73  Resp: 16  Temp: (!) 97.1 F (36.2 C)  SpO2: 98%  Weight: 252 lb (114.3 kg)  Height: 5' 4"  (1.626 m)   BMI Assessment: Estimated body mass index is 43.26 kg/m as calculated from the following:   Height as of this encounter: 5' 4"  (1.626 m).   Weight as of this encounter: 252 lb (114.3 kg).  BMI interpretation table: BMI level Category Range association with higher incidence of chronic pain  <18 kg/m2 Underweight   18.5-24.9 kg/m2 Ideal body weight   25-29.9 kg/m2 Overweight Increased incidence by 20%  30-34.9 kg/m2 Obese (Class I) Increased incidence by 68%  35-39.9 kg/m2 Severe obesity (Class II) Increased incidence by 136%  >40 kg/m2 Extreme obesity (Class III) Increased incidence by 254%   Patient's current BMI Ideal Body weight  Body mass index is 43.26 kg/m. Ideal body weight: 54.7 kg (120 lb 9.5 oz) Adjusted ideal body weight: 78.5 kg (173 lb 2.5 oz)   BMI Readings from Last 4 Encounters:  04/13/21 43.26 kg/m  03/09/21 43.26 kg/m  04/07/20 43.26 kg/m  11/06/19 42.74 kg/m   Wt Readings from Last 4 Encounters:  04/13/21 252 lb (114.3 kg)  03/09/21 252 lb (114.3 kg)  04/07/20 252 lb (114.3 kg)  11/06/19 249 lb (112.9 kg)    Psych/Mental status: Alert, oriented x 3 (person, place, & time)       Eyes: PERLA Respiratory: No evidence of acute respiratory distress  Assessment & Plan  Primary Diagnosis & Pertinent Problem List: The primary encounter diagnosis was Lumbosacral facet syndrome (Bilateral). Diagnoses of Osteoarthritis of facet joint of lumbar spine, Spondylosis without myelopathy or radiculopathy, lumbosacral region, Chronic low back pain (1ry area of Pain) (Bilateral) (R>L) w/o sciatica, and DDD (degenerative  disc disease), lumbosacral were also pertinent to this visit.  Visit Diagnosis: 1. Lumbosacral facet syndrome (Bilateral)   2. Osteoarthritis of facet joint of lumbar spine   3. Spondylosis without myelopathy or radiculopathy,  lumbosacral region   4. Chronic low back pain (1ry area of Pain) (Bilateral) (R>L) w/o sciatica   5. DDD (degenerative disc disease), lumbosacral    Problems updated and reviewed during this visit: Problem  Osteoarthritis of Facet Joint of Lumbar Spine  Spondylosis Without Myelopathy Or Radiculopathy, Lumbosacral Region    Plan of Care  Pharmacotherapy (Medications Ordered): No orders of the defined types were placed in this encounter.   Procedure Orders     LUMBAR FACET(MEDIAL BRANCH NERVE BLOCK) MBNB Lab Orders  No laboratory test(s) ordered today   Imaging Orders  No imaging studies ordered today   Referral Orders  No referral(s) requested today   Pharmacological management options:  Opioid Analgesics: I will not be prescribing any opioids at this time Membrane stabilizer: None prescribed at this time Muscle relaxant: None prescribed at this time NSAID: None prescribed at this time Other analgesic(s): None prescribed at this time     Interventional Therapies  Risk  Complexity Considerations:   Estimated body mass index is 43.26 kg/m as calculated from the following:   Height as of this encounter: 5' 4"  (1.626 m).   Weight as of this encounter: 252 lb (114.3 kg). WNL   Planned  Pending:   Diagnostic bilateral lumbar facet MBB #1    Under consideration:   Diagnostic bilateral lumbar facet block  Diagnostic bilateral SI joint block  Diagnostic left L5 transforaminal ESI    Completed:   None at this time   Therapeutic  Palliative (PRN) options:   None established    Provider-requested follow-up: Return for Procedure (w/ sedation): (B) L-FCT BLK #1. Recent Visits Date Type Provider Dept  03/09/21 Office Visit Milinda Pointer,  MD [facility]  Showing recent visits within past 90 days and meeting all other requirements Today's Visits Date Type Provider Dept  04/13/21 Office Visit Milinda Pointer, MD [facility]  Showing today's visits and meeting all other requirements Future Appointments No visits were found meeting these conditions. Showing future appointments within next 90 days and meeting all other requirements  Primary Care Physician: Danelle Berry, NP Note by: Gaspar Cola, MD Date: 04/13/2021; Time: 10:10 AM

## 2021-04-12 NOTE — Progress Notes (Signed)
Subjective:  Patient ID: Cathy Trevino, female    DOB: November 30, 1986,  MRN: [id number]  Chief Complaint  Patient presents with  . Plantar Fasciitis    "its a little better.  Its still really painful in the mornings"    35 y.o. female presents with the above complaint.  Patient presents with follow-up from plantar fasciitis to right foot.  Patient states that injection helped bring her pain down to about 60 to 70%.  She would like to continue doing steroid injection if needed.  She states it feels better in the morning and gets worse progressively.  She is made shoe gear modification advance.  She denies any other acute complaints.   Review of Systems: Negative except as noted in the HPI. Denies N/V/F/Ch.  Past Medical History:  Diagnosis Date  . Anemia   . Anxiety   . Asthma   . Depression   . Menorrhagia     Current Outpatient Medications:  .  acetaminophen (TYLENOL) 500 MG tablet, Take 500 mg by mouth every 6 (six) hours as needed., Disp: , Rfl:  .  cetirizine (ZYRTEC) 10 MG tablet, Cetirizine HCl 10 MG Oral Tablet QTY: 30 tablet Days: 30 Refills: 3  Written: 02/10/20 Patient Instructions: Take 1 tablet by mouth nightly at bedtime as needed for seasonal allergies, Disp: , Rfl:  .  EPINEPHrine 0.3 mg/0.3 mL IJ SOAJ injection, Inject 0.3 mLs into the muscle See admin instructions., Disp: , Rfl:  .  ergocalciferol (VITAMIN D2) 1.25 MG (50000 UT) capsule, Take 50,000 Units by mouth once a week., Disp: , Rfl:  .  fluticasone (FLONASE) 50 MCG/ACT nasal spray, Place into both nostrils daily., Disp: , Rfl:  .  levonorgestrel (MIRENA) 20 MCG/24HR IUD, 1 each by Intrauterine route once., Disp: , Rfl:  .  Multiple Vitamin (MULTIVITAMIN ADULT PO), Take 1 tablet by mouth daily at 8 pm., Disp: , Rfl:  .  NON FORMULARY, Allergy shots weekly, Disp: , Rfl:  .  tiZANidine (ZANAFLEX) 2 MG tablet, Take by mouth daily as needed for muscle spasms (at bedtime)., Disp: , Rfl:  .  VITAMIN B1-B12 IM, Inject  into the muscle every 30 (thirty) days., Disp: , Rfl:   Social History   Tobacco Use  Smoking Status Former Smoker  . Packs/day: 0.25  . Years: 5.00  . Pack years: 1.25  . Types: Cigarettes  . Quit date: 02/22/2016  . Years since quitting: 5.1  Smokeless Tobacco Never Used    Allergies  Allergen Reactions  . Penicillins Rash   Objective:  There were no vitals filed for this visit. There is no height or weight on file to calculate BMI. Constitutional Well developed. Well nourished.  Vascular Dorsalis pedis pulses palpable bilaterally. Posterior tibial pulses palpable bilaterally. Capillary refill normal to all digits.  No cyanosis or clubbing noted. Pedal hair growth normal.  Neurologic Normal speech. Oriented to person, place, and time. Epicritic sensation to light touch grossly present bilaterally.  Dermatologic Nails well groomed and normal in appearance. No open wounds. No skin lesions.  Orthopedic: Normal joint ROM without pain or crepitus bilaterally. No visible deformities. Tender to palpation at the calcaneal tuber right. No pain with calcaneal squeeze right. Ankle ROM diminished range of motion right. Silfverskiold Test: positive right.   Radiographs: Taken and reviewed. No acute fractures or dislocations. No evidence of stress fracture.  Plantar heel spur present. Posterior heel spur absent.   Assessment:   1. Plantar fasciitis of right foot  2. Heel spur, right    Plan:  Patient was evaluated and treated and all questions answered.  Plantar Fasciitis, right with underlying heel spur - XR reviewed as above.  - Educated on icing and stretching. Instructions given.  -Second injection delivered to the plantar fascia as below. - DME: None - Pharmacologic management: None -Over-the-counter orthotics were discussed with the patient in extensive detail  Procedure: Injection Tendon/Ligament Location: Right plantar fascia at the glabrous junction; medial  approach. Skin Prep: alcohol Injectate: 0.5 cc 0.5% marcaine plain, 0.5 cc of 1% Lidocaine, 0.5 cc kenalog 10. Disposition: Patient tolerated procedure well. Injection site dressed with a band-aid.  No follow-ups on file.

## 2021-04-13 ENCOUNTER — Encounter: Payer: Self-pay | Admitting: Pain Medicine

## 2021-04-13 ENCOUNTER — Ambulatory Visit: Payer: Medicaid Other | Attending: Pain Medicine | Admitting: Pain Medicine

## 2021-04-13 VITALS — BP 106/74 | HR 73 | Temp 97.1°F | Resp 16 | Ht 64.0 in | Wt 252.0 lb

## 2021-04-13 DIAGNOSIS — M47816 Spondylosis without myelopathy or radiculopathy, lumbar region: Secondary | ICD-10-CM | POA: Diagnosis present

## 2021-04-13 DIAGNOSIS — M47817 Spondylosis without myelopathy or radiculopathy, lumbosacral region: Secondary | ICD-10-CM

## 2021-04-13 DIAGNOSIS — M5137 Other intervertebral disc degeneration, lumbosacral region: Secondary | ICD-10-CM

## 2021-04-13 DIAGNOSIS — G8929 Other chronic pain: Secondary | ICD-10-CM

## 2021-04-13 DIAGNOSIS — M545 Low back pain, unspecified: Secondary | ICD-10-CM

## 2021-04-13 NOTE — Patient Instructions (Addendum)
____________________________________________________________________________________________  Preparing for Procedure with Sedation  Procedure appointments are limited to planned procedures: . No Prescription Refills. . No disability issues will be discussed. . No medication changes will be discussed.  Instructions: . Oral Intake: Do not eat or drink anything for at least 8 hours prior to your procedure. (Exception: Blood Pressure Medication. See below.) . Transportation: Unless otherwise stated by your physician, you may drive yourself after the procedure. . Blood Pressure Medicine: Do not forget to take your blood pressure medicine with a sip of water the morning of the procedure. If your Diastolic (lower reading)is above 100 mmHg, elective cases will be cancelled/rescheduled. . Blood thinners: These will need to be stopped for procedures. Notify our staff if you are taking any blood thinners. Depending on which one you take, there will be specific instructions on how and when to stop it. . Diabetics on insulin: Notify the staff so that you can be scheduled 1st case in the morning. If your diabetes requires high dose insulin, take only  of your normal insulin dose the morning of the procedure and notify the staff that you have done so. . Preventing infections: Shower with an antibacterial soap the morning of your procedure. . Build-up your immune system: Take 1000 mg of Vitamin C with every meal (3 times a day) the day prior to your procedure. . Antibiotics: Inform the staff if you have a condition or reason that requires you to take antibiotics before dental procedures. . Pregnancy: If you are pregnant, call and cancel the procedure. . Sickness: If you have a cold, fever, or any active infections, call and cancel the procedure. . Arrival: You must be in the facility at least 30 minutes prior to your scheduled procedure. . Children: Do not bring children with you. . Dress appropriately:  Bring dark clothing that you would not mind if they get stained. . Valuables: Do not bring any jewelry or valuables.  Reasons to call and reschedule or cancel your procedure: (Following these recommendations will minimize the risk of a serious complication.) . Surgeries: Avoid having procedures within 2 weeks of any surgery. (Avoid for 2 weeks before or after any surgery). . Flu Shots: Avoid having procedures within 2 weeks of a flu shots or . (Avoid for 2 weeks before or after immunizations). . Barium: Avoid having a procedure within 7-10 days after having had a radiological study involving the use of radiological contrast. (Myelograms, Barium swallow or enema study). . Heart attacks: Avoid any elective procedures or surgeries for the initial 6 months after a "Myocardial Infarction" (Heart Attack). . Blood thinners: It is imperative that you stop these medications before procedures. Let us know if you if you take any blood thinner.  . Infection: Avoid procedures during or within two weeks of an infection (including chest colds or gastrointestinal problems). Symptoms associated with infections include: Localized redness, fever, chills, night sweats or profuse sweating, burning sensation when voiding, cough, congestion, stuffiness, runny nose, sore throat, diarrhea, nausea, vomiting, cold or Flu symptoms, recent or current infections. It is specially important if the infection is over the area that we intend to treat. . Heart and lung problems: Symptoms that may suggest an active cardiopulmonary problem include: cough, chest pain, breathing difficulties or shortness of breath, dizziness, ankle swelling, uncontrolled high or unusually low blood pressure, and/or palpitations. If you are experiencing any of these symptoms, cancel your procedure and contact your primary care physician for an evaluation.  Remember:  Regular Business hours are:    Monday to Thursday 8:00 AM to 4:00 PM  Provider's  Schedule: Malak Orantes, MD:  Procedure days: Tuesday and Thursday 7:30 AM to 4:00 PM  Bilal Lateef, MD:  Procedure days: Monday and Wednesday 7:30 AM to 4:00 PM ____________________________________________________________________________________________   ____________________________________________________________________________________________  General Risks and Possible Complications  Patient Responsibilities: It is important that you read this as it is part of your informed consent. It is our duty to inform you of the risks and possible complications associated with treatments offered to you. It is your responsibility as a patient to read this and to ask questions about anything that is not clear or that you believe was not covered in this document.  Patient's Rights: You have the right to refuse treatment. You also have the right to change your mind, even after initially having agreed to have the treatment done. However, under this last option, if you wait until the last second to change your mind, you may be charged for the materials used up to that point.  Introduction: Medicine is not an exact science. Everything in Medicine, including the lack of treatment(s), carries the potential for danger, harm, or loss (which is by definition: Risk). In Medicine, a complication is a secondary problem, condition, or disease that can aggravate an already existing one. All treatments carry the risk of possible complications. The fact that a side effects or complications occurs, does not imply that the treatment was conducted incorrectly. It must be clearly understood that these can happen even when everything is done following the highest safety standards.  No treatment: You can choose not to proceed with the proposed treatment alternative. The "PRO(s)" would include: avoiding the risk of complications associated with the therapy. The "CON(s)" would include: not getting any of the treatment  benefits. These benefits fall under one of three categories: diagnostic; therapeutic; and/or palliative. Diagnostic benefits include: getting information which can ultimately lead to improvement of the disease or symptom(s). Therapeutic benefits are those associated with the successful treatment of the disease. Finally, palliative benefits are those related to the decrease of the primary symptoms, without necessarily curing the condition (example: decreasing the pain from a flare-up of a chronic condition, such as incurable terminal cancer).  General Risks and Complications: These are associated to most interventional treatments. They can occur alone, or in combination. They fall under one of the following six (6) categories: no benefit or worsening of symptoms; bleeding; infection; nerve damage; allergic reactions; and/or death. 1. No benefits or worsening of symptoms: In Medicine there are no guarantees, only probabilities. No healthcare provider can ever guarantee that a medical treatment will work, they can only state the probability that it may. Furthermore, there is always the possibility that the condition may worsen, either directly, or indirectly, as a consequence of the treatment. 2. Bleeding: This is more common if the patient is taking a blood thinner, either prescription or over the counter (example: Goody Powders, Fish oil, Aspirin, Garlic, etc.), or if suffering a condition associated with impaired coagulation (example: Hemophilia, cirrhosis of the liver, low platelet counts, etc.). However, even if you do not have one on these, it can still happen. If you have any of these conditions, or take one of these drugs, make sure to notify your treating physician. 3. Infection: This is more common in patients with a compromised immune system, either due to disease (example: diabetes, cancer, human immunodeficiency virus [HIV], etc.), or due to medications or treatments (example: therapies used to treat  cancer and   rheumatological diseases). However, even if you do not have one on these, it can still happen. If you have any of these conditions, or take one of these drugs, make sure to notify your treating physician. 4. Nerve Damage: This is more common when the treatment is an invasive one, but it can also happen with the use of medications, such as those used in the treatment of cancer. The damage can occur to small secondary nerves, or to large primary ones, such as those in the spinal cord and brain. This damage may be temporary or permanent and it may lead to impairments that can range from temporary numbness to permanent paralysis and/or brain death. 5. Allergic Reactions: Any time a substance or material comes in contact with our body, there is the possibility of an allergic reaction. These can range from a mild skin rash (contact dermatitis) to a severe systemic reaction (anaphylactic reaction), which can result in death. 6. Death: In general, any medical intervention can result in death, most of the time due to an unforeseen complication. ____________________________________________________________________________________________   Facet Joint Block The facet joints connect the bones of the spine (vertebrae). They make it possible for you to bend, twist, and make other movements with your spine. They also keep you from bending too far, twisting too far, and making other extreme movements. A facet joint block is a procedure in which a numbing medicine (anesthetic) is injected into a facet joint. In many cases, an anti-inflammatory medicine (steroid) is also injected. A facet joint block may be done:  To diagnose neck or back pain. If the pain gets better after a facet joint block, it means the pain is probably coming from the facet joint. If the pain does not get better, it means the pain is probably not coming from the facet joint.  To relieve neck or back pain that is caused by an inflamed  facet joint. A facet joint block is only done to relieve pain if the pain does not improve with other methods, such as medicine, exercise programs, and physical therapy. Tell a health care provider about:  Any allergies you have.  All medicines you are taking, including vitamins, herbs, eye drops, creams, and over-the-counter medicines.  Any problems you or family members have had with anesthetic medicines.  Any blood disorders you have.  Any surgeries you have had.  Any medical conditions you have or have had.  Whether you are pregnant or may be pregnant. What are the risks? Generally, this is a safe procedure. However, problems may occur, including:  Bleeding.  Injury to a nerve near the injection site.  Pain at the injection site.  Weakness or numbness in areas controlled by nerves near the injection site.  Infection.  Temporary fluid retention.  Allergic reactions to medicines or dyes.  Injury to other structures or organs near the injection site. What happens before the procedure? Medicines Ask your health care provider about:  Changing or stopping your regular medicines. This is especially important if you are taking diabetes medicines or blood thinners.  Taking medicines such as aspirin and ibuprofen. These medicines can thin your blood. Do not take these medicines unless your health care provider tells you to take them.  Taking over-the-counter medicines, vitamins, herbs, and supplements. Eating and drinking Follow instructions from your health care provider about eating and drinking, which may include:  8 hours before the procedure - stop eating heavy meals or foods, such as meat, fried foods, or fatty foods.    6 hours before the procedure - stop eating light meals or foods, such as toast or cereal.  6 hours before the procedure - stop drinking milk or drinks that contain milk.  2 hours before the procedure - stop drinking clear liquids. Staying  hydrated Follow instructions from your health care provider about hydration, which may include:  Up to 2 hours before the procedure - you may continue to drink clear liquids, such as water, clear fruit juice, black coffee, and plain tea. General instructions  Do not use any products that contain nicotine or tobacco for at least 4-6 weeks before the procedure. These products include cigarettes, e-cigarettes, and chewing tobacco. If you need help quitting, ask your health care provider.  Plan to have someone take you home from the hospital or clinic.  Ask your health care provider: ? How your surgery site will be marked. ? What steps will be taken to help prevent infection. These may include:  Removing hair at the surgery site.  Washing skin with a germ-killing soap.  Receiving antibiotic medicine. What happens during the procedure?  You will put on a hospital gown.  You will lie on your stomach on an X-ray table. You may be asked to lie in a different position if an injection will be made in your neck.  Machines will be used to monitor your oxygen levels, heart rate, and blood pressure.  Your skin will be cleaned.  If an injection will be made in your neck, an IV will be inserted into one of your veins. Fluids and medicine will flow directly into your body through the IV.  A numbing medicine (local anesthetic) will be applied to your skin. Your skin may sting or burn for a moment.  A video X-ray machine (fluoroscopy) will be used to find the joint. In some cases, a CT scan may be used.  A contrast dye may be injected into the facet joint area to help find the joint.  When the joint is located, an anesthetic will be injected into the joint through the needle.  Your health care provider will ask you whether you feel pain relief. ? If you feel relief, a steroid may be injected to provide pain relief for a longer period of time. ? If you do not feel relief or feel only partial  relief, additional injections of an anesthetic may be made in other facet joints.  The needle will be removed.  Your skin will be cleaned.  A bandage (dressing) will be applied over each injection site. The procedure may vary among health care providers and hospitals.   What happens after the procedure?  Your blood pressure, heart rate, breathing rate, and blood oxygen level will be monitored until you leave the hospital or clinic.  You will lie down and rest for a period of time. Summary  A facet joint block is a procedure in which a numbing medicine (anesthetic) is injected into a facet joint. An anti-inflammatory medicine (stereoid) may also be injected.  Follow instructions from your health care provider about medicines and eating and drinking before the procedure.  Do not use any products that contain nicotine or tobacco for at least 4-6 weeks before the procedure.  You will lie on your stomach for the procedure, but you may be asked to lie in a different position if an injection will be made in your neck.  When the joint is located, an anesthetic will be injected into the joint through the needle. This

## 2021-04-13 NOTE — Progress Notes (Signed)
Safety precautions to be maintained throughout the outpatient stay will include: orient to surroundings, keep bed in low position, maintain call bell within reach at all times, provide assistance with transfer out of bed and ambulation.  

## 2021-05-12 ENCOUNTER — Ambulatory Visit: Payer: Medicaid Other | Admitting: Podiatry

## 2021-05-19 ENCOUNTER — Ambulatory Visit: Payer: Medicaid Other | Admitting: Podiatry

## 2021-05-19 ENCOUNTER — Encounter: Payer: Self-pay | Admitting: Podiatry

## 2021-05-19 ENCOUNTER — Other Ambulatory Visit: Payer: Self-pay

## 2021-05-19 DIAGNOSIS — M216X1 Other acquired deformities of right foot: Secondary | ICD-10-CM

## 2021-05-19 DIAGNOSIS — M21861 Other specified acquired deformities of right lower leg: Secondary | ICD-10-CM

## 2021-05-19 DIAGNOSIS — M722 Plantar fascial fibromatosis: Secondary | ICD-10-CM | POA: Diagnosis not present

## 2021-05-19 NOTE — Progress Notes (Signed)
Subjective:  Patient ID: Cathy Trevino, female    DOB: 1986/01/07,  MRN: [id number]  Chief Complaint  Patient presents with  . Plantar Fasciitis    "it was doing good after the injection then I took a fall down some steps.  Now the pain is back as bad as before"    36 y.o. female presents with the above complaint.  Patient presents for follow-up of Planter fasciitis to the right foot.  She states that he was doing really good before she fell down some steps.  Now the pain is back and is worse than before.  She states that she was try to be very careful and was doing really good.  She denies any other acute complaints.  She would like to discuss neck steps.  She seems like she may have rather aggravated the Planter fasciitis   Review of Systems: Negative except as noted in the HPI. Denies N/V/F/Ch.  Past Medical History:  Diagnosis Date  . Anemia   . Anxiety   . Asthma   . Depression   . Menorrhagia     Current Outpatient Medications:  .  acetaminophen (TYLENOL) 500 MG tablet, Take 500 mg by mouth every 6 (six) hours as needed., Disp: , Rfl:  .  cetirizine (ZYRTEC) 10 MG tablet, Cetirizine HCl 10 MG Oral Tablet QTY: 30 tablet Days: 30 Refills: 3  Written: 02/10/20 Patient Instructions: Take 1 tablet by mouth nightly at bedtime as needed for seasonal allergies, Disp: , Rfl:  .  EPINEPHrine 0.3 mg/0.3 mL IJ SOAJ injection, Inject 0.3 mLs into the muscle See admin instructions., Disp: , Rfl:  .  ergocalciferol (VITAMIN D2) 1.25 MG (50000 UT) capsule, Take 50,000 Units by mouth once a week., Disp: , Rfl:  .  fluticasone (FLONASE) 50 MCG/ACT nasal spray, Place into both nostrils daily., Disp: , Rfl:  .  levonorgestrel (MIRENA) 20 MCG/24HR IUD, 1 each by Intrauterine route once., Disp: , Rfl:  .  Multiple Vitamin (MULTIVITAMIN ADULT PO), Take 1 tablet by mouth daily at 8 pm., Disp: , Rfl:  .  NON FORMULARY, Allergy shots weekly, Disp: , Rfl:  .  VITAMIN B1-B12 IM, Inject into the muscle  every 30 (thirty) days., Disp: , Rfl:   Social History   Tobacco Use  Smoking Status Former Smoker  . Packs/day: 0.25  . Years: 5.00  . Pack years: 1.25  . Types: Cigarettes  . Quit date: 02/22/2016  . Years since quitting: 5.2  Smokeless Tobacco Never Used    Allergies  Allergen Reactions  . Penicillins Rash   Objective:  There were no vitals filed for this visit. There is no height or weight on file to calculate BMI. Constitutional Well developed. Well nourished.  Vascular Dorsalis pedis pulses palpable bilaterally. Posterior tibial pulses palpable bilaterally. Capillary refill normal to all digits.  No cyanosis or clubbing noted. Pedal hair growth normal.  Neurologic Normal speech. Oriented to person, place, and time. Epicritic sensation to light touch grossly present bilaterally.  Dermatologic Nails well groomed and normal in appearance. No open wounds. No skin lesions.  Orthopedic: Normal joint ROM without pain or crepitus bilaterally. No visible deformities. Tender to palpation at the calcaneal tuber right. No pain with calcaneal squeeze right. Ankle ROM diminished range of motion right. Silfverskiold Test: positive right.   Radiographs: Taken and reviewed. No acute fractures or dislocations. No evidence of stress fracture.  Plantar heel spur present. Posterior heel spur absent.   Assessment:   1.  Plantar fasciitis of right foot   2. Gastrocnemius equinus of right lower extremity    Plan:  Patient was evaluated and treated and all questions answered.  Plantar Fasciitis, right with underlying heel spur with underlying gastrocnemius equinus - XR reviewed as above.  - Educated on icing and stretching. Instructions given.  -Third injection delivered to the plantar fascia as below. - DME: None - Pharmacologic management: None -Over-the-counter orthotics were discussed with the patient in extensive detail  Procedure: Injection Tendon/Ligament Location: Right  plantar fascia at the glabrous junction; medial approach. Skin Prep: alcohol Injectate: 0.5 cc 0.5% marcaine plain, 0.5 cc of 1% Lidocaine, 0.5 cc kenalog 10. Disposition: Patient tolerated procedure well. Injection site dressed with a band-aid.  No follow-ups on file.

## 2021-05-24 ENCOUNTER — Telehealth: Payer: Self-pay

## 2021-05-24 NOTE — Telephone Encounter (Signed)
Pt called and would like to know what kind of brace the told to buy at her last appt. Please advise.

## 2021-05-25 NOTE — Telephone Encounter (Signed)
It would be plantar fascial brace

## 2021-05-26 NOTE — Telephone Encounter (Signed)
Pt.notified

## 2021-06-21 ENCOUNTER — Encounter: Payer: Self-pay | Admitting: Podiatry

## 2021-06-21 ENCOUNTER — Other Ambulatory Visit: Payer: Self-pay

## 2021-06-21 ENCOUNTER — Ambulatory Visit: Payer: Medicaid Other | Admitting: Podiatry

## 2021-06-21 DIAGNOSIS — M216X1 Other acquired deformities of right foot: Secondary | ICD-10-CM | POA: Diagnosis not present

## 2021-06-21 DIAGNOSIS — M722 Plantar fascial fibromatosis: Secondary | ICD-10-CM

## 2021-06-21 DIAGNOSIS — M21861 Other specified acquired deformities of right lower leg: Secondary | ICD-10-CM

## 2021-06-21 NOTE — Progress Notes (Signed)
Subjective:  Patient ID: Cathy Trevino, female    DOB: [birth year],  MRN: [id number]  Chief Complaint  Patient presents with   Plantar Fasciitis    PT stated that she is still having some pain she stated that she did hit her foot Saturday on a table .     35 y.o. female presents with the above complaint.  Patient presents with follow-up of right Planter fasciitis.  She states she is doing okay.  She still has some pain.  She had her foot again on Saturday.  She denies any other acute complaints.  She would like to discuss next treatment plans.  She states that she was Much better before she had small injury to the heel.   Review of Systems: Negative except as noted in the HPI. Denies N/V/F/Ch.  Past Medical History:  Diagnosis Date   Anemia    Anxiety    Asthma    Depression    Menorrhagia     Current Outpatient Medications:    acetaminophen (TYLENOL) 500 MG tablet, Take 500 mg by mouth every 6 (six) hours as needed., Disp: , Rfl:    cetirizine (ZYRTEC) 10 MG tablet, Cetirizine HCl 10 MG Oral Tablet QTY: 30 tablet Days: 30 Refills: 3  Written: 02/10/20 Patient Instructions: Take 1 tablet by mouth nightly at bedtime as needed for seasonal allergies, Disp: , Rfl:    EPINEPHrine 0.3 mg/0.3 mL IJ SOAJ injection, Inject 0.3 mLs into the muscle See admin instructions., Disp: , Rfl:    ergocalciferol (VITAMIN D2) 1.25 MG (50000 UT) capsule, Take 50,000 Units by mouth once a week., Disp: , Rfl:    fluticasone (FLONASE) 50 MCG/ACT nasal spray, Place into both nostrils daily., Disp: , Rfl:    levonorgestrel (MIRENA) 20 MCG/24HR IUD, 1 each by Intrauterine route once., Disp: , Rfl:    Multiple Vitamin (MULTIVITAMIN ADULT PO), Take 1 tablet by mouth daily at 8 pm., Disp: , Rfl:    NON FORMULARY, Allergy shots weekly, Disp: , Rfl:    VITAMIN B1-B12 IM, Inject into the muscle every 30 (thirty) days., Disp: , Rfl:   Social History   Tobacco Use  Smoking Status Former   Packs/day: 0.25    Years: 5.00   Pack years: 1.25   Types: Cigarettes   Quit date: 02/22/2016   Years since quitting: 5.3  Smokeless Tobacco Never    Allergies  Allergen Reactions   Penicillins Rash   Objective:  There were no vitals filed for this visit. There is no height or weight on file to calculate BMI. Constitutional Well developed. Well nourished.  Vascular Dorsalis pedis pulses palpable bilaterally. Posterior tibial pulses palpable bilaterally. Capillary refill normal to all digits.  No cyanosis or clubbing noted. Pedal hair growth normal.  Neurologic Normal speech. Oriented to person, place, and time. Epicritic sensation to light touch grossly present bilaterally.  Dermatologic Nails well groomed and normal in appearance. No open wounds. No skin lesions.  Orthopedic: Normal joint ROM without pain or crepitus bilaterally. No visible deformities. Tender to palpation at the calcaneal tuber right. No pain with calcaneal squeeze right. Ankle ROM diminished range of motion right. Silfverskiold Test: positive right.   Radiographs: Taken and reviewed. No acute fractures or dislocations. No evidence of stress fracture.  Plantar heel spur present. Posterior heel spur absent.   Assessment:   1. Plantar fasciitis of right foot   2. Gastrocnemius equinus of right lower extremity     Plan:  Patient was evaluated  and treated and all questions answered.  Plantar Fasciitis, right with underlying heel spur with underlying gastrocnemius equinus -Patient has failed multiple steroid injection at this time patient will benefit from cam boot immobilization.  Cam boot was dispensed have asked her to wear it for next 4 weeks.  I would like for her to aggressively mobilize if there is no resolve meant with a cam boot then patient will need a surgical intervention.  She states understanding.  No follow-ups on file.

## 2021-07-19 ENCOUNTER — Ambulatory Visit (INDEPENDENT_AMBULATORY_CARE_PROVIDER_SITE_OTHER): Payer: Medicaid Other

## 2021-07-19 ENCOUNTER — Other Ambulatory Visit: Payer: Self-pay

## 2021-07-19 ENCOUNTER — Ambulatory Visit: Payer: Medicaid Other | Admitting: Podiatry

## 2021-07-19 ENCOUNTER — Encounter: Payer: Self-pay | Admitting: Podiatry

## 2021-07-19 DIAGNOSIS — M7752 Other enthesopathy of left foot: Secondary | ICD-10-CM

## 2021-07-19 DIAGNOSIS — M779 Enthesopathy, unspecified: Secondary | ICD-10-CM

## 2021-07-19 DIAGNOSIS — M722 Plantar fascial fibromatosis: Secondary | ICD-10-CM | POA: Diagnosis not present

## 2021-07-19 DIAGNOSIS — M216X1 Other acquired deformities of right foot: Secondary | ICD-10-CM

## 2021-07-19 DIAGNOSIS — M21861 Other specified acquired deformities of right lower leg: Secondary | ICD-10-CM

## 2021-07-19 NOTE — Progress Notes (Signed)
Subjective:  Patient ID: Cathy Trevino, female    DOB: Apr 29, 1986,  MRN: [id number]  No chief complaint on file.   35 y.o. female presents with the above complaint.  P patient presents with continuous pain to the right plantar fasciitis.  She has been noncompliant with a cam boot.  I discussed the urgency of using a cam boot.  I also discussed with her that if the cam boot does not help we may have to discuss surgical options.  She states understanding.  She is also complaining of left ankle pain.  She states it has started hurting out of nowhere.  She wanted get evaluated make sure there is nothing going on.  This is likely compensatory from the boot.   Review of Systems: Negative except as noted in the HPI. Denies N/V/F/Ch.  Past Medical History:  Diagnosis Date   Anemia    Anxiety    Asthma    Depression    Menorrhagia     Current Outpatient Medications:    acetaminophen (TYLENOL) 500 MG tablet, Take 500 mg by mouth every 6 (six) hours as needed., Disp: , Rfl:    cetirizine (ZYRTEC) 10 MG tablet, Cetirizine HCl 10 MG Oral Tablet QTY: 30 tablet Days: 30 Refills: 3  Written: 02/10/20 Patient Instructions: Take 1 tablet by mouth nightly at bedtime as needed for seasonal allergies, Disp: , Rfl:    EPINEPHrine 0.3 mg/0.3 mL IJ SOAJ injection, Inject 0.3 mLs into the muscle See admin instructions., Disp: , Rfl:    ergocalciferol (VITAMIN D2) 1.25 MG (50000 UT) capsule, Take 50,000 Units by mouth once a week., Disp: , Rfl:    fluticasone (FLONASE) 50 MCG/ACT nasal spray, Place into both nostrils daily., Disp: , Rfl:    levonorgestrel (MIRENA) 20 MCG/24HR IUD, 1 each by Intrauterine route once., Disp: , Rfl:    Multiple Vitamin (MULTIVITAMIN ADULT PO), Take 1 tablet by mouth daily at 8 pm., Disp: , Rfl:    NON FORMULARY, Allergy shots weekly, Disp: , Rfl:    VITAMIN B1-B12 IM, Inject into the muscle every 30 (thirty) days., Disp: , Rfl:   Social History   Tobacco Use  Smoking Status  Former   Packs/day: 0.25   Years: 5.00   Pack years: 1.25   Types: Cigarettes   Quit date: 02/22/2016   Years since quitting: 5.4  Smokeless Tobacco Never    Allergies  Allergen Reactions   Penicillins Rash   Objective:  There were no vitals filed for this visit. There is no height or weight on file to calculate BMI. Constitutional Well developed. Well nourished.  Vascular Dorsalis pedis pulses palpable bilaterally. Posterior tibial pulses palpable bilaterally. Capillary refill normal to all digits.  No cyanosis or clubbing noted. Pedal hair growth normal.  Neurologic Normal speech. Oriented to person, place, and time. Epicritic sensation to light touch grossly present bilaterally.  Dermatologic Nails well groomed and normal in appearance. No open wounds. No skin lesions.  Orthopedic: Normal joint ROM without pain or crepitus bilaterally. No visible deformities. Tender to palpation at the calcaneal tuber right. No pain with calcaneal squeeze right. Ankle ROM diminished range of motion right. Silfverskiold Test: positive right.  Mild pain on palpation to the left ankle.  Mild pain with range of motion.  No pain along the posterior tibial tendon Achilles tendon ATFL ligament.   Radiographs: Taken and reviewed. No acute fractures or dislocations. No evidence of stress fracture.  Plantar heel spur present. Posterior heel spur absent.  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  No severe osteoarthritic change noted no other bony abnormalities identified.  Assessment:   1. Tendinitis   2. Plantar fasciitis of right foot   3. Gastrocnemius equinus of right lower extremity     Plan:  Patient was evaluated and treated and all questions answered.  Plantar Fasciitis, right with underlying heel spur with underlying gastrocnemius equinus -Patient has failed multiple  steroid injection at this time patient will benefit from cam boot immobilization.  Continue cam boot was dispensed have asked her to wear it for next 4 weeks.  I would like for her to aggressively mobilize if there is no resolve meant with a cam boot then patient will need a surgical intervention.  She states understanding.  Left ankle pain secondary to compensation from the boot -I explained the patient the etiology of ankle pain and various treatment options were discussed.  I discussed with her that this is compensation from the boot and putting excessive stress to the left ankle which is aggravating it.  I discussed with her that it should get better once we get transitioning out of the boot.  Patient states understanding  No follow-ups on file.

## 2021-08-25 ENCOUNTER — Other Ambulatory Visit: Payer: Self-pay

## 2021-08-25 ENCOUNTER — Ambulatory Visit: Payer: Medicaid Other | Admitting: Podiatry

## 2021-08-25 DIAGNOSIS — M722 Plantar fascial fibromatosis: Secondary | ICD-10-CM

## 2021-08-25 DIAGNOSIS — Z01818 Encounter for other preprocedural examination: Secondary | ICD-10-CM | POA: Diagnosis not present

## 2021-08-25 DIAGNOSIS — M216X1 Other acquired deformities of right foot: Secondary | ICD-10-CM | POA: Diagnosis not present

## 2021-08-25 DIAGNOSIS — M21861 Other specified acquired deformities of right lower leg: Secondary | ICD-10-CM

## 2021-08-31 ENCOUNTER — Telehealth: Payer: Self-pay | Admitting: Urology

## 2021-08-31 NOTE — Progress Notes (Signed)
Subjective:  Patient ID: Cathy Trevino, female    DOB: 1986/10/21,  MRN: [id number]  Chief Complaint  Patient presents with   Foot Pain    PT stated that she is still having some pain     35 y.o. female presents with the above complaint.  Patient presents with follow-up of continuous pain to the right plantar fashion.  The cam boot immobilization does help however she is not able to come out of the boot.  At this time her pain is still about the same.  She would like to discuss surgical options at this time given that she has failed all conservative treatment options   Review of Systems: Negative except as noted in the HPI. Denies N/V/F/Ch.  Past Medical History:  Diagnosis Date   Anemia    Anxiety    Asthma    Depression    Menorrhagia     Current Outpatient Medications:    acetaminophen (TYLENOL) 500 MG tablet, Take 500 mg by mouth every 6 (six) hours as needed., Disp: , Rfl:    cetirizine (ZYRTEC) 10 MG tablet, Cetirizine HCl 10 MG Oral Tablet QTY: 30 tablet Days: 30 Refills: 3  Written: 02/10/20 Patient Instructions: Take 1 tablet by mouth nightly at bedtime as needed for seasonal allergies, Disp: , Rfl:    EPINEPHrine 0.3 mg/0.3 mL IJ SOAJ injection, Inject 0.3 mLs into the muscle See admin instructions., Disp: , Rfl:    ergocalciferol (VITAMIN D2) 1.25 MG (50000 UT) capsule, Take 50,000 Units by mouth once a week., Disp: , Rfl:    fluticasone (FLONASE) 50 MCG/ACT nasal spray, Place into both nostrils daily., Disp: , Rfl:    levonorgestrel (MIRENA) 20 MCG/24HR IUD, 1 each by Intrauterine route once., Disp: , Rfl:    Multiple Vitamin (MULTIVITAMIN ADULT PO), Take 1 tablet by mouth daily at 8 pm., Disp: , Rfl:    NON FORMULARY, Allergy shots weekly, Disp: , Rfl:    VITAMIN B1-B12 IM, Inject into the muscle every 30 (thirty) days., Disp: , Rfl:   Social History   Tobacco Use  Smoking Status Former   Packs/day: 0.25   Years: 5.00   Pack years: 1.25   Types: Cigarettes    Quit date: 02/22/2016   Years since quitting: 5.5  Smokeless Tobacco Never    Allergies  Allergen Reactions   Penicillins Rash   Objective:  There were no vitals filed for this visit. There is no height or weight on file to calculate BMI. Constitutional Well developed. Well nourished.  Vascular Dorsalis pedis pulses palpable bilaterally. Posterior tibial pulses palpable bilaterally. Capillary refill normal to all digits.  No cyanosis or clubbing noted. Pedal hair growth normal.  Neurologic Normal speech. Oriented to person, place, and time. Epicritic sensation to light touch grossly present bilaterally.  Dermatologic Nails well groomed and normal in appearance. No open wounds. No skin lesions.  Orthopedic: Normal joint ROM without pain or crepitus bilaterally. No visible deformities. Tender to palpation at the calcaneal tuber right. No pain with calcaneal squeeze right. Ankle ROM diminished range of motion right. Silfverskiold Test: positive right.  Mild pain on palpation to the left ankle.  Mild pain with range of motion.  No pain along the posterior tibial tendon Achilles tendon ATFL ligament.   Radiographs: Taken and reviewed. No acute fractures or dislocations. No evidence of stress fracture.  Plantar heel spur present. Posterior heel spur absent.  3 views of skeletally mature adult left ankle: Mild osteoarthritic changes noted to  the medial gutter of the ankle joint with uneven joint space narrowing.  No severe osteoarthritic change noted no other bony abnormalities identified.  Assessment:   1. Plantar fasciitis of right foot   2. Gastrocnemius equinus of right lower extremity   3. Preoperative examination      Plan:  Patient was evaluated and treated and all questions answered.  Plantar Fasciitis, right with underlying heel spur with underlying gastrocnemius equinus -Patient has failed multiple steroid injection at this time patient will benefit from cam boot  immobilization.   -Given that there is no clinical improvement in the setting of multiple failed steroid injection and cam boot immobilization at this time I discussed surgical management with the patient in extensive detail.  Since she has failed all conservative treatment options she would like to proceed with surgical options at this time.  I believe she will benefit from right foot endoscopic plantar fasciotomy with right gastrocnemius recession.  I discussed this plan in extensive detail she states understanding would like to proceed with surgical intervention.  She will be weightbearing as tolerated in cam boot after the surgery.  I discussed my preoperative intraoperative postoperative plan in extensive detail with the patient. -Informed surgical risk consent was reviewed and read aloud to the patient.  I reviewed the films.  I have discussed my findings with the patient in great detail.  I have discussed all risks including but not limited to infection, stiffness, scarring, limp, disability, deformity, damage to blood vessels and nerves, numbness, poor healing, need for braces, arthritis, chronic pain, amputation, death.  All benefits and realistic expectations discussed in great detail.  I have made no promises as to the outcome.  I have provided realistic expectations.  I have offered the patient a 2nd opinion, which they have declined and assured me they preferred to proceed despite the risks   Left ankle pain secondary to compensation from the boot -I explained the patient the etiology of ankle pain and various treatment options were discussed.  I discussed with her that this is compensation from the boot and putting excessive stress to the left ankle which is aggravating it.  I discussed with her that it should get better once we get transitioning out of the boot.  Patient states understanding  No follow-ups on file.

## 2021-08-31 NOTE — Telephone Encounter (Addendum)
DOS - 12/05/21   EPF RIGHT --- [id number] GASTROCNEMIUS RECESS RIGHT --- [id number]  [facility] EFFECTIVE DATE - 06/17/21   RECEIVED FAX FROM [facility] STATING THAT CPT CODES 10315 AND 27687 HAVE BEEN APPROVED, AUTH # [id number], GOOD FROM 09/05/21 - 12/05/21.   SPOKE WITH JAMIE WITH [employer] AND SHE EXTEND THE AUTH FOR ME TILL 11/16/21. SHE STATED THAT IF PATIENT RESCHEDULES AGAIN WE WILL HAVE TO DO A WHOLE NEW AUTH BECAUSE IT CAN ONLY BE UPDATED ONCE . 10/14/21  REF # [id number]   SPOKE WITH KELLY L. WITH [employer] AND SHE EXTEND THE AUTH FOR ME, UPDATED DATE IS 09/05/21 - 12/05/21.   REF # [id number]

## 2021-09-06 ENCOUNTER — Other Ambulatory Visit: Payer: Self-pay | Admitting: Nurse Practitioner

## 2021-09-06 DIAGNOSIS — N644 Mastodynia: Secondary | ICD-10-CM

## 2021-09-09 ENCOUNTER — Other Ambulatory Visit: Payer: Self-pay | Admitting: Nurse Practitioner

## 2021-09-09 DIAGNOSIS — N644 Mastodynia: Secondary | ICD-10-CM

## 2021-09-12 ENCOUNTER — Encounter: Payer: Medicaid Other | Admitting: Podiatry

## 2021-09-14 ENCOUNTER — Other Ambulatory Visit: Payer: Medicaid Other

## 2021-09-14 ENCOUNTER — Other Ambulatory Visit: Payer: Self-pay

## 2021-09-14 ENCOUNTER — Ambulatory Visit (INDEPENDENT_AMBULATORY_CARE_PROVIDER_SITE_OTHER): Payer: Medicaid Other | Admitting: Dermatology

## 2021-09-14 DIAGNOSIS — L820 Inflamed seborrheic keratosis: Secondary | ICD-10-CM

## 2021-09-14 NOTE — Patient Instructions (Signed)

## 2021-09-14 NOTE — Progress Notes (Signed)
   New Patient Visit  Subjective  Cathy Trevino is a 35 y.o. female who presents for the following: Other (Spots of hands x 2 months - one on left comes and goes but one on right scabbed over).  The following portions of the chart were reviewed this encounter and updated as appropriate:   Tobacco  Allergies  Meds  Problems  Med Hx  Surg Hx  Fam Hx     Review of Systems:  No other skin or systemic complaints except as noted in HPI or Assessment and Plan.  Objective  Well appearing patient in no apparent distress; mood and affect are within normal limits.  A focused examination was performed including hands. Relevant physical exam findings are noted in the Assessment and Plan.  Right middle finger proximal dorsum x 1, left dorsum hand near little finger x 1 - total x 2 (2) Erythematous keratotic or waxy stuck-on papule or plaque.    Assessment & Plan  Inflamed seborrheic keratosis Right middle finger proximal dorsum x 1, left dorsum hand near little finger x 1 - total x 2  Inflamed SK vs Wart - RTC if not resolved after 4-6 weeks  Destruction of lesion - Right middle finger proximal dorsum x 1, left dorsum hand near little finger x 1 - total x 2 Complexity: simple   Destruction method: cryotherapy   Informed consent: discussed and consent obtained   Timeout:  patient name, date of birth, surgical site, and procedure verified Lesion destroyed using liquid nitrogen: Yes   Region frozen until ice ball extended beyond lesion: Yes   Outcome: patient tolerated procedure well with no complications   Post-procedure details: wound care instructions given    Return if symptoms worsen or fail to improve.  I, Joanie Coddington, CMA, am acting as scribe for Armida Sans, MD . Documentation: I have reviewed the above documentation for accuracy and completeness, and I agree with the above.  Armida Sans, MD

## 2021-09-15 ENCOUNTER — Encounter: Payer: Self-pay | Admitting: Dermatology

## 2021-09-16 ENCOUNTER — Other Ambulatory Visit: Payer: Medicaid Other

## 2021-09-20 ENCOUNTER — Other Ambulatory Visit: Payer: Self-pay

## 2021-09-20 ENCOUNTER — Ambulatory Visit
Admission: RE | Admit: 2021-09-20 | Discharge: 2021-09-20 | Disposition: A | Discharge status: Home / Self Care | Payer: Medicaid Other | Source: Ambulatory Visit | Attending: Nurse Practitioner | Admitting: Nurse Practitioner

## 2021-09-20 DIAGNOSIS — N644 Mastodynia: Secondary | ICD-10-CM

## 2021-09-27 ENCOUNTER — Encounter: Payer: Medicaid Other | Admitting: Podiatry

## 2021-09-30 ENCOUNTER — Encounter: Payer: Self-pay | Admitting: Oncology

## 2021-10-10 ENCOUNTER — Encounter: Payer: Medicaid Other | Admitting: Podiatry

## 2021-10-25 ENCOUNTER — Encounter: Payer: Medicaid Other | Admitting: Podiatry

## 2021-11-08 ENCOUNTER — Encounter: Payer: Medicaid Other | Admitting: Podiatry

## 2021-11-15 ENCOUNTER — Encounter: Payer: Medicaid Other | Admitting: Podiatry

## 2021-11-28 ENCOUNTER — Telehealth: Payer: Self-pay | Admitting: Podiatry

## 2021-11-28 NOTE — Telephone Encounter (Signed)
I am calling about my surgery that was rescheduled from last month. I received a call from a guy to confirm the time for 12/19 but I thought it was for this Friday, 12/16. I'm wanting to double check that y'all have it at that time. Please call me back. Thank you.

## 2021-11-29 ENCOUNTER — Encounter: Payer: Medicaid Other | Admitting: Podiatry

## 2021-12-05 ENCOUNTER — Other Ambulatory Visit: Payer: Self-pay | Admitting: Podiatry

## 2021-12-05 ENCOUNTER — Encounter: Payer: Self-pay | Admitting: Podiatry

## 2021-12-05 DIAGNOSIS — M722 Plantar fascial fibromatosis: Secondary | ICD-10-CM | POA: Diagnosis not present

## 2021-12-05 DIAGNOSIS — M216X1 Other acquired deformities of right foot: Secondary | ICD-10-CM | POA: Diagnosis not present

## 2021-12-05 MED ORDER — IBUPROFEN 800 MG PO TABS: 800.0000 mg | ORAL_TABLET | Freq: Four times a day (QID) | ORAL | 1 refills | Status: DC | PRN

## 2021-12-05 MED ORDER — OXYCODONE-ACETAMINOPHEN 5-325 MG PO TABS: 1.0000 | ORAL_TABLET | ORAL | 0 refills | Status: DC | PRN

## 2021-12-14 ENCOUNTER — Encounter: Payer: Medicaid Other | Admitting: Podiatry

## 2021-12-16 ENCOUNTER — Other Ambulatory Visit: Payer: Self-pay

## 2021-12-16 ENCOUNTER — Ambulatory Visit (INDEPENDENT_AMBULATORY_CARE_PROVIDER_SITE_OTHER): Payer: Medicaid Other | Admitting: Podiatry

## 2021-12-16 DIAGNOSIS — M21861 Other specified acquired deformities of right lower leg: Secondary | ICD-10-CM

## 2021-12-16 DIAGNOSIS — Z9889 Other specified postprocedural states: Secondary | ICD-10-CM

## 2021-12-16 DIAGNOSIS — M722 Plantar fascial fibromatosis: Secondary | ICD-10-CM

## 2021-12-21 NOTE — Progress Notes (Signed)
Subjective:  Patient ID: Cathy Trevino, female    DOB: 29-Mar-1986,  MRN: [id number]  Chief Complaint  Patient presents with   Routine Post Op    POV #1 DOS 12/05/2021 EPF RT, GASTROCNEMIUS RECESS RT    DOS: 12/05/2021 Procedure: Right EPF and gastrocnemius recession  36 y.o. female returns for post-op check.  Patient states she is doing well.  Pain is controlled.  Bandages clean dry and intact.  She is ambulating with a cam boot.  Review of Systems: Negative except as noted in the HPI. Denies N/V/F/Ch.  Past Medical History:  Diagnosis Date   Anemia    Anxiety    Asthma    Depression    Menorrhagia     Current Outpatient Medications:    acetaminophen (TYLENOL) 500 MG tablet, Take 500 mg by mouth every 6 (six) hours as needed., Disp: , Rfl:    cetirizine (ZYRTEC) 10 MG tablet, Cetirizine HCl 10 MG Oral Tablet QTY: 30 tablet Days: 30 Refills: 3  Written: 02/10/20 Patient Instructions: Take 1 tablet by mouth nightly at bedtime as needed for seasonal allergies, Disp: , Rfl:    EPINEPHrine 0.3 mg/0.3 mL IJ SOAJ injection, Inject 0.3 mLs into the muscle See admin instructions., Disp: , Rfl:    ergocalciferol (VITAMIN D2) 1.25 MG (50000 UT) capsule, Take 50,000 Units by mouth once a week., Disp: , Rfl:    fluticasone (FLONASE) 50 MCG/ACT nasal spray, Place into both nostrils daily., Disp: , Rfl:    ibuprofen (ADVIL) 800 MG tablet, Take 1 tablet (800 mg total) by mouth every 6 (six) hours as needed., Disp: 60 tablet, Rfl: 1   levonorgestrel (MIRENA) 20 MCG/24HR IUD, 1 each by Intrauterine route once., Disp: , Rfl:    Multiple Vitamin (MULTIVITAMIN ADULT PO), Take 1 tablet by mouth daily at 8 pm., Disp: , Rfl:    NON FORMULARY, Allergy shots weekly, Disp: , Rfl:    oxyCODONE-acetaminophen (PERCOCET) 5-325 MG tablet, Take 1 tablet by mouth every 4 (four) hours as needed for severe pain., Disp: 30 tablet, Rfl: 0   VITAMIN B1-B12 IM, Inject into the muscle every 30 (thirty) days., Disp: ,  Rfl:   Social History   Tobacco Use  Smoking Status Former   Packs/day: 0.25   Years: 5.00   Pack years: 1.25   Types: Cigarettes   Quit date: 02/22/2016   Years since quitting: 5.8  Smokeless Tobacco Never    Allergies  Allergen Reactions   Penicillins Rash   Objective:  There were no vitals filed for this visit. There is no height or weight on file to calculate BMI. Constitutional Well developed. Well nourished.  Vascular Foot warm and well perfused. Capillary refill normal to all digits.   Neurologic Normal speech. Oriented to person, place, and time. Epicritic sensation to light touch grossly present bilaterally.  Dermatologic Skin healing well without signs of infection. Skin edges well coapted without signs of infection.  Orthopedic: Tenderness to palpation noted about the surgical site.   Radiographs: None Assessment:   1. Plantar fasciitis of right foot   2. Gastrocnemius equinus of right lower extremity   3. Status post foot surgery    Plan:  Patient was evaluated and treated and all questions answered.  S/p foot surgery right -Progressing as expected post-operatively. -XR: See above -WB Status: Weightbearing as tolerated in cam boot -Sutures: Intact.  No clinical signs of dehiscence.  No complication noted. -Medications: None -Foot redressed.  No follow-ups on file.

## 2021-12-30 ENCOUNTER — Ambulatory Visit (INDEPENDENT_AMBULATORY_CARE_PROVIDER_SITE_OTHER): Payer: Medicaid Other | Admitting: Podiatry

## 2021-12-30 ENCOUNTER — Other Ambulatory Visit: Payer: Self-pay

## 2021-12-30 DIAGNOSIS — M722 Plantar fascial fibromatosis: Secondary | ICD-10-CM

## 2021-12-30 DIAGNOSIS — M21861 Other specified acquired deformities of right lower leg: Secondary | ICD-10-CM

## 2021-12-30 DIAGNOSIS — Z9889 Other specified postprocedural states: Secondary | ICD-10-CM

## 2022-01-04 NOTE — Progress Notes (Signed)
Subjective:  Patient ID: Cathy Trevino, female    DOB: Mar 06, 1986,  MRN: [id number]  Chief Complaint  Patient presents with   Routine Post Op    POV #2 DOS 12/05/2021 EPF RT, GASTROCNEMIUS RECESS RT    DOS: 12/05/2021 Procedure: Right EPF and gastrocnemius recession  36 y.o. female returns for post-op check.  Patient states she is doing well.  She does not have any pain.  She is ambulating in the cam boot.  She is ready go into regular shoes.  Review of Systems: Negative except as noted in the HPI. Denies N/V/F/Ch.  Past Medical History:  Diagnosis Date   Anemia    Anxiety    Asthma    Depression    Menorrhagia     Current Outpatient Medications:    acetaminophen (TYLENOL) 500 MG tablet, Take 500 mg by mouth every 6 (six) hours as needed., Disp: , Rfl:    cetirizine (ZYRTEC) 10 MG tablet, Cetirizine HCl 10 MG Oral Tablet QTY: 30 tablet Days: 30 Refills: 3  Written: 02/10/20 Patient Instructions: Take 1 tablet by mouth nightly at bedtime as needed for seasonal allergies, Disp: , Rfl:    EPINEPHrine 0.3 mg/0.3 mL IJ SOAJ injection, Inject 0.3 mLs into the muscle See admin instructions., Disp: , Rfl:    ergocalciferol (VITAMIN D2) 1.25 MG (50000 UT) capsule, Take 50,000 Units by mouth once a week., Disp: , Rfl:    fluticasone (FLONASE) 50 MCG/ACT nasal spray, Place into both nostrils daily., Disp: , Rfl:    ibuprofen (ADVIL) 800 MG tablet, Take 1 tablet (800 mg total) by mouth every 6 (six) hours as needed., Disp: 60 tablet, Rfl: 1   levonorgestrel (MIRENA) 20 MCG/24HR IUD, 1 each by Intrauterine route once., Disp: , Rfl:    Multiple Vitamin (MULTIVITAMIN ADULT PO), Take 1 tablet by mouth daily at 8 pm., Disp: , Rfl:    NON FORMULARY, Allergy shots weekly, Disp: , Rfl:    oxyCODONE-acetaminophen (PERCOCET) 5-325 MG tablet, Take 1 tablet by mouth every 4 (four) hours as needed for severe pain., Disp: 30 tablet, Rfl: 0   VITAMIN B1-B12 IM, Inject into the muscle every 30 (thirty)  days., Disp: , Rfl:   Social History   Tobacco Use  Smoking Status Former   Packs/day: 0.25   Years: 5.00   Pack years: 1.25   Types: Cigarettes   Quit date: 02/22/2016   Years since quitting: 5.8  Smokeless Tobacco Never    Allergies  Allergen Reactions   Penicillins Rash   Objective:  There were no vitals filed for this visit. There is no height or weight on file to calculate BMI. Constitutional Well developed. Well nourished.  Vascular Foot warm and well perfused. Capillary refill normal to all digits.   Neurologic Normal speech. Oriented to person, place, and time. Epicritic sensation to light touch grossly present bilaterally.  Dermatologic Skin completely epithelialized.  Good correction on noted.  10 degrees past neutral of dorsiflexion noted at the ankle joint.  No tight plantar fascia noted.  Orthopedic: Very mild tenderness to palpation noted about the surgical site.   Radiographs: None Assessment:   1. Plantar fasciitis of right foot   2. Gastrocnemius equinus of right lower extremity   3. Status post foot surgery     Plan:  Patient was evaluated and treated and all questions answered.  S/p foot surgery right -Progressing as expected post-operatively. -XR: See above -WB Status: Weightbearing as tolerated in regular shoes -Sutures: Removed no  clinical signs of dehiscence.  No complication noted. -Medications: None  No follow-ups on file.

## 2022-02-16 ENCOUNTER — Ambulatory Visit (INDEPENDENT_AMBULATORY_CARE_PROVIDER_SITE_OTHER): Payer: Medicaid Other | Admitting: Podiatry

## 2022-02-16 ENCOUNTER — Other Ambulatory Visit: Payer: Self-pay

## 2022-02-16 DIAGNOSIS — M21861 Other specified acquired deformities of right lower leg: Secondary | ICD-10-CM

## 2022-02-16 DIAGNOSIS — M722 Plantar fascial fibromatosis: Secondary | ICD-10-CM

## 2022-02-16 DIAGNOSIS — Z9889 Other specified postprocedural states: Secondary | ICD-10-CM

## 2022-02-16 NOTE — Progress Notes (Signed)
?Subjective:  ?Patient ID: Cathy Trevino, female    DOB: 05/31/1986,  MRN: [id number] ? ?Chief Complaint  ?Patient presents with  ? Routine Post Op  ?  DOS 12/05/2021 EPF RT, GASTROCNEMIUS RECESS RT  ? ? ?DOS: 12/05/2021 ?Procedure: Right EPF and gastrocnemius recession ? ?36 y.o. female returns for post-op check.  Patient states she is doing well.  She does not have any pain.  She is ambulating now in regular shoes.  No pain. ? ?Review of Systems: Negative except as noted in the HPI. Denies N/V/F/Ch. ? ?Past Medical History:  ?Diagnosis Date  ? Anemia   ? Anxiety   ? Asthma   ? Depression   ? Menorrhagia   ? ? ?Current Outpatient Medications:  ?  acetaminophen (TYLENOL) 500 MG tablet, Take 500 mg by mouth every 6 (six) hours as needed., Disp: , Rfl:  ?  cetirizine (ZYRTEC) 10 MG tablet, Cetirizine HCl 10 MG Oral Tablet QTY: 30 tablet Days: 30 Refills: 3  Written: 02/10/20 Patient Instructions: Take 1 tablet by mouth nightly at bedtime as needed for seasonal allergies, Disp: , Rfl:  ?  EPINEPHrine 0.3 mg/0.3 mL IJ SOAJ injection, Inject 0.3 mLs into the muscle See admin instructions., Disp: , Rfl:  ?  ergocalciferol (VITAMIN D2) 1.25 MG (50000 UT) capsule, Take 50,000 Units by mouth once a week., Disp: , Rfl:  ?  fluticasone (FLONASE) 50 MCG/ACT nasal spray, Place into both nostrils daily., Disp: , Rfl:  ?  ibuprofen (ADVIL) 800 MG tablet, Take 1 tablet (800 mg total) by mouth every 6 (six) hours as needed., Disp: 60 tablet, Rfl: 1 ?  levonorgestrel (MIRENA) 20 MCG/24HR IUD, 1 each by Intrauterine route once., Disp: , Rfl:  ?  Multiple Vitamin (MULTIVITAMIN ADULT PO), Take 1 tablet by mouth daily at 8 pm., Disp: , Rfl:  ?  NON FORMULARY, Allergy shots weekly, Disp: , Rfl:  ?  oxyCODONE-acetaminophen (PERCOCET) 5-325 MG tablet, Take 1 tablet by mouth every 4 (four) hours as needed for severe pain., Disp: 30 tablet, Rfl: 0 ?  VITAMIN B1-B12 IM, Inject into the muscle every 30 (thirty) days., Disp: , Rfl:  ? ?Social  History  ? ?Tobacco Use  ?Smoking Status Former  ? Packs/day: 0.25  ? Years: 5.00  ? Pack years: 1.25  ? Types: Cigarettes  ? Quit date: 02/22/2016  ? Years since quitting: 5.9  ?Smokeless Tobacco Never  ? ? ?Allergies  ?Allergen Reactions  ? Penicillins Rash  ? ?Objective:  ?There were no vitals filed for this visit. ?There is no height or weight on file to calculate BMI. ?Constitutional Well developed. ?Well nourished.  ?Vascular Foot warm and well perfused. ?Capillary refill normal to all digits.   ?Neurologic Normal speech. ?Oriented to person, place, and time. ?Epicritic sensation to light touch grossly present bilaterally.  ?Dermatologic Skin completely epithelialized.  Good correction on noted.  10 degrees past neutral of dorsiflexion noted at the ankle joint.  No tight plantar fascia noted.  ?Orthopedic: No tenderness to palpation noted about the surgical site.  ? ?Radiographs: None ?Assessment:  ? ?1. Plantar fasciitis of right foot   ?2. Gastrocnemius equinus of right lower extremity   ?3. Status post foot surgery   ? ? ? ?Plan:  ?Patient was evaluated and treated and all questions answered. ? ?S/p foot surgery right ?-Clinically healed and has been officially discharged from my care.  She states that she has resumed activity and is regularly without any pain. ? ?  No follow-ups on file.  ?

## 2022-03-26 IMAGING — CR DG LUMBAR SPINE COMPLETE W/ BEND
1 series · 7 of 7 positions shown · non-contrast
Comparison: None.

CLINICAL DATA: Low back pain with numbness and tingling in both
legs for 2 years. No known injury.

EXAM:
LUMBAR SPINE - COMPLETE WITH BENDING VIEWS

[Series 1: dg lumbar spine complete w/bend 6+v · 0.14mm/px · 7 of 7 slices shown]
[im 1/7]
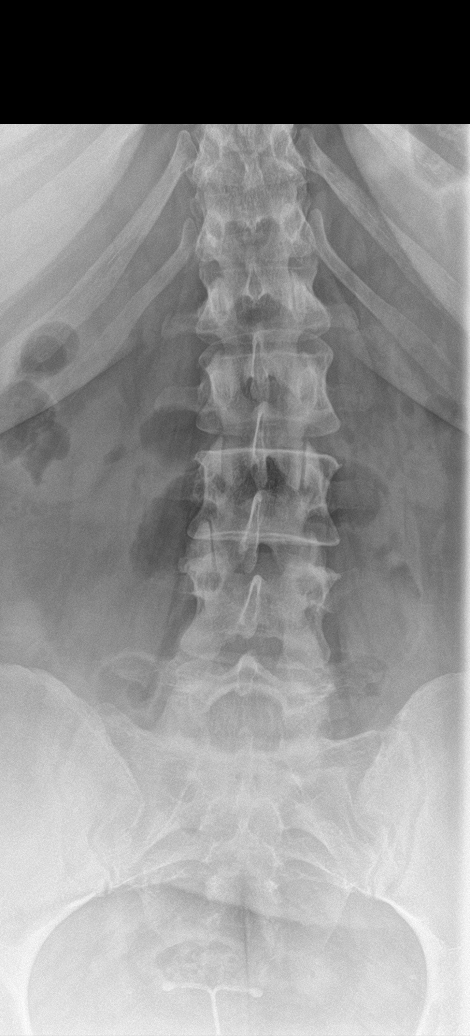
[im 2/7]
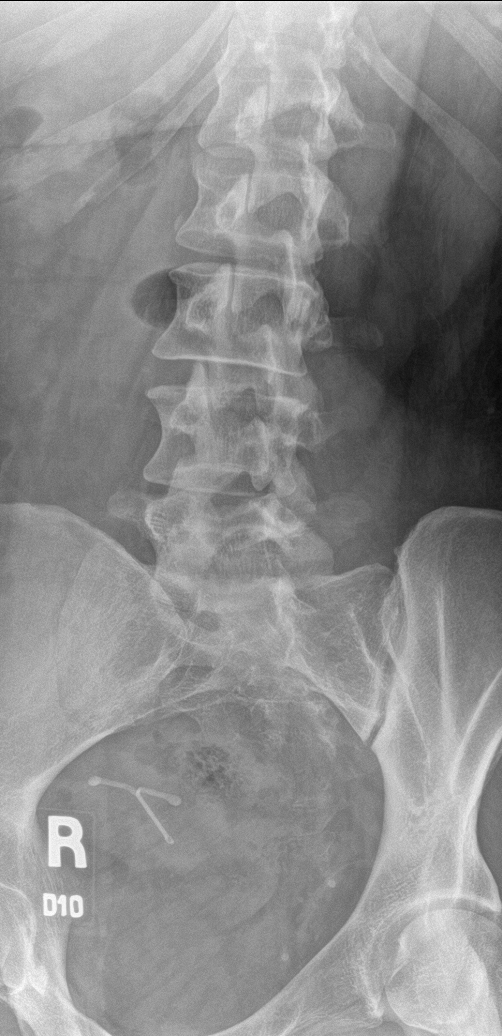
[im 3/7]
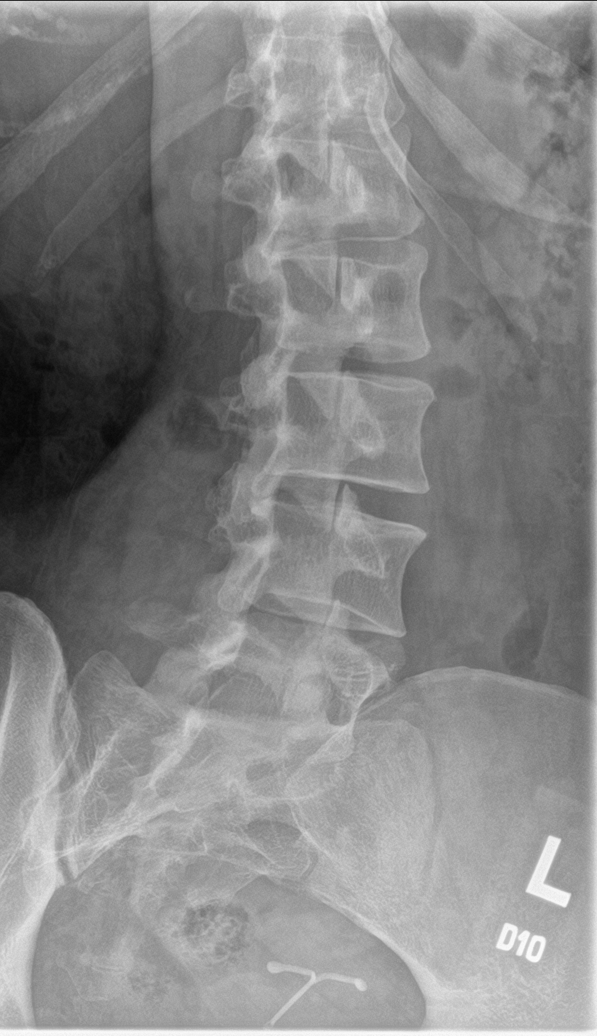
[im 4/7]
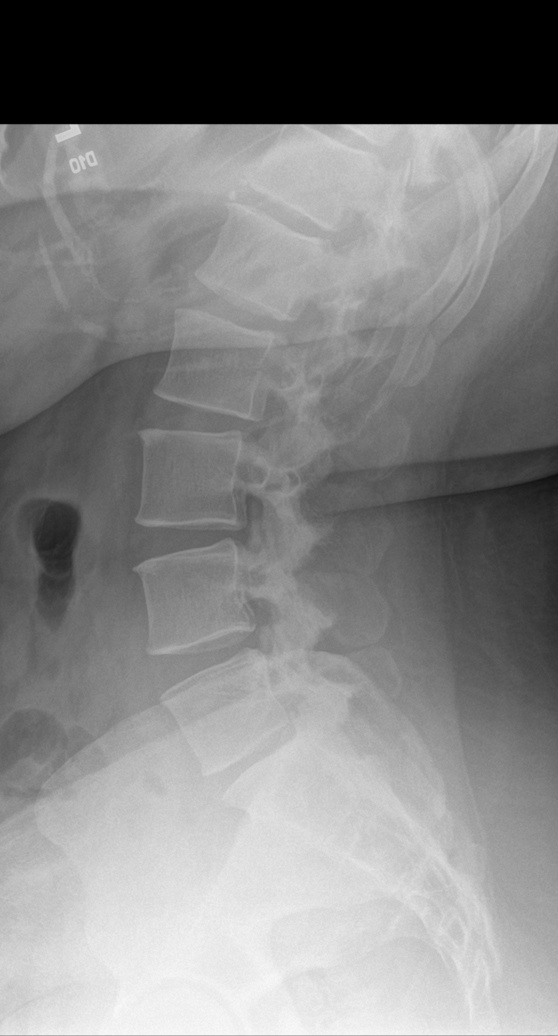
[im 5/7]
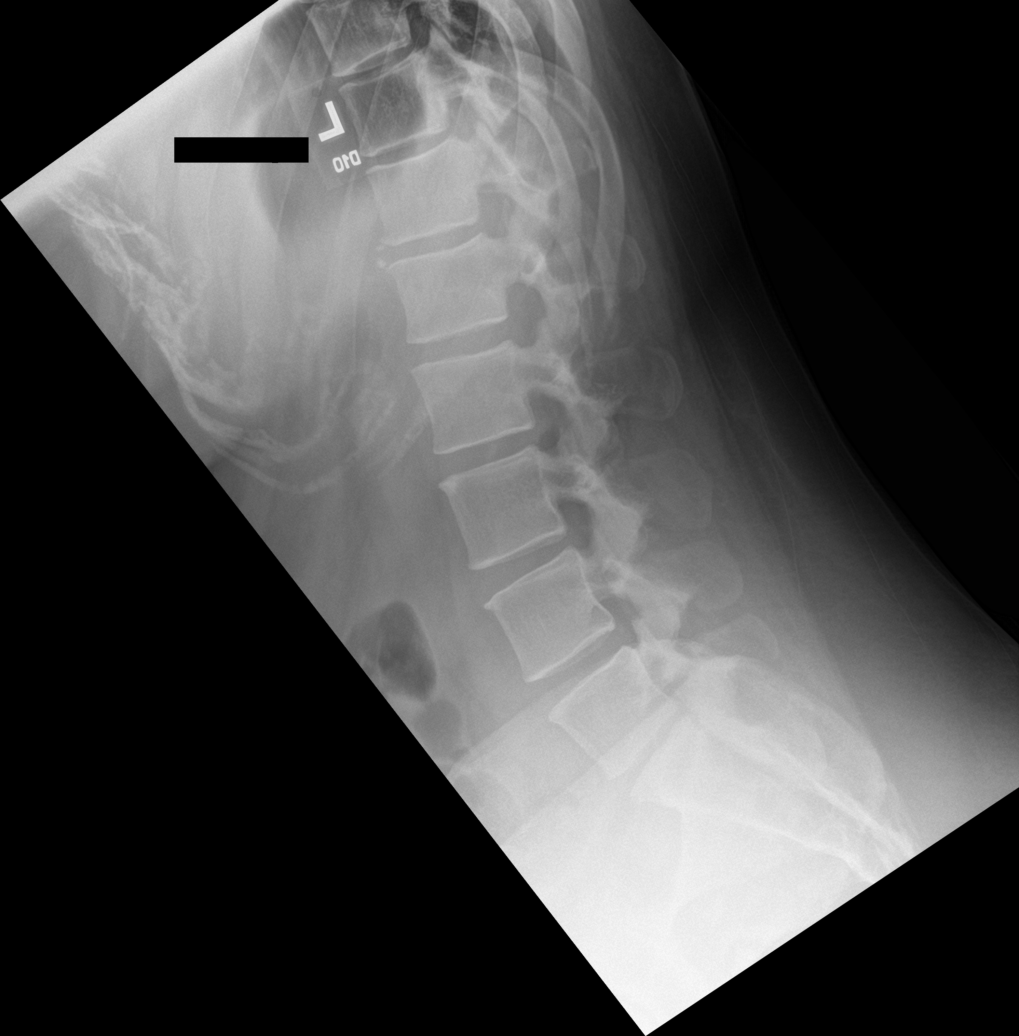
[im 6/7]
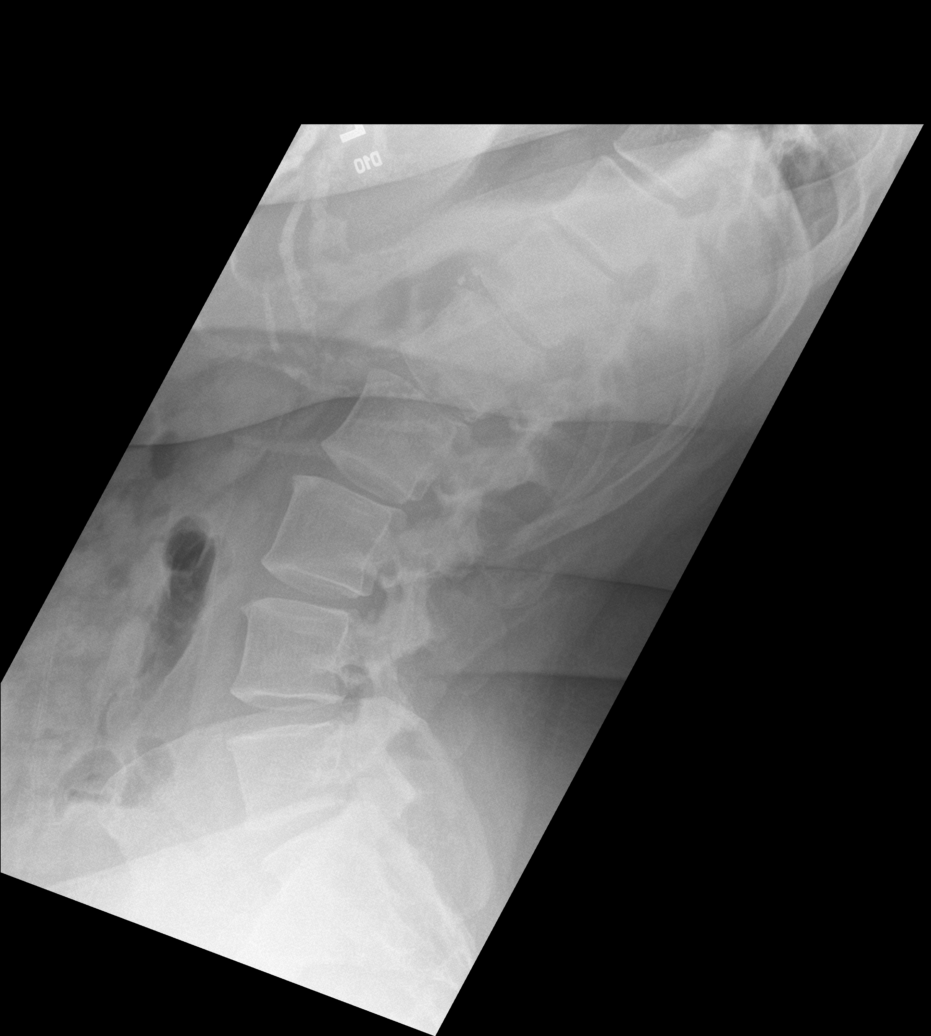
[im 7/7]
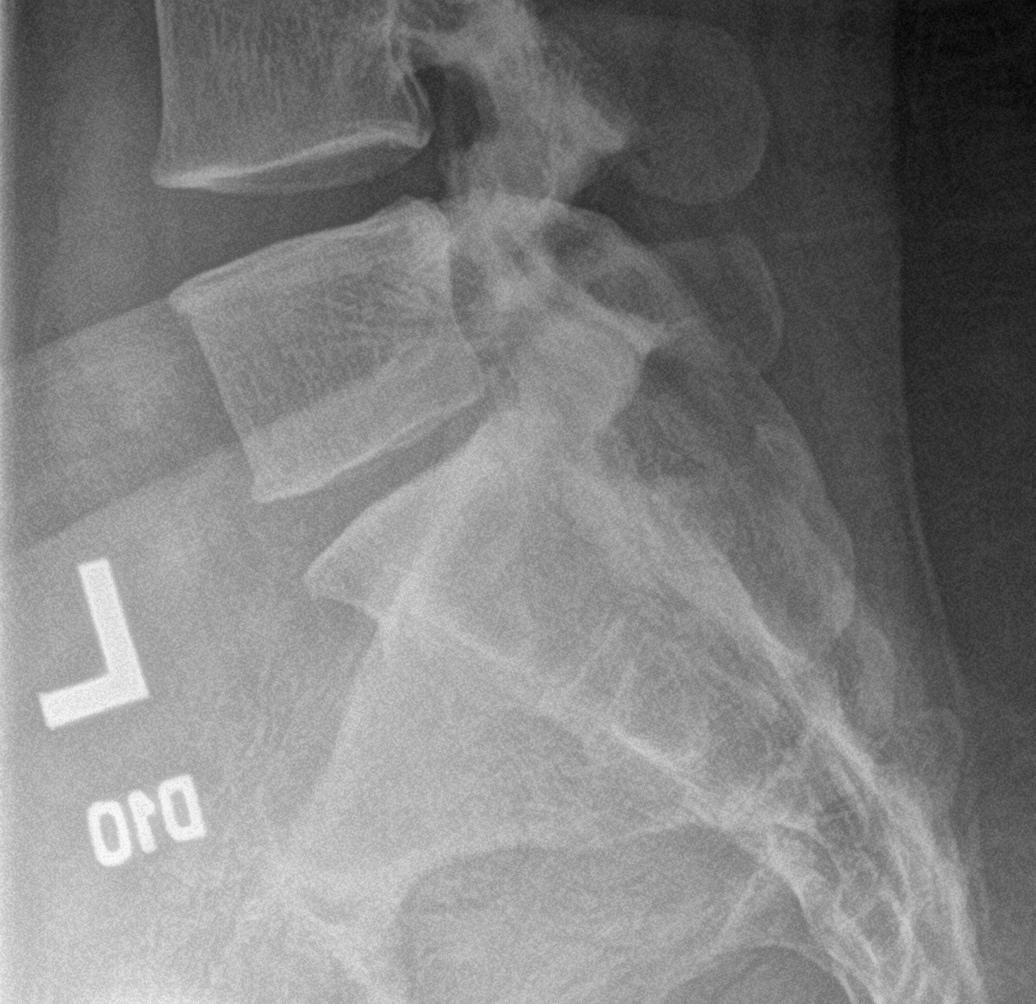

[7 of 7 positions shown; findings below may reference images not displayed]

FINDINGS: There is no evidence of lumbar spine fracture. Alignment is normal.
Intervertebral disc spaces are maintained. Mild facet degenerative
change L4-5 and L5-S1 noted. IUD is also noted.
IMPRESSION: Mild appearing facet degenerative disease lower lumbar spine.
Otherwise negative.

## 2022-04-18 ENCOUNTER — Other Ambulatory Visit: Payer: Self-pay | Admitting: Podiatry

## 2022-04-18 NOTE — Telephone Encounter (Signed)
Please advise 

## 2022-08-30 ENCOUNTER — Encounter: Payer: Self-pay | Admitting: Oncology

## 2022-08-31 ENCOUNTER — Ambulatory Visit: Payer: Medicaid Other | Admitting: Podiatry

## 2022-08-31 DIAGNOSIS — R252 Cramp and spasm: Secondary | ICD-10-CM | POA: Diagnosis not present

## 2022-08-31 DIAGNOSIS — M792 Neuralgia and neuritis, unspecified: Secondary | ICD-10-CM | POA: Diagnosis not present

## 2022-08-31 MED ORDER — CYCLOBENZAPRINE HCL 10 MG PO TABS: 10.0000 mg | ORAL_TABLET | Freq: Three times a day (TID) | ORAL | 0 refills | Status: DC | PRN

## 2022-09-06 ENCOUNTER — Ambulatory Visit (INDEPENDENT_AMBULATORY_CARE_PROVIDER_SITE_OTHER): Payer: Medicaid Other | Admitting: Psychiatry

## 2022-09-06 ENCOUNTER — Encounter: Payer: Self-pay | Admitting: Psychiatry

## 2022-09-06 ENCOUNTER — Other Ambulatory Visit
Admission: RE | Admit: 2022-09-06 | Discharge: 2022-09-06 | Disposition: A | Discharge status: Home / Self Care | Payer: Medicaid Other | Attending: Psychiatry | Admitting: Psychiatry

## 2022-09-06 VITALS — BP 112/79 | HR 93 | Temp 98.7°F | Wt 233.8 lb

## 2022-09-06 DIAGNOSIS — Z634 Disappearance and death of family member: Secondary | ICD-10-CM | POA: Insufficient documentation

## 2022-09-06 DIAGNOSIS — F419 Anxiety disorder, unspecified: Secondary | ICD-10-CM

## 2022-09-06 DIAGNOSIS — F331 Major depressive disorder, recurrent, moderate: Secondary | ICD-10-CM

## 2022-09-06 DIAGNOSIS — F321 Major depressive disorder, single episode, moderate: Secondary | ICD-10-CM | POA: Insufficient documentation

## 2022-09-06 HISTORY — DX: Disappearance and death of family member: Z63.4

## 2022-09-06 LAB — TSH: TSH: 1.426 u[IU]/mL (ref 0.350–4.500)

## 2022-09-06 MED ORDER — BUPROPION HCL 75 MG PO TABS: 75.0000 mg | ORAL_TABLET | Freq: Every morning | ORAL | 1 refills | Status: DC

## 2022-09-06 NOTE — Patient Instructions (Addendum)
[email or web address]  [email or web address]   [facility] [phone]   [place] [phone]  [facility] [phone]    Bupropion Tablets (Depression/Mood Disorders) What is this medication? BUPROPION (byoo PROE pee on) treats depression. It increases norepinephrine and dopamine in the brain, hormones that help regulate mood. It belongs to a group of medications called NDRIs. This medicine may be used for other purposes; ask your health care provider or pharmacist if you have questions. COMMON BRAND NAME(S): Wellbutrin What should I tell my care team before I take this medication? They need to know if you have any of these conditions: An eating disorder, such as anorexia or bulimia Bipolar disorder or psychosis Diabetes or high blood sugar, treated with medication Glaucoma Heart disease, previous heart attack, or irregular heart beat Head injury or brain tumor High blood pressure Kidney or liver disease Seizures Suicidal thoughts or a previous suicide attempt Tourette's syndrome Weight loss An unusual or allergic reaction to bupropion, other medications, foods, dyes, or preservatives Pregnant or trying to become pregnant Breast-feeding How should I use this medication? Take this medication by mouth with a glass of water. Follow the directions on the prescription label. You can take it with or without food. If it upsets your stomach, take it with food. Take your medication at regular intervals. Do not take your medication more often than directed. Do not stop taking this medication suddenly except upon the advice of your care team. Stopping this medication too quickly may cause serious side effects or your condition may worsen. A special MedGuide will be given to you by the pharmacist with each prescription and refill. Be sure to read this information carefully each time. Talk to your care team regarding the use of this medication in  children. Special care may be needed. Overdosage: If you think you have taken too much of this medicine contact a poison control center or emergency room at once. NOTE: This medicine is only for you. Do not share this medicine with others. What if I miss a dose? If you miss a dose, take it as soon as you can. If it is less than four hours to your next dose, take only that dose and skip the missed dose. Do not take double or extra doses. What may interact with this medication? Do not take this medication with any of the following: Linezolid MAOIs like Azilect, Carbex, Eldepryl, Marplan, Nardil, and Parnate Methylene blue (injected into a vein) Other medications that contain bupropion like Zyban This medication may also interact with the following: Alcohol Certain medications for anxiety or sleep Certain medications for blood pressure like metoprolol, propranolol Certain medications for depression or psychotic disturbances Certain medications for HIV or AIDS like efavirenz, lopinavir, nelfinavir, ritonavir Certain medications for irregular heart beat like propafenone, flecainide Certain medications for Parkinson's disease like amantadine, levodopa Certain medications for seizures like carbamazepine, phenytoin, phenobarbital Cimetidine Clopidogrel Cyclophosphamide Digoxin Furazolidone Isoniazid Nicotine Orphenadrine Procarbazine Steroid medications like prednisone or cortisone Stimulant medications for attention disorders, weight loss, or to stay awake Tamoxifen Theophylline Thiotepa Ticlopidine Tramadol Warfarin This list may not describe all possible interactions. Give your health care provider a list of all the medicines, herbs, non-prescription drugs, or dietary supplements you use. Also tell them if you smoke, drink alcohol, or use illegal drugs. Some items may interact with your medicine. What should I watch for while using this medication? Tell your care team if your  symptoms do  not get better or if they get worse. Visit your care team for regular checks on your progress. Because it may take several weeks to see the full effects of this medication, it is important to continue your treatment as prescribed. Watch for new or worsening thoughts of suicide or depression. This includes sudden changes in mood, behavior, or thoughts. These changes can happen at any time but are more common in the beginning of treatment or after a change in dose. Call your care team right away if you experience these thoughts or worsening depression. Manic episodes may happen in patients with bipolar disorder who take this medication. Watch for changes in feelings or behaviors such as feeling anxious, nervous, agitated, panicky, irritable, hostile, aggressive, impulsive, severely restless, overly excited and hyperactive, or trouble sleeping. These symptoms can happen at anytime but are more common in the beginning of treatment or after a change in dose. Call your care team right away if you notice any of these symptoms. This medication may cause serious skin reactions. They can happen weeks to months after starting the medication. Contact your care team right away if you notice fevers or flu-like symptoms with a rash. The rash may be red or purple and then turn into blisters or peeling of the skin. Or, you might notice a red rash with swelling of the face, lips or lymph nodes in your neck or under your arms. Avoid drinks that contain alcohol while taking this medication. Drinking large amounts of alcohol, using sleeping or anxiety medications, or quickly stopping the use of these agents while taking this medication may increase your risk for a seizure. Do not drive or use heavy machinery until you know how this medication affects you. This medication can impair your ability to perform these tasks. Do not take this medication close to bedtime. It may prevent you from sleeping. Your mouth may get  dry. Chewing sugarless gum or sucking hard candy, and drinking plenty of water may help. Contact your care team if the problem does not go away or is severe. What side effects may I notice from receiving this medication? Side effects that you should report to your care team as soon as possible: Allergic reactions--skin rash, itching, hives, swelling of the face, lips, tongue, or throat Increase in blood pressure Mood and behavior changes--anxiety, nervousness, confusion, hallucinations, irritability, hostility, thoughts of suicide or self-harm, worsening mood, feelings of depression Redness, blistering, peeling, or loosening of the skin, including inside the mouth Seizures Sudden eye pain or change in vision such as blurry vision, seeing halos around lights, vision loss Side effects that usually do not require medical attention (report to your care team if they continue or are bothersome): Constipation Dizziness Dry mouth Loss of appetite Nausea Tremors or shaking Trouble sleeping This list may not describe all possible side effects. Call your doctor for medical advice about side effects. You may report side effects to FDA at 1-800-FDA-1088. Where should I keep my medication? Keep out of the reach of children and pets. Store at room temperature between 20 and 25 degrees C (68 and 77 degrees F), away from direct sunlight and moisture. Keep tightly closed. Throw away any unused medication after the expiration date. NOTE: This sheet is a summary. It may not cover all possible information. If you have questions about this medicine, talk to your doctor, pharmacist, or health care provider.  2023 Elsevier/Gold Standard (2008-01-25 00:00:00)
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
## 2022-09-07 ENCOUNTER — Encounter: Payer: Self-pay | Admitting: Psychiatry

## 2022-09-12 NOTE — Progress Notes (Signed)
Subjective:  Patient ID: Cathy Trevino, female    DOB: [birth year],  MRN: [id number]  Chief Complaint  Patient presents with   Numbness    36 y.o. female presents with the above complaint.  Patient presents with new complaint of neuropathic pain to the left toes.  Patient states that he is tingling sensation in her toes been on for 3 weeks.  She is also experiencing left muscle cramps as well.  She is not a candidate for muscle cramp she has not seen anyone else prior to seeing me denies any other acute complaints or Planter fasciitis is doing okay is manageable.  Pain scale is 5 out of 10 on the left foot.  Hurts with ambulation worse with pressure   Review of Systems: Negative except as noted in the HPI. Denies N/V/F/Ch.  Past Medical History:  Diagnosis Date   Anemia    Anxiety    Asthma    Depression    Menorrhagia     Current Outpatient Medications:    cyclobenzaprine (FLEXERIL) 10 MG tablet, Take 1 tablet (10 mg total) by mouth 3 (three) times daily as needed for muscle spasms., Disp: 30 tablet, Rfl: 0   acetaminophen (TYLENOL) 500 MG tablet, Take 500 mg by mouth every 6 (six) hours as needed., Disp: , Rfl:    buPROPion (WELLBUTRIN) 75 MG tablet, Take 1 tablet (75 mg total) by mouth in the morning., Disp: 30 tablet, Rfl: 1   cetirizine (ZYRTEC) 10 MG tablet, Cetirizine HCl 10 MG Oral Tablet QTY: 30 tablet Days: 30 Refills: 3  Written: 02/10/20 Patient Instructions: Take 1 tablet by mouth nightly at bedtime as needed for seasonal allergies, Disp: , Rfl:    citalopram (CELEXA) 10 MG tablet, Take 10 mg by mouth daily., Disp: , Rfl:    EPINEPHrine 0.3 mg/0.3 mL IJ SOAJ injection, Inject 0.3 mLs into the muscle See admin instructions., Disp: , Rfl:    ergocalciferol (VITAMIN D2) 1.25 MG (50000 UT) capsule, Take 50,000 Units by mouth once a week., Disp: , Rfl:    fluticasone (FLONASE) 50 MCG/ACT nasal spray, Place into both nostrils daily., Disp: , Rfl:    hydrOXYzine (VISTARIL) 25  MG capsule, Take 25 mg by mouth 2 (two) times daily as needed., Disp: , Rfl:    ibuprofen (ADVIL) 800 MG tablet, TAKE 1 TABLET BY MOUTH EVERY 6 HOURS AS NEEDED, Disp: 60 tablet, Rfl: 1   levonorgestrel (MIRENA) 20 MCG/24HR IUD, 1 each by Intrauterine route once., Disp: , Rfl:    Multiple Vitamin (MULTIVITAMIN ADULT PO), Take 1 tablet by mouth daily at 8 pm., Disp: , Rfl:    rosuvastatin (CRESTOR) 20 MG tablet, Take 20 mg by mouth at bedtime., Disp: , Rfl:    VITAMIN B1-B12 IM, Inject into the muscle every 30 (thirty) days., Disp: , Rfl:   Social History   Tobacco Use  Smoking Status Former   Packs/day: 0.25   Years: 5.00   Total pack years: 1.25   Types: Cigarettes   Quit date: 02/22/2016   Years since quitting: 6.5  Smokeless Tobacco Never    Allergies  Allergen Reactions   Penicillins Rash   Objective:  There were no vitals filed for this visit. There is no height or weight on file to calculate BMI. Constitutional Well developed. Well nourished.  Vascular Dorsalis pedis pulses palpable bilaterally. Posterior tibial pulses palpable bilaterally. Capillary refill normal to all digits.  No cyanosis or clubbing noted. Pedal hair growth normal.  Neurologic Normal  speech. Oriented to person, place, and time. Epicritic sensation to light touch grossly present bilaterally.  Dermatologic Neuropathic pain noted to left toes 1 through 5.  Negative tarsal tunnel.  Negative common peroneal nerve compression.  Orthopedic: 70 complaint no muscle cramping noted without any active signs.   Radiographs: None Assessment:   1. Muscle cramp   2. Neuropathic pain    Plan:  Patient was evaluated and treated and all questions answered.  Left muscle cramp -I explained to the patient the etiology of cramping and various treatment options were discussed.  I believe this may likely be positional or could be nerve like in etiology.  Patient does have lower back pain which could likely be  attributing to neuropathic pain into her toes as well as muscle cramping especially at nighttime.  She does not have any claudication or vascular like issues. -Flexeril was dispensed No follow-ups on file.

## 2022-10-02 ENCOUNTER — Ambulatory Visit (INDEPENDENT_AMBULATORY_CARE_PROVIDER_SITE_OTHER): Payer: Medicaid Other | Admitting: Psychiatry

## 2022-10-02 ENCOUNTER — Encounter: Payer: Self-pay | Admitting: Psychiatry

## 2022-10-02 VITALS — BP 101/64 | HR 111 | Temp 98.9°F | Ht 64.0 in | Wt 235.8 lb

## 2022-10-02 DIAGNOSIS — F331 Major depressive disorder, recurrent, moderate: Secondary | ICD-10-CM

## 2022-10-02 DIAGNOSIS — Z634 Disappearance and death of family member: Secondary | ICD-10-CM

## 2022-10-02 DIAGNOSIS — F411 Generalized anxiety disorder: Secondary | ICD-10-CM | POA: Diagnosis not present

## 2022-10-02 MED ORDER — HYDROXYZINE PAMOATE 25 MG PO CAPS: 25.0000 mg | ORAL_CAPSULE | Freq: Two times a day (BID) | ORAL | 1 refills | Status: DC | PRN
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
## 2022-10-02 NOTE — Patient Instructions (Addendum)
[facility] [phone].  Therapy - [place] - [phone]  [email or web address]- [email or web address]

## 2022-10-16 ENCOUNTER — Encounter (INDEPENDENT_AMBULATORY_CARE_PROVIDER_SITE_OTHER): Payer: Self-pay

## 2022-10-31 ENCOUNTER — Ambulatory Visit: Payer: Medicaid Other | Admitting: Psychiatry

## 2022-12-06 ENCOUNTER — Ambulatory Visit: Payer: Medicaid Other | Admitting: Psychiatry

## 2023-01-19 ENCOUNTER — Ambulatory Visit: Payer: Medicaid Other | Admitting: Psychiatry

## 2023-01-28 ENCOUNTER — Encounter: Payer: Self-pay | Admitting: Oncology

## 2023-01-30 ENCOUNTER — Telehealth: Payer: Self-pay

## 2023-01-30 NOTE — Telephone Encounter (Signed)
Patient Cathy Trevino stating that she was having Chills, body aches, runny nose and sinus issues. Tried to call pt back on the two number listed and no VM picked up

## 2023-04-03 ENCOUNTER — Encounter: Payer: Self-pay | Admitting: Obstetrics

## 2023-04-03 ENCOUNTER — Ambulatory Visit (INDEPENDENT_AMBULATORY_CARE_PROVIDER_SITE_OTHER): Payer: Medicaid Other | Admitting: Obstetrics

## 2023-04-03 VITALS — BP 104/71 | HR 105 | Ht 64.0 in | Wt 254.0 lb

## 2023-04-03 DIAGNOSIS — Z30433 Encounter for removal and reinsertion of intrauterine contraceptive device: Secondary | ICD-10-CM | POA: Diagnosis not present

## 2023-04-03 DIAGNOSIS — Z3202 Encounter for pregnancy test, result negative: Secondary | ICD-10-CM

## 2023-04-03 DIAGNOSIS — Z30430 Encounter for insertion of intrauterine contraceptive device: Secondary | ICD-10-CM

## 2023-04-03 LAB — POCT URINE PREGNANCY: Preg Test, Ur: NEGATIVE

## 2023-04-03 MED ORDER — LEVONORGESTREL 20 MCG/DAY IU IUD
1.0000 | INTRAUTERINE_SYSTEM | Freq: Once | INTRAUTERINE | Status: AC
  Administered 2023-04-03: 1 via INTRAUTERINE

## 2023-04-03 NOTE — Progress Notes (Signed)
ENCOUNTER FOR IUD REMOVAL AND INSERTION   Subjective  Cathy Trevino is a 37 y.o. [phone] who presents today for IUD removal and insertion. She has been happy with her current IUD. She desires long-term, reversible hormonal control of heavy periods. We have thoroughly reviewed the risks, benefits, and alternatives, and she has elected to proceed with Mirena insertion.   Objective BP 104/71   Pulse (!) 105   Ht  (1.626 m)   Wt 254 lb (115.2 kg)   LMP  (LMP Unknown)   BMI 43.60 kg/m   UPT: negative  Pelvic: Pelvic exam: VULVA: normal appearing vulva with no masses, tenderness or lesions, VAGINA: normal appearing vagina with normal color and discharge, no lesions, CERVIX: normal appearing cervix, cervical polyp noted. UTERUS: uterus is normal size, shape, consistency and nontender, anteverted.   Procedure Note Consent was obtained prior to the procedure. A bimanual exam was performed to determine the position of the uterus. A sterile speculum was placed in the vagina, and the cervix was visualized. The IUD strings were grasped with ring forceps and removed with ease. An endocervical polyp was noted. Betadine was applied to the cervix. An Cathy Trevino was placed on the anterior lip of the cervix, and gentle traction was applied to straighten and stabilize it. Due to difficulty passing the sound through the cervix, Dr. Logan Bores was called to assist. The uterus was sounded to about 7 cm. A dilator was then placed. The IUD was inserted to the appropriate depth and the insertion tool was removed. The strings were trimmed to about 3 cm. Bleeding was minimal. Cathy Trevino tolerated the procedure well. Post-procedure care and warning signs were reviewed with Cathy Trevino.   Follow up in 4-6 weeks for string check or PRN.  Cathy Trevino, CNM

## 2023-04-05 ENCOUNTER — Encounter: Payer: Self-pay | Admitting: Obstetrics

## 2023-04-06 ENCOUNTER — Telehealth: Payer: Self-pay

## 2023-04-06 NOTE — Telephone Encounter (Signed)
Patient called stating that she had her IUD removed and they noticed polyps she wanted to know should she come see you or what is her next step

## 2023-04-12 ENCOUNTER — Ambulatory Visit: Payer: Medicaid Other | Admitting: Nurse Practitioner

## 2023-04-12 ENCOUNTER — Encounter: Payer: Self-pay | Admitting: Nurse Practitioner

## 2023-04-12 VITALS — BP 114/76 | HR 83 | Ht 64.0 in | Wt 254.4 lb

## 2023-04-12 DIAGNOSIS — J301 Allergic rhinitis due to pollen: Secondary | ICD-10-CM

## 2023-04-12 DIAGNOSIS — M5137 Other intervertebral disc degeneration, lumbosacral region: Secondary | ICD-10-CM

## 2023-04-12 DIAGNOSIS — M5126 Other intervertebral disc displacement, lumbar region: Secondary | ICD-10-CM | POA: Diagnosis not present

## 2023-04-12 DIAGNOSIS — F411 Generalized anxiety disorder: Secondary | ICD-10-CM

## 2023-04-12 DIAGNOSIS — E782 Mixed hyperlipidemia: Secondary | ICD-10-CM | POA: Diagnosis not present

## 2023-04-12 MED ORDER — CLINDAMYCIN HCL 150 MG PO CAPS: 150.0000 mg | ORAL_CAPSULE | Freq: Three times a day (TID) | ORAL | 0 refills | Status: DC

## 2023-04-12 MED ORDER — IBUPROFEN 800 MG PO TABS: 800.0000 mg | ORAL_TABLET | Freq: Three times a day (TID) | ORAL | 1 refills | Status: DC | PRN

## 2023-04-12 MED ORDER — HYDROXYZINE PAMOATE 25 MG PO CAPS: 25.0000 mg | ORAL_CAPSULE | Freq: Two times a day (BID) | ORAL | 1 refills | Status: DC | PRN

## 2023-04-12 NOTE — Progress Notes (Signed)
Established Patient Office Visit  Subjective:  Patient ID: Cathy Trevino, female    DOB: 26-Mar-1986  Age: 37 y.o. MRN: [id number]  Chief Complaint  Patient presents with   Follow-up    Follow up    Follow up appt to IUD placement with [facility] who stated patient has endocervical polyp.  Pt was not told if she needs to have a follow up for this.  She is here today to see if she needs a follow up.    Pt also has left lower gum edema and will be seeing an oral surgeon in the future but insurance does not cover so will start clinda and IBU for now.    No other concerns at this time.   Past Medical History:  Diagnosis Date   Anemia    Anxiety    Asthma    Depression    Menorrhagia     Past Surgical History:  Procedure Laterality Date   CESAREAN SECTION  2012   TUBAL LIGATION      Social History   Socioeconomic History   Marital status: Divorced    Spouse name: Not on file   Number of children: 3   Years of education: Not on file   Highest education level: Some college, no degree  Occupational History   Not on file  Tobacco Use   Smoking status: Former    Packs/day: 0.25    Years: 5.00    Additional pack years: 0.00    Total pack years: 1.25    Types: Cigarettes    Quit date: 02/22/2016    Years since quitting: 7.1   Smokeless tobacco: Never  Vaping Use   Vaping Use: Never used  Substance and Sexual Activity   Alcohol use: No   Drug use: No   Sexual activity: Yes    Birth control/protection: I.U.D.  Other Topics Concern   Not on file  Social History Narrative   Not on file   Social Determinants of Health   Financial Resource Strain: Not on file  Food Insecurity: Not on file  Transportation Needs: Not on file  Physical Activity: Sufficiently Active (11/04/2018)   Exercise Vital Sign    Days of Exercise per Week: 7 days    Minutes of Exercise per Session: 40 min  Stress: Not on file  Social Connections: Not on file  Intimate Partner Violence:  Not on file    Family History  Problem Relation Age of Onset   Heart failure Mother    Hypertension Mother    Alcohol abuse Father    Depression Father    Alcohol abuse Brother    Schizophrenia Brother    Autism spectrum disorder Son    Autism spectrum disorder Son    ADD / ADHD Daughter     Allergies  Allergen Reactions   Penicillins Rash    Review of Systems  Constitutional: Negative.   HENT: Negative.    Eyes: Negative.   Respiratory: Negative.    Cardiovascular: Negative.   Gastrointestinal: Negative.   Genitourinary: Negative.   Musculoskeletal:  Positive for back pain.  Skin: Negative.   Neurological: Negative.   Endo/Heme/Allergies: Negative.   Psychiatric/Behavioral:  The patient is nervous/anxious.        Objective:   LMP  (LMP Unknown)   There were no vitals filed for this visit.  Physical Exam Vitals reviewed.  Constitutional:      Appearance: Normal appearance.  HENT:     Head: Normocephalic.  Nose: Nose normal.     Mouth/Throat:     Mouth: Mucous membranes are moist.  Eyes:     Pupils: Pupils are equal, round, and reactive to light.  Cardiovascular:     Rate and Rhythm: Normal rate and regular rhythm.  Pulmonary:     Effort: Pulmonary effort is normal.     Breath sounds: Normal breath sounds.  Abdominal:     General: Bowel sounds are normal.     Palpations: Abdomen is soft.  Musculoskeletal:        General: Normal range of motion.     Cervical back: Normal range of motion and neck supple.  Skin:    General: Skin is warm and dry.  Neurological:     Mental Status: She is alert and oriented to person, place, and time.  Psychiatric:        Mood and Affect: Mood normal.        Behavior: Behavior normal.      No results found for any visits on 04/12/23.  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   Total time spent: 35 minutes  Orson Eva, NP  04/12/2023

## 2023-05-02 ENCOUNTER — Encounter: Payer: Self-pay | Admitting: Obstetrics and Gynecology

## 2023-05-02 ENCOUNTER — Ambulatory Visit (INDEPENDENT_AMBULATORY_CARE_PROVIDER_SITE_OTHER): Payer: Medicaid Other | Admitting: Obstetrics and Gynecology

## 2023-05-02 VITALS — BP 111/76 | HR 77 | Ht 64.0 in | Wt 257.3 lb

## 2023-05-02 DIAGNOSIS — Z30431 Encounter for routine checking of intrauterine contraceptive device: Secondary | ICD-10-CM

## 2023-05-02 NOTE — Progress Notes (Signed)
Patient presents today for IUD string check. She states no longer bleeding . Patient reports no pain, discomfort or trouble with intercourse. No other questions or concerns.   

## 2023-05-02 NOTE — Progress Notes (Signed)
HPI:      [name] is a 37 y.o. [phone] who LMP was No LMP recorded (lmp unknown). (Menstrual status: IUD).  Subjective:   She presents today for IUD follow-up.  She reports no bleeding and seems to be doing well with her IUD.  She has had intercourse and there have been no issues.  She denies any postcoital bleeding. (A small endocervical polyp was noted at her IUD insertion)    Hx: The following portions of the patient's history were reviewed and updated as appropriate:             She  has a past medical history of Anemia, Anxiety, Asthma, Depression, and Menorrhagia. She does not have any pertinent problems on file. She  has a past surgical history that includes Cesarean section (2012) and Tubal ligation. Her family history includes ADD / ADHD in her daughter; Alcohol abuse in her brother and father; Autism spectrum disorder in her son and son; Depression in her father; Heart failure in her mother; Hypertension in her mother; Schizophrenia in her brother. She  reports that she quit smoking about 7 years ago. Her smoking use included cigarettes. She has a 1.25 pack-year smoking history. She has never used smokeless tobacco. She reports that she does not drink alcohol and does not use drugs. She has a current medication list which includes the following prescription(s): acetaminophen, cetirizine, citalopram, clindamycin, cyclobenzaprine, epinephrine, ergocalciferol, fluticasone, hydroxyzine, ibuprofen, ibuprofen, multiple vitamin, rosuvastatin, and vitamin b1-b12. She is allergic to penicillins.       Review of Systems:  Review of Systems  Constitutional: Denied constitutional symptoms, night sweats, recent illness, fatigue, fever, insomnia and weight loss.  Eyes: Denied eye symptoms, eye pain, photophobia, vision change and visual disturbance.  Ears/Nose/Throat/Neck: Denied ear, nose, throat or neck symptoms, hearing loss, nasal discharge, sinus congestion and sore throat.   Cardiovascular: Denied cardiovascular symptoms, arrhythmia, chest pain/pressure, edema, exercise intolerance, orthopnea and palpitations.  Respiratory: Denied pulmonary symptoms, asthma, pleuritic pain, productive sputum, cough, dyspnea and wheezing.  Gastrointestinal: Denied, gastro-esophageal reflux, melena, nausea and vomiting.  Genitourinary: Denied genitourinary symptoms including symptomatic vaginal discharge, pelvic relaxation issues, and urinary complaints.  Musculoskeletal: Denied musculoskeletal symptoms, stiffness, swelling, muscle weakness and myalgia.  Dermatologic: Denied dermatology symptoms, rash and scar.  Neurologic: Denied neurology symptoms, dizziness, headache, neck pain and syncope.  Psychiatric: Denied psychiatric symptoms, anxiety and depression.  Endocrine: Denied endocrine symptoms including hot flashes and night sweats.   Meds:   Current Outpatient Medications on File Prior to Visit  Medication Sig Dispense Refill   acetaminophen (TYLENOL) 500 MG tablet Take 500 mg by mouth every 6 (six) hours as needed.     cetirizine (ZYRTEC) 10 MG tablet Cetirizine HCl 10 MG Oral Tablet QTY: 30 tablet Days: 30 Refills: 3  Written: 02/10/20 Patient Instructions: Take 1 tablet by mouth nightly at bedtime as needed for seasonal allergies     citalopram (CELEXA) 10 MG tablet Take 10 mg by mouth daily.     clindamycin (CLEOCIN) 150 MG capsule Take 1 capsule (150 mg total) by mouth 3 (three) times daily. 30 capsule 0   cyclobenzaprine (FLEXERIL) 10 MG tablet Take 1 tablet (10 mg total) by mouth 3 (three) times daily as needed for muscle spasms. 30 tablet 0   EPINEPHrine 0.3 mg/0.3 mL IJ SOAJ injection Inject 0.3 mLs into the muscle See admin instructions.     ergocalciferol (VITAMIN D2) 1.25 MG (50000 UT) capsule Take 50,000 Units by mouth once  a week.     fluticasone (FLONASE) 50 MCG/ACT nasal spray Place into both nostrils daily.     hydrOXYzine (VISTARIL) 25 MG capsule Take 1  capsule (25 mg total) by mouth 2 (two) times daily as needed for anxiety. 60 capsule 1   ibuprofen (ADVIL) 800 MG tablet TAKE 1 TABLET BY MOUTH EVERY 6 HOURS AS NEEDED 60 tablet 1   ibuprofen (ADVIL) 800 MG tablet Take 1 tablet (800 mg total) by mouth every 8 (eight) hours as needed. 90 tablet 1   Multiple Vitamin (MULTIVITAMIN ADULT PO) Take 1 tablet by mouth daily at 8 pm.     rosuvastatin (CRESTOR) 20 MG tablet Take 20 mg by mouth at bedtime.     VITAMIN B1-B12 IM Inject into the muscle every 30 (thirty) days.     No current facility-administered medications on file prior to visit.      Objective:     Vitals:   05/02/23 1416  BP: 111/76  Pulse: 77   Filed Weights   05/02/23 1416  Weight: 257 lb 4.8 oz (116.7 kg)              Physical examination   Pelvic:   Vulva: Normal appearance.  No lesions.  Vagina: No lesions or abnormalities noted.  Support: Normal pelvic support.  Urethra No masses tenderness or scarring.  Meatus Normal size without lesions or prolapse.  Cervix: Normal appearance.  No lesions. IUD strings noted at cervical os.  Anus: Normal exam.  No lesions.  Perineum: Normal exam.  No lesions.        Bimanual   Uterus: Normal size.  Non-tender.  Mobile.  AV.  Adnexae: No masses.  Non-tender to palpation.  Cul-de-sac: Negative for abnormality.             Assessment:    G3P3003 Patient Active Problem List   Diagnosis Date Noted   MDD (major depressive disorder), recurrent episode, moderate (HCC) 10/02/2022   Current moderate episode of major depressive disorder without prior episode (HCC) 09/06/2022   Bereavement 09/06/2022   Anxiety disorder 09/06/2022   Osteoarthritis of facet joint of lumbar spine 04/13/2021   Spondylosis without myelopathy or radiculopathy, lumbosacral region 04/13/2021   Abnormal MRI, lumbar spine (07/15/2018) 03/09/2021   Chronic low back pain (1ry area of Pain) (Bilateral) (R>L) w/o sciatica 03/09/2021   Lumbosacral facet  syndrome (Bilateral) 03/09/2021   Chronic sacroiliac joint pain (Bilateral) (R>L) 03/09/2021   DDD (degenerative disc disease), lumbosacral 03/09/2021   Morbid obesity with BMI of 40.0-44.9, adult (HCC) 03/09/2021   Levoscoliosis of lumbar spine 03/09/2021   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) 03/09/2021   Allergic rhinitis due to animal (cat) (dog) hair and dander 01/30/2021   Allergic rhinitis due to pollen 01/30/2021   Rash and other nonspecific skin eruption 01/30/2021   Chronic pain syndrome 01/30/2021   Pharmacologic therapy 01/30/2021   Disorder of skeletal system 01/30/2021   Problems influencing health status 01/30/2021   Varicose veins of leg with pain, bilateral 08/05/2019   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) 10/17/2018   Hyperlipidemia 10/17/2018   Other B-complex deficiencies 05/23/2018   Vitamin D deficiency 05/23/2018   Menorrhagia with regular cycle 03/11/2018   Microcytic anemia 02/25/2018   Iron deficiency anemia due to chronic blood loss 02/18/2018   Chest pain 12/21/2015   Obesity 11/25/2014   Allergic rhinitis 05/21/2014   Dermatitis 10/23/2013   Other headache syndrome 06/11/2013  1. Encounter for routine checking of intrauterine contraceptive device (IUD)     Patient doing well with IUD-no issues  Endocervical polyp not noted during this visit.   Plan:            1.  Return for annual examination.  Patient to inform us if she has any postcoital bleeding at which time we can recheck for endocervical polyp. Orders No orders of the defined types were placed in this encounter.   No orders of the defined types were placed in this encounter.     F/U  Return for Annual Physical. I spent 22 minutes involved in the care of this patient preparing to see the patient by obtaining and reviewing her medical history (including labs, imaging tests and prior procedures), documenting clinical  information in the electronic health record (EHR), counseling and coordinating care plans, writing and sending prescriptions, ordering tests or procedures and in direct communicating with the patient and medical staff discussing pertinent items from her history and physical exam.  Elonda Husky, M.D. 05/02/2023 3:00 PM

## 2023-06-20 ENCOUNTER — Other Ambulatory Visit: Payer: Self-pay | Admitting: Nurse Practitioner

## 2023-06-20 ENCOUNTER — Other Ambulatory Visit: Payer: Medicaid Other

## 2023-06-21 LAB — COMPREHENSIVE METABOLIC PANEL
ALT: 11 IU/L (ref 0–32)
AST: 13 IU/L (ref 0–40)
Albumin: 3.6 g/dL — ABNORMAL LOW (ref 3.9–4.9)
Alkaline Phosphatase: 46 IU/L (ref 44–121)
BUN/Creatinine Ratio: 14 (ref 9–23)
BUN: 9 mg/dL (ref 6–20)
Bilirubin Total: 0.5 mg/dL (ref 0.0–1.2)
CO2: 24 mmol/L (ref 20–29)
Calcium: 8.5 mg/dL — ABNORMAL LOW (ref 8.7–10.2)
Chloride: 104 mmol/L (ref 96–106)
Creatinine, Ser: 0.65 mg/dL (ref 0.57–1.00)
Globulin, Total: 1.9 g/dL (ref 1.5–4.5)
Glucose: 99 mg/dL (ref 70–99)
Potassium: 4.2 mmol/L (ref 3.5–5.2)
Sodium: 140 mmol/L (ref 134–144)
Total Protein: 5.5 g/dL — ABNORMAL LOW (ref 6.0–8.5)
eGFR: 115 mL/min/{1.73_m2} (ref >59)

## 2023-06-21 LAB — LIPID PANEL W/O CHOL/HDL RATIO
Cholesterol, Total: 116 mg/dL (ref 100–199)
HDL: 33 mg/dL — ABNORMAL LOW (ref >39)
LDL Chol Calc (NIH): 58 mg/dL (ref 0–99)
Triglycerides: 145 mg/dL (ref 0–149)
VLDL Cholesterol Cal: 25 mg/dL (ref 5–40)

## 2023-06-21 LAB — TSH: TSH: 1.53 u[IU]/mL (ref 0.450–4.500)

## 2023-06-21 LAB — CBC WITH DIFFERENTIAL/PLATELET
Basophils Absolute: 0.1 10*3/uL (ref 0.0–0.2)
Basos: 1 %
EOS (ABSOLUTE): 0.2 10*3/uL (ref 0.0–0.4)
Eos: 3 %
Hematocrit: 43.3 % (ref 34.0–46.6)
Hemoglobin: 14.1 g/dL (ref 11.1–15.9)
Immature Grans (Abs): 0 10*3/uL (ref 0.0–0.1)
Immature Granulocytes: 0 %
Lymphocytes Absolute: 1.7 10*3/uL (ref 0.7–3.1)
Lymphs: 20 %
MCH: 28.8 pg (ref 26.6–33.0)
MCHC: 32.6 g/dL (ref 31.5–35.7)
MCV: 89 fL (ref 79–97)
Monocytes Absolute: 0.5 10*3/uL (ref 0.1–0.9)
Monocytes: 6 %
Neutrophils Absolute: 5.9 10*3/uL (ref 1.4–7.0)
Neutrophils: 70 %
Platelets: 192 10*3/uL (ref 150–450)
RBC: 4.89 x10E6/uL (ref 3.77–5.28)
RDW: 12.8 % (ref 11.7–15.4)
WBC: 8.4 10*3/uL (ref 3.4–10.8)

## 2023-06-21 LAB — HGB A1C W/O EAG: Hgb A1c MFr Bld: 5.6 % (ref 4.8–5.6)

## 2023-06-21 LAB — VITAMIN B12: Vitamin B-12: 687 pg/mL (ref 232–1245)

## 2023-06-25 ENCOUNTER — Encounter: Payer: Self-pay | Admitting: Cardiology

## 2023-06-25 ENCOUNTER — Ambulatory Visit: Payer: Medicaid Other | Admitting: Cardiology

## 2023-06-25 VITALS — BP 110/80 | HR 76 | Ht 64.0 in | Wt 257.4 lb

## 2023-06-25 DIAGNOSIS — Z131 Encounter for screening for diabetes mellitus: Secondary | ICD-10-CM

## 2023-06-25 DIAGNOSIS — E539 Vitamin B deficiency, unspecified: Secondary | ICD-10-CM | POA: Diagnosis not present

## 2023-06-25 DIAGNOSIS — E782 Mixed hyperlipidemia: Secondary | ICD-10-CM | POA: Diagnosis not present

## 2023-06-25 DIAGNOSIS — F411 Generalized anxiety disorder: Secondary | ICD-10-CM

## 2023-06-25 DIAGNOSIS — D500 Iron deficiency anemia secondary to blood loss (chronic): Secondary | ICD-10-CM

## 2023-06-25 MED ORDER — CITALOPRAM HYDROBROMIDE 10 MG PO TABS: 10.0000 mg | ORAL_TABLET | Freq: Every day | ORAL | 6 refills | Status: DC

## 2023-06-25 NOTE — Progress Notes (Unsigned)
Established Patient Office Visit  Subjective:  Patient ID: Cathy Trevino, female    DOB: 1986/12/02  Age: 37 y.o. MRN: [id number]  Chief Complaint  Patient presents with   Follow-up    6 month follow up, discuss lab results.    Patient in office for 4 month follow up, discuss lab results. Patient has no complaints today. Wearing and benefiting from her CPAP. Patient had her fasting labs done. Protein continues to be low. Patient admits to not increasing protein as instructed at previous visit. Discussed various sources of protein such as protein drinks, carnation instant breakfast shakes or protein bars. Patent understands to check sugar content of protein bars before consuming. Continue to diet and exercise. All other blood work within normal limits. Patient due for a pap smear this year, will schedule at next visit. Mammogram 09/2021 for breast pain, was normal. Due for next mammogram at age 51.     No other concerns at this time.   Past Medical History:  Diagnosis Date   Anemia    Anxiety    Asthma    Depression    Menorrhagia     Past Surgical History:  Procedure Laterality Date   CESAREAN SECTION  2012   TUBAL LIGATION      Social History   Socioeconomic History   Marital status: Divorced    Spouse name: Not on file   Number of children: 3   Years of education: Not on file   Highest education level: Some college, no degree  Occupational History   Not on file  Tobacco Use   Smoking status: Former    Packs/day: 0.25    Years: 5.00    Additional pack years: 0.00    Total pack years: 1.25    Types: Cigarettes    Quit date: 02/22/2016    Years since quitting: 7.3   Smokeless tobacco: Never  Vaping Use   Vaping Use: Never used  Substance and Sexual Activity   Alcohol use: No   Drug use: No   Sexual activity: Yes    Birth control/protection: I.U.D.  Other Topics Concern   Not on file  Social History Narrative   Not on file   Social Determinants of  Health   Financial Resource Strain: Not on file  Food Insecurity: Not on file  Transportation Needs: Not on file  Physical Activity: Sufficiently Active (11/04/2018)   Exercise Vital Sign    Days of Exercise per Week: 7 days    Minutes of Exercise per Session: 40 min  Stress: Not on file  Social Connections: Not on file  Intimate Partner Violence: Not on file    Family History  Problem Relation Age of Onset   Heart failure Mother    Hypertension Mother    Alcohol abuse Father    Depression Father    Alcohol abuse Brother    Schizophrenia Brother    Autism spectrum disorder Son    Autism spectrum disorder Son    ADD / ADHD Daughter     Allergies  Allergen Reactions   Penicillins Rash    Review of Systems  Constitutional: Negative.   HENT: Negative.    Eyes: Negative.   Respiratory: Negative.  Negative for cough and shortness of breath.   Cardiovascular: Negative.  Negative for chest pain, palpitations and leg swelling.  Gastrointestinal: Negative.  Negative for abdominal pain, constipation, diarrhea, heartburn, nausea and vomiting.  Genitourinary: Negative.  Negative for dysuria and flank pain.  Musculoskeletal:  Negative.  Negative for joint pain and myalgias.  Skin: Negative.   Neurological: Negative.  Negative for dizziness and headaches.  Endo/Heme/Allergies: Negative.   Psychiatric/Behavioral: Negative.  Negative for depression and suicidal ideas. The patient is not nervous/anxious.        Objective:   BP 110/80   Pulse 76   Ht 5\' 4"  (1.626 m)   Wt 257 lb 6.4 oz (116.8 kg)   SpO2 98%   BMI 44.18 kg/m   Vitals:   06/25/23 0931  BP: 110/80  Pulse: 76  Height: 5\' 4"  (1.626 m)  Weight: 257 lb 6.4 oz (116.8 kg)  SpO2: 98%  BMI (Calculated): 44.16    Physical Exam Vitals and nursing note reviewed.  Constitutional:      Appearance: Normal appearance.  HENT:     Head: Normocephalic and atraumatic.     Nose: Nose normal.     Mouth/Throat:      Mouth: Mucous membranes are moist.     Pharynx: Oropharynx is clear.  Eyes:     Conjunctiva/sclera: Conjunctivae normal.     Pupils: Pupils are equal, round, and reactive to light.  Cardiovascular:     Rate and Rhythm: Normal rate and regular rhythm.     Pulses: Normal pulses.     Heart sounds: Normal heart sounds. No murmur heard. Pulmonary:     Effort: Pulmonary effort is normal.     Breath sounds: Normal breath sounds. No wheezing.  Abdominal:     General: Bowel sounds are normal.     Palpations: Abdomen is soft.     Tenderness: There is no abdominal tenderness. There is no right CVA tenderness or left CVA tenderness.  Musculoskeletal:        General: Normal range of motion.     Cervical back: Normal range of motion.     Right lower leg: No edema.     Left lower leg: No edema.  Skin:    General: Skin is warm and dry.  Neurological:     General: No focal deficit present.     Mental Status: She is alert and oriented to person, place, and time.  Psychiatric:        Mood and Affect: Mood normal.        Behavior: Behavior normal.      No results found for any visits on 06/25/23.  Recent Results (from the past 2160 hour(s))  POCT urine pregnancy     Status: Normal   Collection Time: 04/03/23  3:46 PM  Result Value Ref Range   Preg Test, Ur Negative Negative  CBC with Differential/Platelet     Status: None   Collection Time: 06/20/23  8:52 AM  Result Value Ref Range   WBC 8.4 3.4 - 10.8 x10E3/uL   RBC 4.89 3.77 - 5.28 x10E6/uL   Hemoglobin 14.1 11.1 - 15.9 g/dL   Hematocrit 16.1 09.6 - 46.6 %   MCV 89 79 - 97 fL   MCH 28.8 26.6 - 33.0 pg   MCHC 32.6 31.5 - 35.7 g/dL   RDW 04.5 40.9 - 81.1 %   Platelets 192 150 - 450 x10E3/uL   Neutrophils 70 Not Estab. %   Lymphs 20 Not Estab. %   Monocytes 6 Not Estab. %   Eos 3 Not Estab. %   Basos 1 Not Estab. %   Neutrophils Absolute 5.9 1.4 - 7.0 x10E3/uL   Lymphocytes Absolute 1.7 0.7 - 3.1 x10E3/uL   Monocytes Absolute  0.5 0.1 -  0.9 x10E3/uL   EOS (ABSOLUTE) 0.2 0.0 - 0.4 x10E3/uL   Basophils Absolute 0.1 0.0 - 0.2 x10E3/uL   Immature Granulocytes 0 Not Estab. %   Immature Grans (Abs) 0.0 0.0 - 0.1 x10E3/uL  Comprehensive metabolic panel     Status: Abnormal   Collection Time: 06/20/23  8:52 AM  Result Value Ref Range   Glucose 99 70 - 99 mg/dL   BUN 9 6 - 20 mg/dL   Creatinine, Ser 8.11 0.57 - 1.00 mg/dL   eGFR 914 >78 GN/FAO/1.30   BUN/Creatinine Ratio 14 9 - 23   Sodium 140 134 - 144 mmol/L   Potassium 4.2 3.5 - 5.2 mmol/L   Chloride 104 96 - 106 mmol/L   CO2 24 20 - 29 mmol/L   Calcium 8.5 (L) 8.7 - 10.2 mg/dL   Total Protein 5.5 (L) 6.0 - 8.5 g/dL   Albumin 3.6 (L) 3.9 - 4.9 g/dL   Globulin, Total 1.9 1.5 - 4.5 g/dL   Bilirubin Total 0.5 0.0 - 1.2 mg/dL   Alkaline Phosphatase 46 44 - 121 IU/L   AST 13 0 - 40 IU/L   ALT 11 0 - 32 IU/L  Lipid Panel w/o Chol/HDL Ratio     Status: Abnormal   Collection Time: 06/20/23  8:52 AM  Result Value Ref Range   Cholesterol, Total 116 100 - 199 mg/dL   Triglycerides 865 0 - 149 mg/dL   HDL 33 (L) >78 mg/dL   VLDL Cholesterol Cal 25 5 - 40 mg/dL   LDL Chol Calc (NIH) 58 0 - 99 mg/dL  Hgb I6N w/o eAG     Status: None   Collection Time: 06/20/23  8:52 AM  Result Value Ref Range   Hgb A1c MFr Bld 5.6 4.8 - 5.6 %    Comment:          Prediabetes: 5.7 - 6.4          Diabetes: >6.4          Glycemic control for adults with diabetes: <7.0   TSH     Status: None   Collection Time: 06/20/23  8:52 AM  Result Value Ref Range   TSH 1.530 0.450 - 4.500 uIU/mL  Vitamin B12     Status: None   Collection Time: 06/20/23  8:52 AM  Result Value Ref Range   Vitamin B-12 687 232 - 1,245 pg/mL      Assessment & Plan:  Patient doing well. Will increase protein intake as discussed. Pap smear and blood work at next visit. Continue all medications.  Problem List Items Addressed This Visit     Iron deficiency anemia due to chronic blood loss   Relevant Orders    CBC With Diff/Platelet   Hyperlipidemia - Primary   Relevant Orders   TSH   Lipid Profile   CMP14+EGFR   Anxiety disorder   Relevant Medications   citalopram (CELEXA) 10 MG tablet   Other Relevant Orders   TSH   Other Visit Diagnoses     Diabetes mellitus screening       Relevant Orders   HgB A1c   Vitamin B deficiency       Relevant Orders   Vitamin B12       Return in about 4 months (around 10/26/2023) for with blood work and pap smear.   Total time spent: 25 minutes  Margaretann Loveless, MD  06/25/2023   This document may have been prepared by [facility] Voice Recognition software  and as such may include unintentional dictation errors.

## 2023-06-27 NOTE — Progress Notes (Signed)
Established Patient Office Visit  Subjective:  Patient ID: AERICA Trevino, female    DOB: March 04, 1986  Age: 37 y.o. MRN: [id number]  Chief Complaint  Patient presents with   Follow-up    6 month follow up, discuss lab results.    Patient in office for 4 month follow up, discuss lab results. Patient has no complaints today. Wearing and benefiting from her CPAP. Patient had her fasting labs done. Protein continues to be low. Patient admits to not increasing protein as instructed at previous visit. Discussed various sources of protein such as protein drinks, carnation instant breakfast shakes or protein bars. Patent understands to check sugar content of protein bars before consuming. Continue to diet and exercise. All other blood work within normal limits. Patient due for a pap smear this year, will schedule at next visit. Mammogram 09/2021 for breast pain, was normal. Due for next mammogram at age 25.     No other concerns at this time.   Past Medical History:  Diagnosis Date   Anemia    Anxiety    Asthma    Depression    Menorrhagia     Past Surgical History:  Procedure Laterality Date   CESAREAN SECTION  2012   TUBAL LIGATION      Social History   Socioeconomic History   Marital status: Divorced    Spouse name: Not on file   Number of children: 3   Years of education: Not on file   Highest education level: Some college, no degree  Occupational History   Not on file  Tobacco Use   Smoking status: Former    Packs/day: 0.25    Years: 5.00    Additional pack years: 0.00    Total pack years: 1.25    Types: Cigarettes    Quit date: 02/22/2016    Years since quitting: 7.3   Smokeless tobacco: Never  Vaping Use   Vaping Use: Never used  Substance and Sexual Activity   Alcohol use: No   Drug use: No   Sexual activity: Yes    Birth control/protection: I.U.D.  Other Topics Concern   Not on file  Social History Narrative   Not on file   Social Determinants of  Health   Financial Resource Strain: Not on file  Food Insecurity: Not on file  Transportation Needs: Not on file  Physical Activity: Sufficiently Active (11/04/2018)   Exercise Vital Sign    Days of Exercise per Week: 7 days    Minutes of Exercise per Session: 40 min  Stress: Not on file  Social Connections: Not on file  Intimate Partner Violence: Not on file    Family History  Problem Relation Age of Onset   Heart failure Mother    Hypertension Mother    Alcohol abuse Father    Depression Father    Alcohol abuse Brother    Schizophrenia Brother    Autism spectrum disorder Son    Autism spectrum disorder Son    ADD / ADHD Daughter     Allergies  Allergen Reactions   Penicillins Rash    Review of Systems  Constitutional: Negative.   HENT: Negative.    Eyes: Negative.   Respiratory: Negative.  Negative for cough and shortness of breath.   Cardiovascular: Negative.  Negative for chest pain, palpitations and leg swelling.  Gastrointestinal: Negative.  Negative for abdominal pain, constipation, diarrhea, heartburn, nausea and vomiting.  Genitourinary: Negative.  Negative for dysuria and flank pain.  Musculoskeletal:  Negative.  Negative for joint pain and myalgias.  Skin: Negative.   Neurological: Negative.  Negative for dizziness and headaches.  Endo/Heme/Allergies: Negative.   Psychiatric/Behavioral: Negative.  Negative for depression and suicidal ideas. The patient is not nervous/anxious.        Objective:   BP 110/80   Pulse 76   Ht 5\' 4"  (1.626 m)   Wt 257 lb 6.4 oz (116.8 kg)   SpO2 98%   BMI 44.18 kg/m   Vitals:   06/25/23 0931  BP: 110/80  Pulse: 76  Height: 5\' 4"  (1.626 m)  Weight: 257 lb 6.4 oz (116.8 kg)  SpO2: 98%  BMI (Calculated): 44.16    Physical Exam Vitals and nursing note reviewed.  Constitutional:      Appearance: Normal appearance.  HENT:     Head: Normocephalic and atraumatic.     Nose: Nose normal.     Mouth/Throat:      Mouth: Mucous membranes are moist.     Pharynx: Oropharynx is clear.  Eyes:     Conjunctiva/sclera: Conjunctivae normal.     Pupils: Pupils are equal, round, and reactive to light.  Cardiovascular:     Rate and Rhythm: Normal rate and regular rhythm.     Pulses: Normal pulses.     Heart sounds: Normal heart sounds. No murmur heard. Pulmonary:     Effort: Pulmonary effort is normal.     Breath sounds: Normal breath sounds. No wheezing.  Abdominal:     General: Bowel sounds are normal.     Palpations: Abdomen is soft.     Tenderness: There is no abdominal tenderness. There is no right CVA tenderness or left CVA tenderness.  Musculoskeletal:        General: Normal range of motion.     Cervical back: Normal range of motion.     Right lower leg: No edema.     Left lower leg: No edema.  Skin:    General: Skin is warm and dry.  Neurological:     General: No focal deficit present.     Mental Status: She is alert and oriented to person, place, and time.  Psychiatric:        Mood and Affect: Mood normal.        Behavior: Behavior normal.      No results found for any visits on 06/25/23.  Recent Results (from the past 2160 hour(s))  POCT urine pregnancy     Status: Normal   Collection Time: 04/03/23  3:46 PM  Result Value Ref Range   Preg Test, Ur Negative Negative  CBC with Differential/Platelet     Status: None   Collection Time: 06/20/23  8:52 AM  Result Value Ref Range   WBC 8.4 3.4 - 10.8 x10E3/uL   RBC 4.89 3.77 - 5.28 x10E6/uL   Hemoglobin 14.1 11.1 - 15.9 g/dL   Hematocrit 16.1 09.6 - 46.6 %   MCV 89 79 - 97 fL   MCH 28.8 26.6 - 33.0 pg   MCHC 32.6 31.5 - 35.7 g/dL   RDW 04.5 40.9 - 81.1 %   Platelets 192 150 - 450 x10E3/uL   Neutrophils 70 Not Estab. %   Lymphs 20 Not Estab. %   Monocytes 6 Not Estab. %   Eos 3 Not Estab. %   Basos 1 Not Estab. %   Neutrophils Absolute 5.9 1.4 - 7.0 x10E3/uL   Lymphocytes Absolute 1.7 0.7 - 3.1 x10E3/uL   Monocytes Absolute  0.5 0.1 -  0.9 x10E3/uL   EOS (ABSOLUTE) 0.2 0.0 - 0.4 x10E3/uL   Basophils Absolute 0.1 0.0 - 0.2 x10E3/uL   Immature Granulocytes 0 Not Estab. %   Immature Grans (Abs) 0.0 0.0 - 0.1 x10E3/uL  Comprehensive metabolic panel     Status: Abnormal   Collection Time: 06/20/23  8:52 AM  Result Value Ref Range   Glucose 99 70 - 99 mg/dL   BUN 9 6 - 20 mg/dL   Creatinine, Ser 1.61 0.57 - 1.00 mg/dL   eGFR 096 >04 VW/UJW/1.19   BUN/Creatinine Ratio 14 9 - 23   Sodium 140 134 - 144 mmol/L   Potassium 4.2 3.5 - 5.2 mmol/L   Chloride 104 96 - 106 mmol/L   CO2 24 20 - 29 mmol/L   Calcium 8.5 (L) 8.7 - 10.2 mg/dL   Total Protein 5.5 (L) 6.0 - 8.5 g/dL   Albumin 3.6 (L) 3.9 - 4.9 g/dL   Globulin, Total 1.9 1.5 - 4.5 g/dL   Bilirubin Total 0.5 0.0 - 1.2 mg/dL   Alkaline Phosphatase 46 44 - 121 IU/L   AST 13 0 - 40 IU/L   ALT 11 0 - 32 IU/L  Lipid Panel w/o Chol/HDL Ratio     Status: Abnormal   Collection Time: 06/20/23  8:52 AM  Result Value Ref Range   Cholesterol, Total 116 100 - 199 mg/dL   Triglycerides 147 0 - 149 mg/dL   HDL 33 (L) >82 mg/dL   VLDL Cholesterol Cal 25 5 - 40 mg/dL   LDL Chol Calc (NIH) 58 0 - 99 mg/dL  Hgb N5A w/o eAG     Status: None   Collection Time: 06/20/23  8:52 AM  Result Value Ref Range   Hgb A1c MFr Bld 5.6 4.8 - 5.6 %    Comment:          Prediabetes: 5.7 - 6.4          Diabetes: >6.4          Glycemic control for adults with diabetes: <7.0   TSH     Status: None   Collection Time: 06/20/23  8:52 AM  Result Value Ref Range   TSH 1.530 0.450 - 4.500 uIU/mL  Vitamin B12     Status: None   Collection Time: 06/20/23  8:52 AM  Result Value Ref Range   Vitamin B-12 687 232 - 1,245 pg/mL      Assessment & Plan:  Patient doing well. Will increase protein intake as discussed. Pap smear and blood work at next visit. Continue all medications.  Problem List Items Addressed This Visit       Other   Iron deficiency anemia due to chronic blood loss    Relevant Orders   CBC With Diff/Platelet   Hyperlipidemia - Primary   Relevant Orders   TSH   Lipid Profile   CMP14+EGFR   Anxiety disorder   Relevant Medications   citalopram (CELEXA) 10 MG tablet   Other Relevant Orders   TSH   Other Visit Diagnoses     Diabetes mellitus screening       Relevant Orders   HgB A1c   Vitamin B deficiency       Relevant Orders   Vitamin B12       Return in about 4 months (around 10/26/2023) for with blood work and pap smear.   Total time spent: 25 minutes  Google, NP  06/25/2023   This document may have been prepared  by [employer] and as such may include unintentional dictation errors.

## 2023-06-28 ENCOUNTER — Telehealth: Payer: Self-pay | Admitting: Nurse Practitioner

## 2023-06-28 NOTE — Telephone Encounter (Signed)
Patient left VM that she needs her citalopram 10 mg sent to [facility]. This was done on 7/8. Calling pharmacy to see what the issue is.

## 2023-08-09 ENCOUNTER — Ambulatory Visit: Payer: Medicaid Other | Admitting: Podiatry

## 2023-08-09 DIAGNOSIS — M722 Plantar fascial fibromatosis: Secondary | ICD-10-CM

## 2023-08-09 DIAGNOSIS — M62462 Contracture of muscle, left lower leg: Secondary | ICD-10-CM | POA: Diagnosis not present

## 2023-08-09 NOTE — Progress Notes (Signed)
Subjective:  Patient ID: Cathy Trevino, female    DOB: [birth year],  MRN: [id number]  Chief Complaint  Patient presents with   Plantar Fasciitis    Pt stated that she is still having some pain in her foot     37 y.o. female presents with the above complaint.  Patient presents with new complaint of left Planter fasciitis.  She states she is doing a lot better denies any other acute complaints.  She states it came out of nowhere has been hurting her to the right side is doing really good.  Pain scale is 5 out of 10 dull achy in nature.   Review of Systems: Negative except as noted in the HPI. Denies N/V/F/Ch.  Past Medical History:  Diagnosis Date   Anemia    Anxiety    Asthma    Depression    Menorrhagia     Current Outpatient Medications:    acetaminophen (TYLENOL) 500 MG tablet, Take 500 mg by mouth every 6 (six) hours as needed., Disp: , Rfl:    cetirizine (ZYRTEC) 10 MG tablet, Cetirizine HCl 10 MG Oral Tablet QTY: 30 tablet Days: 30 Refills: 3  Written: 02/10/20 Patient Instructions: Take 1 tablet by mouth nightly at bedtime as needed for seasonal allergies, Disp: , Rfl:    citalopram (CELEXA) 10 MG tablet, Take 1 tablet (10 mg total) by mouth daily., Disp: 30 tablet, Rfl: 6   cyclobenzaprine (FLEXERIL) 10 MG tablet, Take 1 tablet (10 mg total) by mouth 3 (three) times daily as needed for muscle spasms., Disp: 30 tablet, Rfl: 0   EPINEPHrine 0.3 mg/0.3 mL IJ SOAJ injection, Inject 0.3 mLs into the muscle See admin instructions., Disp: , Rfl:    ergocalciferol (VITAMIN D2) 1.25 MG (50000 UT) capsule, Take 50,000 Units by mouth once a week., Disp: , Rfl:    fluticasone (FLONASE) 50 MCG/ACT nasal spray, Place into both nostrils daily., Disp: , Rfl:    hydrOXYzine (VISTARIL) 25 MG capsule, Take 1 capsule (25 mg total) by mouth 2 (two) times daily as needed for anxiety., Disp: 60 capsule, Rfl: 1   ibuprofen (ADVIL) 800 MG tablet, Take 1 tablet (800 mg total) by mouth every 8 (eight)  hours as needed., Disp: 90 tablet, Rfl: 1   Multiple Vitamin (MULTIVITAMIN ADULT PO), Take 1 tablet by mouth daily at 8 pm., Disp: , Rfl:    rosuvastatin (CRESTOR) 20 MG tablet, Take 20 mg by mouth at bedtime., Disp: , Rfl:    VITAMIN B1-B12 IM, Inject into the muscle every 30 (thirty) days., Disp: , Rfl:   Social History   Tobacco Use  Smoking Status Former   Current packs/day: 0.00   Average packs/day: 0.3 packs/day for 5.0 years (1.3 ttl pk-yrs)   Types: Cigarettes   Start date: 02/22/2011   Quit date: 02/22/2016   Years since quitting: 7.4  Smokeless Tobacco Never    Allergies  Allergen Reactions   Penicillins Rash   Objective:  There were no vitals filed for this visit. There is no height or weight on file to calculate BMI. Constitutional Well developed. Well nourished.  Vascular Dorsalis pedis pulses palpable bilaterally. Posterior tibial pulses palpable bilaterally. Capillary refill normal to all digits.  No cyanosis or clubbing noted. Pedal hair growth normal.  Neurologic Normal speech. Oriented to person, place, and time. Epicritic sensation to light touch grossly present bilaterally.  Dermatologic Nails well groomed and normal in appearance. No open wounds. No skin lesions.  Orthopedic: Normal joint ROM  without pain or crepitus bilaterally. No visible deformities. Tender to palpation at the calcaneal tuber left. No pain with calcaneal squeeze left. Ankle ROM diminished range of motion left. Silfverskiold Test: positive left.   Radiographs: None  Assessment:   1. Plantar fasciitis of left foot   2. Gastrocnemius equinus, left    Plan:  Patient was evaluated and treated and all questions answered.  Plantar Fasciitis, left with underlying gastrocnemius equinus - XR reviewed as above.  - Educated on icing and stretching. Instructions given.  - Injection delivered to the plantar fascia as below. - DME: Plantar fascial brace dispensed to support the medial  longitudinal arch of the foot and offload pressure from the heel and prevent arch collapse during weightbearing - Pharmacologic management: None  Procedure: Injection Tendon/Ligament Location: Left plantar fascia at the glabrous junction; medial approach. Skin Prep: alcohol Injectate: 0.5 cc 0.5% marcaine plain, 0.5 cc of 1% Lidocaine, 0.5 cc kenalog 10. Disposition: Patient tolerated procedure well. Injection site dressed with a band-aid.  No follow-ups on file.

## 2023-08-13 ENCOUNTER — Ambulatory Visit: Payer: Medicaid Other | Admitting: Cardiology

## 2023-08-13 DIAGNOSIS — M545 Low back pain, unspecified: Secondary | ICD-10-CM | POA: Diagnosis not present

## 2023-08-13 LAB — POCT URINALYSIS DIPSTICK
Bilirubin, UA: NEGATIVE
Blood, UA: NEGATIVE
Glucose, UA: NEGATIVE
Ketones, UA: NEGATIVE
Leukocytes, UA: NEGATIVE
Nitrite, UA: NEGATIVE
Protein, UA: NEGATIVE
Spec Grav, UA: 1.02 (ref 1.010–1.025)
Urobilinogen, UA: 0.2 E.U./dL
pH, UA: 6 (ref 5.0–8.0)

## 2023-08-21 ENCOUNTER — Ambulatory Visit
Admission: RE | Admit: 2023-08-21 | Discharge: 2023-08-21 | Disposition: A | Discharge status: Home / Self Care | Payer: Medicaid Other | Attending: Cardiology | Admitting: Cardiology

## 2023-08-21 ENCOUNTER — Ambulatory Visit: Payer: Medicaid Other | Admitting: Cardiology

## 2023-08-21 ENCOUNTER — Encounter: Payer: Self-pay | Admitting: Cardiology

## 2023-08-21 ENCOUNTER — Ambulatory Visit
Admission: RE | Admit: 2023-08-21 | Discharge: 2023-08-21 | Disposition: A | Discharge status: Home / Self Care | Payer: Medicaid Other | Source: Ambulatory Visit | Attending: Cardiology | Admitting: Cardiology

## 2023-08-21 VITALS — BP 110/76 | HR 98 | Ht 64.0 in | Wt 248.0 lb

## 2023-08-21 DIAGNOSIS — M545 Low back pain, unspecified: Secondary | ICD-10-CM | POA: Diagnosis present

## 2023-08-21 DIAGNOSIS — F411 Generalized anxiety disorder: Secondary | ICD-10-CM | POA: Diagnosis not present

## 2023-08-21 LAB — POCT URINALYSIS DIPSTICK
Bilirubin, UA: NEGATIVE
Blood, UA: NEGATIVE
Glucose, UA: NEGATIVE
Ketones, UA: NEGATIVE
Leukocytes, UA: NEGATIVE
Nitrite, UA: NEGATIVE
Protein, UA: NEGATIVE
Spec Grav, UA: 1.02 (ref 1.010–1.025)
Urobilinogen, UA: 1 U/dL
pH, UA: 7 (ref 5.0–8.0)

## 2023-08-21 MED ORDER — PREDNISONE 20 MG PO TABS: 40.0000 mg | ORAL_TABLET | Freq: Every day | ORAL | 0 refills | Status: AC

## 2023-08-21 MED ORDER — CYCLOBENZAPRINE HCL 10 MG PO TABS: 10.0000 mg | ORAL_TABLET | Freq: Three times a day (TID) | ORAL | 0 refills | Status: DC | PRN

## 2023-08-21 MED ORDER — MELOXICAM 15 MG PO TABS
15.0000 mg | ORAL_TABLET | Freq: Every day | ORAL | 1 refills | Status: DC
Start: 2023-08-21 — End: 2023-10-16

## 2023-08-21 MED ORDER — HYDROXYZINE PAMOATE 25 MG PO CAPS
25.0000 mg | ORAL_CAPSULE | Freq: Two times a day (BID) | ORAL | 1 refills | Status: DC | PRN
Start: 2023-08-21 — End: 2024-01-30

## 2023-08-21 NOTE — Progress Notes (Signed)
Established Patient Office Visit  Subjective:  Patient ID: Cathy Trevino, female    DOB: 29-Aug-1986  Age: 37 y.o. MRN: [id number]  Chief Complaint  Patient presents with   Acute Visit    Left side pain    Patient in office complaining of left side back pain, left hip pain, left leg pain. Tingling in left thigh only. Started 2 weeks ago. Patient complaining of left lower back pain, radiates down hip to left leg. Left thigh tingling only. Will order a lumbar spine xray. Meloxicam for pain, and prednisone.     No other concerns at this time.   Past Medical History:  Diagnosis Date   Anemia    Anxiety    Asthma    Depression    Menorrhagia     Past Surgical History:  Procedure Laterality Date   CESAREAN SECTION  2012   TUBAL LIGATION      Social History   Socioeconomic History   Marital status: Divorced    Spouse name: Not on file   Number of children: 3   Years of education: Not on file   Highest education level: Some college, no degree  Occupational History   Not on file  Tobacco Use   Smoking status: Former    Current packs/day: 0.00    Average packs/day: 0.3 packs/day for 5.0 years (1.3 ttl pk-yrs)    Types: Cigarettes    Start date: 02/22/2011    Quit date: 02/22/2016    Years since quitting: 7.4   Smokeless tobacco: Never  Vaping Use   Vaping status: Never Used  Substance and Sexual Activity   Alcohol use: No   Drug use: No   Sexual activity: Yes    Birth control/protection: I.U.D.  Other Topics Concern   Not on file  Social History Narrative   Not on file   Social Determinants of Health   Financial Resource Strain: Not on file  Food Insecurity: Not on file  Transportation Needs: Not on file  Physical Activity: Sufficiently Active (11/04/2018)   Exercise Vital Sign    Days of Exercise per Week: 7 days    Minutes of Exercise per Session: 40 min  Stress: Not on file  Social Connections: Not on file  Intimate Partner Violence: Not on file     Family History  Problem Relation Age of Onset   Heart failure Mother    Hypertension Mother    Alcohol abuse Father    Depression Father    Alcohol abuse Brother    Schizophrenia Brother    Autism spectrum disorder Son    Autism spectrum disorder Son    ADD / ADHD Daughter     Allergies  Allergen Reactions   Penicillins Rash    Review of Systems  Constitutional: Negative.   HENT: Negative.    Eyes: Negative.   Respiratory: Negative.  Negative for shortness of breath.   Cardiovascular: Negative.  Negative for chest pain.  Gastrointestinal:  Negative for abdominal pain, constipation and diarrhea.  Genitourinary: Negative.   Musculoskeletal:  Positive for back pain. Negative for joint pain and myalgias.  Skin: Negative.   Neurological:  Positive for tingling. Negative for dizziness and headaches.  Endo/Heme/Allergies: Negative.   All other systems reviewed and are negative.      Objective:   BP 110/76   Pulse 98   Ht 5\' 4"  (1.626 m)   Wt 248 lb (112.5 kg)   SpO2 98%   BMI 42.57 kg/m  Vitals:   08/21/23 1131  BP: 110/76  Pulse: 98  Height: 5\' 4"  (1.626 m)  Weight: 248 lb (112.5 kg)  SpO2: 98%  BMI (Calculated): 42.55    Physical Exam Vitals and nursing note reviewed.  Constitutional:      Appearance: Normal appearance. She is normal weight.  HENT:     Head: Normocephalic and atraumatic.     Nose: Nose normal.     Mouth/Throat:     Mouth: Mucous membranes are moist.  Eyes:     Extraocular Movements: Extraocular movements intact.     Conjunctiva/sclera: Conjunctivae normal.     Pupils: Pupils are equal, round, and reactive to light.  Cardiovascular:     Rate and Rhythm: Normal rate and regular rhythm.     Pulses: Normal pulses.     Heart sounds: Normal heart sounds.  Pulmonary:     Effort: Pulmonary effort is normal.     Breath sounds: Normal breath sounds.  Abdominal:     General: Abdomen is flat. Bowel sounds are normal.     Palpations:  Abdomen is soft.  Musculoskeletal:        General: Normal range of motion.     Cervical back: Normal range of motion.  Skin:    General: Skin is warm and dry.  Neurological:     General: No focal deficit present.     Mental Status: She is alert and oriented to person, place, and time.  Psychiatric:        Mood and Affect: Mood normal.        Behavior: Behavior normal.        Thought Content: Thought content normal.        Judgment: Judgment normal.      Results for orders placed or performed in visit on 08/21/23  POCT Urinalysis Dipstick (81002)  Result Value Ref Range   Color, UA yellow    Clarity, UA clear    Glucose, UA Negative Negative   Bilirubin, UA neg    Ketones, UA neg    Spec Grav, UA 1.020 1.010 - 1.025   Blood, UA neg    pH, UA 7.0 5.0 - 8.0   Protein, UA Negative Negative   Urobilinogen, UA 1.0 0.2 or 1.0 E.U./dL   Nitrite, UA neg    Leukocytes, UA Negative Negative   Appearance clear    Odor yes     Recent Results (from the past 2160 hour(s))  CBC with Differential/Platelet     Status: None   Collection Time: 06/20/23  8:52 AM  Result Value Ref Range   WBC 8.4 3.4 - 10.8 x10E3/uL   RBC 4.89 3.77 - 5.28 x10E6/uL   Hemoglobin 14.1 11.1 - 15.9 g/dL   Hematocrit 11.9 14.7 - 46.6 %   MCV 89 79 - 97 fL   MCH 28.8 26.6 - 33.0 pg   MCHC 32.6 31.5 - 35.7 g/dL   RDW 82.9 56.2 - 13.0 %   Platelets 192 150 - 450 x10E3/uL   Neutrophils 70 Not Estab. %   Lymphs 20 Not Estab. %   Monocytes 6 Not Estab. %   Eos 3 Not Estab. %   Basos 1 Not Estab. %   Neutrophils Absolute 5.9 1.4 - 7.0 x10E3/uL   Lymphocytes Absolute 1.7 0.7 - 3.1 x10E3/uL   Monocytes Absolute 0.5 0.1 - 0.9 x10E3/uL   EOS (ABSOLUTE) 0.2 0.0 - 0.4 x10E3/uL   Basophils Absolute 0.1 0.0 - 0.2 x10E3/uL   Immature  Granulocytes 0 Not Estab. %   Immature Grans (Abs) 0.0 0.0 - 0.1 x10E3/uL  Comprehensive metabolic panel     Status: Abnormal   Collection Time: 06/20/23  8:52 AM  Result Value Ref  Range   Glucose 99 70 - 99 mg/dL   BUN 9 6 - 20 mg/dL   Creatinine, Ser 8.65 0.57 - 1.00 mg/dL   eGFR 784 >69 GE/XBM/8.41   BUN/Creatinine Ratio 14 9 - 23   Sodium 140 134 - 144 mmol/L   Potassium 4.2 3.5 - 5.2 mmol/L   Chloride 104 96 - 106 mmol/L   CO2 24 20 - 29 mmol/L   Calcium 8.5 (L) 8.7 - 10.2 mg/dL   Total Protein 5.5 (L) 6.0 - 8.5 g/dL   Albumin 3.6 (L) 3.9 - 4.9 g/dL   Globulin, Total 1.9 1.5 - 4.5 g/dL   Bilirubin Total 0.5 0.0 - 1.2 mg/dL   Alkaline Phosphatase 46 44 - 121 IU/L   AST 13 0 - 40 IU/L   ALT 11 0 - 32 IU/L  Lipid Panel w/o Chol/HDL Ratio     Status: Abnormal   Collection Time: 06/20/23  8:52 AM  Result Value Ref Range   Cholesterol, Total 116 100 - 199 mg/dL   Triglycerides 324 0 - 149 mg/dL   HDL 33 (L) >40 mg/dL   VLDL Cholesterol Cal 25 5 - 40 mg/dL   LDL Chol Calc (NIH) 58 0 - 99 mg/dL  Hgb N0U w/o eAG     Status: None   Collection Time: 06/20/23  8:52 AM  Result Value Ref Range   Hgb A1c MFr Bld 5.6 4.8 - 5.6 %    Comment:          Prediabetes: 5.7 - 6.4          Diabetes: >6.4          Glycemic control for adults with diabetes: <7.0   TSH     Status: None   Collection Time: 06/20/23  8:52 AM  Result Value Ref Range   TSH 1.530 0.450 - 4.500 uIU/mL  Vitamin B12     Status: None   Collection Time: 06/20/23  8:52 AM  Result Value Ref Range   Vitamin B-12 687 232 - 1,245 pg/mL  POCT Urinalysis Dipstick (81002)     Status: None   Collection Time: 08/13/23 10:15 AM  Result Value Ref Range   Color, UA orange    Clarity, UA cloudy    Glucose, UA Negative Negative   Bilirubin, UA neg    Ketones, UA neg    Spec Grav, UA 1.020 1.010 - 1.025   Blood, UA neg    pH, UA 6.0 5.0 - 8.0   Protein, UA Negative Negative   Urobilinogen, UA 0.2 0.2 or 1.0 E.U./dL   Nitrite, UA neg    Leukocytes, UA Negative Negative   Appearance cloudy    Odor yes   POCT Urinalysis Dipstick (72536)     Status: None   Collection Time: 08/21/23 11:40 AM  Result  Value Ref Range   Color, UA yellow    Clarity, UA clear    Glucose, UA Negative Negative   Bilirubin, UA neg    Ketones, UA neg    Spec Grav, UA 1.020 1.010 - 1.025   Blood, UA neg    pH, UA 7.0 5.0 - 8.0   Protein, UA Negative Negative   Urobilinogen, UA 1.0 0.2 or 1.0 E.U./dL   Nitrite, UA  neg    Leukocytes, UA Negative Negative   Appearance clear    Odor yes       Assessment & Plan:  Lumbar spine xray. Meloxicam for pain. Prednisone.  Problem List Items Addressed This Visit       Other   Anxiety disorder   Relevant Medications   hydrOXYzine (VISTARIL) 25 MG capsule   Other Visit Diagnoses     Low back pain, unspecified back pain laterality, unspecified chronicity, unspecified whether sciatica present    -  Primary   Relevant Medications   cyclobenzaprine (FLEXERIL) 10 MG tablet   meloxicam (MOBIC) 15 MG tablet   predniSONE (DELTASONE) 20 MG tablet   Other Relevant Orders   POCT Urinalysis Dipstick (08657) (Completed)   DG Lumbar Spine Complete       Return if symptoms worsen or fail to improve.   Total time spent: 25 minutes  Google, NP  08/21/2023   This document may have been prepared by [employer] and as such may include unintentional dictation errors.

## 2023-08-22 NOTE — Progress Notes (Signed)
Patient notified

## 2023-09-13 ENCOUNTER — Ambulatory Visit: Payer: Medicaid Other

## 2023-09-13 DIAGNOSIS — Z23 Encounter for immunization: Secondary | ICD-10-CM

## 2023-09-13 DIAGNOSIS — Z719 Counseling, unspecified: Secondary | ICD-10-CM

## 2023-09-13 NOTE — Progress Notes (Signed)
In nurse clinic requesting flu and covid vaccine.  See immunization flowsheet.  VISs given.    Comirnaty 12+ 2024-25 formula and flu vaccine given; tolerated well.  [facility] updated and copy given.  Cherlynn Polo, RN

## 2023-09-26 ENCOUNTER — Other Ambulatory Visit: Payer: Self-pay | Admitting: Cardiology

## 2023-09-27 MED ORDER — ROSUVASTATIN CALCIUM 20 MG PO TABS: 20.0000 mg | ORAL_TABLET | Freq: Every day | ORAL | 1 refills | Status: DC

## 2023-10-16 ENCOUNTER — Other Ambulatory Visit: Payer: Self-pay | Admitting: Cardiology

## 2023-10-16 MED ORDER — MELOXICAM 15 MG PO TABS: 15.0000 mg | ORAL_TABLET | Freq: Every day | ORAL | 1 refills | Status: DC

## 2023-10-23 ENCOUNTER — Other Ambulatory Visit: Payer: Medicaid Other

## 2023-10-23 DIAGNOSIS — E782 Mixed hyperlipidemia: Secondary | ICD-10-CM

## 2023-10-23 DIAGNOSIS — Z131 Encounter for screening for diabetes mellitus: Secondary | ICD-10-CM

## 2023-10-23 DIAGNOSIS — F411 Generalized anxiety disorder: Secondary | ICD-10-CM

## 2023-10-23 DIAGNOSIS — E539 Vitamin B deficiency, unspecified: Secondary | ICD-10-CM

## 2023-10-23 DIAGNOSIS — D500 Iron deficiency anemia secondary to blood loss (chronic): Secondary | ICD-10-CM

## 2023-10-24 LAB — CMP14+EGFR
ALT: 12 [IU]/L (ref 0–32)
AST: 12 [IU]/L (ref 0–40)
Albumin: 3.7 g/dL — ABNORMAL LOW (ref 3.9–4.9)
Alkaline Phosphatase: 42 [IU]/L — ABNORMAL LOW (ref 44–121)
BUN/Creatinine Ratio: 16 (ref 9–23)
BUN: 10 mg/dL (ref 6–20)
Bilirubin Total: 0.4 mg/dL (ref 0.0–1.2)
CO2: 23 mmol/L (ref 20–29)
Calcium: 8.6 mg/dL — ABNORMAL LOW (ref 8.7–10.2)
Chloride: 108 mmol/L — ABNORMAL HIGH (ref 96–106)
Creatinine, Ser: 0.61 mg/dL (ref 0.57–1.00)
Globulin, Total: 1.8 g/dL (ref 1.5–4.5)
Glucose: 90 mg/dL (ref 70–99)
Potassium: 4.3 mmol/L (ref 3.5–5.2)
Sodium: 144 mmol/L (ref 134–144)
Total Protein: 5.5 g/dL — ABNORMAL LOW (ref 6.0–8.5)
eGFR: 117 mL/min/{1.73_m2} (ref >59)

## 2023-10-24 LAB — CBC WITH DIFF/PLATELET
Basophils Absolute: 0.1 10*3/uL (ref 0.0–0.2)
Basos: 1 %
EOS (ABSOLUTE): 0.2 10*3/uL (ref 0.0–0.4)
Eos: 2 %
Hematocrit: 41 % (ref 34.0–46.6)
Hemoglobin: 13.3 g/dL (ref 11.1–15.9)
Immature Grans (Abs): 0 10*3/uL (ref 0.0–0.1)
Immature Granulocytes: 0 %
Lymphocytes Absolute: 1.5 10*3/uL (ref 0.7–3.1)
Lymphs: 23 %
MCH: 30 pg (ref 26.6–33.0)
MCHC: 32.4 g/dL (ref 31.5–35.7)
MCV: 92 fL (ref 79–97)
Monocytes Absolute: 0.5 10*3/uL (ref 0.1–0.9)
Monocytes: 7 %
Neutrophils Absolute: 4.4 10*3/uL (ref 1.4–7.0)
Neutrophils: 67 %
Platelets: 188 10*3/uL (ref 150–450)
RBC: 4.44 x10E6/uL (ref 3.77–5.28)
RDW: 13.1 % (ref 11.7–15.4)
WBC: 6.6 10*3/uL (ref 3.4–10.8)

## 2023-10-24 LAB — LIPID PANEL
Chol/HDL Ratio: 3.8 ratio (ref 0.0–4.4)
Cholesterol, Total: 139 mg/dL (ref 100–199)
HDL: 37 mg/dL — ABNORMAL LOW (ref >39)
LDL Chol Calc (NIH): 81 mg/dL (ref 0–99)
Triglycerides: 114 mg/dL (ref 0–149)
VLDL Cholesterol Cal: 21 mg/dL (ref 5–40)

## 2023-10-24 LAB — HEMOGLOBIN A1C
Est. average glucose Bld gHb Est-mCnc: 108 mg/dL
Hgb A1c MFr Bld: 5.4 % (ref 4.8–5.6)

## 2023-10-24 LAB — TSH: TSH: 1.45 u[IU]/mL (ref 0.450–4.500)

## 2023-10-24 LAB — VITAMIN B12: Vitamin B-12: 1303 pg/mL — ABNORMAL HIGH (ref 232–1245)

## 2023-10-26 ENCOUNTER — Ambulatory Visit: Payer: Medicaid Other | Admitting: Cardiology

## 2023-10-30 ENCOUNTER — Other Ambulatory Visit: Payer: Self-pay | Admitting: Family

## 2023-10-30 ENCOUNTER — Encounter: Payer: Self-pay | Admitting: Internal Medicine

## 2023-10-30 ENCOUNTER — Ambulatory Visit (INDEPENDENT_AMBULATORY_CARE_PROVIDER_SITE_OTHER): Payer: Medicaid Other | Admitting: Internal Medicine

## 2023-10-30 VITALS — BP 102/70 | HR 79 | Ht 64.0 in | Wt 246.4 lb

## 2023-10-30 DIAGNOSIS — Z0130 Encounter for examination of blood pressure without abnormal findings: Secondary | ICD-10-CM

## 2023-10-30 DIAGNOSIS — E782 Mixed hyperlipidemia: Secondary | ICD-10-CM

## 2023-10-30 DIAGNOSIS — Z0000 Encounter for general adult medical examination without abnormal findings: Secondary | ICD-10-CM | POA: Diagnosis not present

## 2023-10-30 DIAGNOSIS — Z124 Encounter for screening for malignant neoplasm of cervix: Secondary | ICD-10-CM

## 2023-10-30 DIAGNOSIS — F411 Generalized anxiety disorder: Secondary | ICD-10-CM | POA: Diagnosis not present

## 2023-10-30 NOTE — Progress Notes (Signed)
Established Patient Office Visit  Subjective:  Patient ID: Cathy Trevino, female    DOB: 05-14-1986  Age: 37 y.o. MRN: [id number]  Chief Complaint  Patient presents with   Annual Exam    CPE with Pap    Patient is here for a PAP.  Her last Pap was 2019. She had BTL previously but needed IUD to control her menstrual cycles.  History of heavy menstrual bleeding caused anemia which has now resolved.  Labs were done recently, results discussed today, needs to ease off vitamin B12 supplement.  Patient generally feels well.    No other concerns at this time.   Past Medical History:  Diagnosis Date   Anemia    Anxiety    Asthma    Depression    Menorrhagia     Past Surgical History:  Procedure Laterality Date   CESAREAN SECTION  2012   TUBAL LIGATION      Social History   Socioeconomic History   Marital status: Divorced    Spouse name: Not on file   Number of children: 3   Years of education: Not on file   Highest education level: Some college, no degree  Occupational History   Not on file  Tobacco Use   Smoking status: Former    Current packs/day: 0.00    Average packs/day: 0.3 packs/day for 5.0 years (1.3 ttl pk-yrs)    Types: Cigarettes    Start date: 02/22/2011    Quit date: 02/22/2016    Years since quitting: 7.6   Smokeless tobacco: Never  Vaping Use   Vaping status: Never Used  Substance and Sexual Activity   Alcohol use: No   Drug use: No   Sexual activity: Yes    Birth control/protection: I.U.D.  Other Topics Concern   Not on file  Social History Narrative   Not on file   Social Determinants of Health   Financial Resource Strain: Not on file  Food Insecurity: Not on file  Transportation Needs: Not on file  Physical Activity: Sufficiently Active (11/04/2018)   Exercise Vital Sign    Days of Exercise per Week: 7 days    Minutes of Exercise per Session: 40 min  Stress: Not on file  Social Connections: Not on file  Intimate Partner Violence:  Not on file    Family History  Problem Relation Age of Onset   Heart failure Mother    Hypertension Mother    Alcohol abuse Father    Depression Father    Alcohol abuse Brother    Schizophrenia Brother    Autism spectrum disorder Son    Autism spectrum disorder Son    ADD / ADHD Daughter     Allergies  Allergen Reactions   Other     Seasonal allergies   Penicillins Rash    Review of Systems  Constitutional: Negative.  Negative for chills, diaphoresis, fever, malaise/fatigue and weight loss.  HENT:  Negative for congestion, ear pain, nosebleeds, sinus pain, sore throat and tinnitus.   Eyes: Negative.  Negative for blurred vision.  Respiratory:  Negative for cough, shortness of breath, wheezing and stridor.   Cardiovascular: Negative.  Negative for chest pain, palpitations and leg swelling.  Gastrointestinal: Negative.  Negative for abdominal pain, blood in stool, constipation, diarrhea, heartburn, melena, nausea and vomiting.  Genitourinary: Negative.  Negative for dysuria and flank pain.  Musculoskeletal: Negative.  Negative for joint pain and myalgias.  Skin: Negative.   Neurological: Negative.  Negative for dizziness,  tingling, tremors and headaches.  Endo/Heme/Allergies: Negative.   Psychiatric/Behavioral: Negative.  Negative for depression and suicidal ideas. The patient is not nervous/anxious and does not have insomnia.        Objective:   BP 102/70   Pulse 79   Ht 5\' 4"  (1.626 m)   Wt 246 lb 6.4 oz (111.8 kg)   SpO2 97%   BMI 42.29 kg/m   Vitals:   10/30/23 1115  BP: 102/70  Pulse: 79  Height: 5\' 4"  (1.626 m)  Weight: 246 lb 6.4 oz (111.8 kg)  SpO2: 97%  BMI (Calculated): 42.27    Physical Exam Vitals and nursing note reviewed. Exam conducted with a chaperone present.  Constitutional:      Appearance: Normal appearance.  HENT:     Head: Normocephalic and atraumatic.     Nose: Nose normal.     Mouth/Throat:     Mouth: Mucous membranes are  moist.     Pharynx: Oropharynx is clear.  Eyes:     Conjunctiva/sclera: Conjunctivae normal.     Pupils: Pupils are equal, round, and reactive to light.  Cardiovascular:     Rate and Rhythm: Normal rate and regular rhythm.     Pulses: Normal pulses.     Heart sounds: Normal heart sounds. No murmur heard. Pulmonary:     Effort: Pulmonary effort is normal.     Breath sounds: Normal breath sounds. No wheezing.  Chest:  Breasts:    Right: Normal. No swelling, bleeding, inverted nipple, mass, nipple discharge, skin change or tenderness.     Left: Normal. No swelling, bleeding, inverted nipple, mass, nipple discharge, skin change or tenderness.  Abdominal:     General: Bowel sounds are normal.     Palpations: Abdomen is soft.     Tenderness: There is no abdominal tenderness. There is no right CVA tenderness or left CVA tenderness.     Hernia: There is no hernia in the left inguinal area or right inguinal area.  Genitourinary:    General: Normal vulva.     Pubic Area: No rash or pubic lice.      Labia:        Right: No rash, tenderness, lesion or injury.        Left: No rash, tenderness, lesion or injury.      Urethra: No prolapse.     Vagina: Normal. No signs of injury and foreign body. No vaginal discharge, erythema, tenderness, bleeding, lesions or prolapsed vaginal walls.     Cervix: Normal.     Uterus: Normal.      Adnexa: Right adnexa normal and left adnexa normal.       Right: No mass, tenderness or fullness.         Left: No mass, tenderness or fullness.    Musculoskeletal:        General: Normal range of motion.     Cervical back: Normal range of motion.     Right lower leg: No edema.     Left lower leg: No edema.  Lymphadenopathy:     Upper Body:     Right upper body: No supraclavicular, axillary or pectoral adenopathy.     Left upper body: No supraclavicular, axillary or pectoral adenopathy.     Lower Body: No right inguinal adenopathy. No left inguinal adenopathy.   Skin:    General: Skin is warm and dry.  Neurological:     General: No focal deficit present.     Mental Status: She is  alert and oriented to person, place, and time.  Psychiatric:        Mood and Affect: Mood normal.        Behavior: Behavior normal.      No results found for any visits on 10/30/23.  Recent Results (from the past 2160 hour(s))  POCT Urinalysis Dipstick (52841)     Status: None   Collection Time: 08/13/23 10:15 AM  Result Value Ref Range   Color, UA orange    Clarity, UA cloudy    Glucose, UA Negative Negative   Bilirubin, UA neg    Ketones, UA neg    Spec Grav, UA 1.020 1.010 - 1.025   Blood, UA neg    pH, UA 6.0 5.0 - 8.0   Protein, UA Negative Negative   Urobilinogen, UA 0.2 0.2 or 1.0 E.U./dL   Nitrite, UA neg    Leukocytes, UA Negative Negative   Appearance cloudy    Odor yes   POCT Urinalysis Dipstick (32440)     Status: None   Collection Time: 08/21/23 11:40 AM  Result Value Ref Range   Color, UA yellow    Clarity, UA clear    Glucose, UA Negative Negative   Bilirubin, UA neg    Ketones, UA neg    Spec Grav, UA 1.020 1.010 - 1.025   Blood, UA neg    pH, UA 7.0 5.0 - 8.0   Protein, UA Negative Negative   Urobilinogen, UA 1.0 0.2 or 1.0 E.U./dL   Nitrite, UA neg    Leukocytes, UA Negative Negative   Appearance clear    Odor yes   CBC With Diff/Platelet     Status: None   Collection Time: 10/23/23  8:44 AM  Result Value Ref Range   WBC 6.6 3.4 - 10.8 x10E3/uL   RBC 4.44 3.77 - 5.28 x10E6/uL   Hemoglobin 13.3 11.1 - 15.9 g/dL   Hematocrit 10.2 72.5 - 46.6 %   MCV 92 79 - 97 fL   MCH 30.0 26.6 - 33.0 pg   MCHC 32.4 31.5 - 35.7 g/dL   RDW 36.6 44.0 - 34.7 %   Platelets 188 150 - 450 x10E3/uL   Neutrophils 67 Not Estab. %   Lymphs 23 Not Estab. %   Monocytes 7 Not Estab. %   Eos 2 Not Estab. %   Basos 1 Not Estab. %   Neutrophils Absolute 4.4 1.4 - 7.0 x10E3/uL   Lymphocytes Absolute 1.5 0.7 - 3.1 x10E3/uL   Monocytes Absolute 0.5  0.1 - 0.9 x10E3/uL   EOS (ABSOLUTE) 0.2 0.0 - 0.4 x10E3/uL   Basophils Absolute 0.1 0.0 - 0.2 x10E3/uL   Immature Granulocytes 0 Not Estab. %   Immature Grans (Abs) 0.0 0.0 - 0.1 x10E3/uL  CMP14+EGFR     Status: Abnormal   Collection Time: 10/23/23  8:44 AM  Result Value Ref Range   Glucose 90 70 - 99 mg/dL   BUN 10 6 - 20 mg/dL   Creatinine, Ser 4.25 0.57 - 1.00 mg/dL   eGFR 956 >38 VF/IEP/3.29   BUN/Creatinine Ratio 16 9 - 23   Sodium 144 134 - 144 mmol/L   Potassium 4.3 3.5 - 5.2 mmol/L   Chloride 108 (H) 96 - 106 mmol/L   CO2 23 20 - 29 mmol/L   Calcium 8.6 (L) 8.7 - 10.2 mg/dL   Total Protein 5.5 (L) 6.0 - 8.5 g/dL   Albumin 3.7 (L) 3.9 - 4.9 g/dL   Globulin, Total 1.8 1.5 -  4.5 g/dL   Bilirubin Total 0.4 0.0 - 1.2 mg/dL   Alkaline Phosphatase 42 (L) 44 - 121 IU/L   AST 12 0 - 40 IU/L   ALT 12 0 - 32 IU/L  Lipid Profile     Status: Abnormal   Collection Time: 10/23/23  8:44 AM  Result Value Ref Range   Cholesterol, Total 139 100 - 199 mg/dL   Triglycerides 161 0 - 149 mg/dL   HDL 37 (L) >09 mg/dL   VLDL Cholesterol Cal 21 5 - 40 mg/dL   LDL Chol Calc (NIH) 81 0 - 99 mg/dL   Chol/HDL Ratio 3.8 0.0 - 4.4 ratio    Comment:                                   T. Chol/HDL Ratio                                             Men  Women                               1/2 Avg.Risk  3.4    3.3                                   Avg.Risk  5.0    4.4                                2X Avg.Risk  9.6    7.1                                3X Avg.Risk 23.4   11.0   HgB A1c     Status: None   Collection Time: 10/23/23  8:44 AM  Result Value Ref Range   Hgb A1c MFr Bld 5.4 4.8 - 5.6 %    Comment:          Prediabetes: 5.7 - 6.4          Diabetes: >6.4          Glycemic control for adults with diabetes: <7.0    Est. average glucose Bld gHb Est-mCnc 108 mg/dL  TSH     Status: None   Collection Time: 10/23/23  8:44 AM  Result Value Ref Range   TSH 1.450 0.450 - 4.500 uIU/mL  Vitamin  B12     Status: Abnormal   Collection Time: 10/23/23  8:44 AM  Result Value Ref Range   Vitamin B-12 1,303 (H) 232 - 1,245 pg/mL      Assessment & Plan:  Patient advised to continue taking current medications except vitamin B12 supplement for now. Problem List Items Addressed This Visit     Hyperlipidemia   Anxiety disorder   Other Visit Diagnoses     Annual physical exam    -  Primary   Screening for malignant neoplasm of cervix          Follow up 3 months.  Total time spent: 30 minutes  Margaretann Loveless, MD  10/30/2023   This document may  have been prepared by [employer] and as such may include unintentional dictation errors.

## 2023-11-05 LAB — IGP, APTIMA HPV, RFX 16/18,45
HPV Aptima: NEGATIVE
PAP Smear Comment: 0

## 2023-11-05 LAB — SPECIMEN STATUS REPORT

## 2023-12-11 ENCOUNTER — Other Ambulatory Visit: Payer: Self-pay

## 2023-12-13 ENCOUNTER — Other Ambulatory Visit: Payer: Self-pay | Admitting: Cardiology

## 2023-12-17 ENCOUNTER — Other Ambulatory Visit: Payer: Self-pay

## 2023-12-17 MED ORDER — ERGOCALCIFEROL 1.25 MG (50000 UT) PO CAPS: 50000.0000 [IU] | ORAL_CAPSULE | ORAL | 2 refills | Status: DC

## 2023-12-17 MED ORDER — FLUTICASONE PROPIONATE 50 MCG/ACT NA SUSP: 1.0000 | Freq: Every day | NASAL | 4 refills | Status: DC

## 2023-12-17 MED ORDER — EPINEPHRINE 0.3 MG/0.3ML IJ SOAJ: 0.3000 mg | INTRAMUSCULAR | 0 refills | Status: DC

## 2024-01-24 ENCOUNTER — Other Ambulatory Visit: Payer: Medicaid Other

## 2024-01-24 DIAGNOSIS — E782 Mixed hyperlipidemia: Secondary | ICD-10-CM

## 2024-01-24 DIAGNOSIS — I10 Essential (primary) hypertension: Secondary | ICD-10-CM

## 2024-01-24 DIAGNOSIS — Z131 Encounter for screening for diabetes mellitus: Secondary | ICD-10-CM

## 2024-01-25 LAB — CMP14+EGFR
ALT: 13 [IU]/L (ref 0–32)
AST: 13 [IU]/L (ref 0–40)
Albumin: 3.5 g/dL — ABNORMAL LOW (ref 3.9–4.9)
Alkaline Phosphatase: 48 [IU]/L (ref 44–121)
BUN/Creatinine Ratio: 15 (ref 9–23)
BUN: 9 mg/dL (ref 6–20)
Bilirubin Total: 0.3 mg/dL (ref 0.0–1.2)
CO2: 26 mmol/L (ref 20–29)
Calcium: 8.5 mg/dL — ABNORMAL LOW (ref 8.7–10.2)
Chloride: 103 mmol/L (ref 96–106)
Creatinine, Ser: 0.61 mg/dL (ref 0.57–1.00)
Globulin, Total: 1.6 g/dL (ref 1.5–4.5)
Glucose: 84 mg/dL (ref 70–99)
Potassium: 4.1 mmol/L (ref 3.5–5.2)
Sodium: 140 mmol/L (ref 134–144)
Total Protein: 5.1 g/dL — ABNORMAL LOW (ref 6.0–8.5)
eGFR: 117 mL/min/{1.73_m2} (ref >59)

## 2024-01-25 LAB — LIPID PANEL
Chol/HDL Ratio: 3.7 {ratio} (ref 0.0–4.4)
Cholesterol, Total: 118 mg/dL (ref 100–199)
HDL: 32 mg/dL — ABNORMAL LOW (ref >39)
LDL Chol Calc (NIH): 64 mg/dL (ref 0–99)
Triglycerides: 121 mg/dL (ref 0–149)
VLDL Cholesterol Cal: 22 mg/dL (ref 5–40)

## 2024-01-25 LAB — HEMOGLOBIN A1C
Est. average glucose Bld gHb Est-mCnc: 103 mg/dL
Hgb A1c MFr Bld: 5.2 % (ref 4.8–5.6)

## 2024-01-25 LAB — TSH: TSH: 1.11 u[IU]/mL (ref 0.450–4.500)

## 2024-01-30 ENCOUNTER — Encounter: Payer: Self-pay | Admitting: Cardiology

## 2024-01-30 ENCOUNTER — Ambulatory Visit: Payer: Medicaid Other | Admitting: Cardiology

## 2024-01-30 VITALS — BP 114/76 | HR 73 | Ht 64.0 in | Wt 247.6 lb

## 2024-01-30 DIAGNOSIS — E559 Vitamin D deficiency, unspecified: Secondary | ICD-10-CM

## 2024-01-30 DIAGNOSIS — E782 Mixed hyperlipidemia: Secondary | ICD-10-CM

## 2024-01-30 DIAGNOSIS — F411 Generalized anxiety disorder: Secondary | ICD-10-CM | POA: Diagnosis not present

## 2024-01-30 DIAGNOSIS — Z0130 Encounter for examination of blood pressure without abnormal findings: Secondary | ICD-10-CM

## 2024-01-30 MED ORDER — HYDROXYZINE PAMOATE 25 MG PO CAPS: 25.0000 mg | ORAL_CAPSULE | Freq: Two times a day (BID) | ORAL | 1 refills | Status: AC | PRN

## 2024-01-30 MED ORDER — CETIRIZINE HCL 10 MG PO TABS: 10.0000 mg | ORAL_TABLET | Freq: Every day | ORAL | 1 refills | Status: AC

## 2024-01-30 MED ORDER — FLUTICASONE PROPIONATE 50 MCG/ACT NA SUSP: 1.0000 | Freq: Every day | NASAL | 4 refills | Status: AC

## 2024-01-30 MED ORDER — NYSTATIN 100000 UNIT/GM EX POWD: 1.0000 | Freq: Three times a day (TID) | CUTANEOUS | 3 refills | Status: AC

## 2024-01-30 MED ORDER — CITALOPRAM HYDROBROMIDE 10 MG PO TABS: 10.0000 mg | ORAL_TABLET | Freq: Every day | ORAL | 1 refills | Status: DC

## 2024-01-30 MED ORDER — ERGOCALCIFEROL 1.25 MG (50000 UT) PO CAPS: 50000.0000 [IU] | ORAL_CAPSULE | ORAL | 2 refills | Status: DC

## 2024-01-30 MED ORDER — ROSUVASTATIN CALCIUM 20 MG PO TABS: 20.0000 mg | ORAL_TABLET | Freq: Every day | ORAL | 1 refills | Status: DC

## 2024-01-30 MED ORDER — MELOXICAM 15 MG PO TABS: 15.0000 mg | ORAL_TABLET | Freq: Every day | ORAL | 1 refills | Status: DC

## 2024-01-30 NOTE — Progress Notes (Signed)
Established Patient Office Visit  Subjective:  Patient ID: Cathy Trevino, female    DOB: Oct 03, 1986  Age: 38 y.o. MRN: [id number]  Chief Complaint  Patient presents with   Follow-up    3 month follow up    Patient in office for 3 month follow up, discuss recent lab results. Patient reports feeling well, no complaints today.  Discussed recent lab work. LDL at goal. Hgb A1c normal.  Patient up to date on health maintenance exams.     No other concerns at this time.   Past Medical History:  Diagnosis Date   Anemia    Anxiety    Asthma    Depression    Menorrhagia     Past Surgical History:  Procedure Laterality Date   CESAREAN SECTION  2012   TUBAL LIGATION      Social History   Socioeconomic History   Marital status: Divorced    Spouse name: Not on file   Number of children: 3   Years of education: Not on file   Highest education level: Some college, no degree  Occupational History   Not on file  Tobacco Use   Smoking status: Former    Current packs/day: 0.00    Average packs/day: 0.3 packs/day for 5.0 years (1.3 ttl pk-yrs)    Types: Cigarettes    Start date: 02/22/2011    Quit date: 02/22/2016    Years since quitting: 7.9   Smokeless tobacco: Never  Vaping Use   Vaping status: Never Used  Substance and Sexual Activity   Alcohol use: No   Drug use: No   Sexual activity: Yes    Birth control/protection: I.U.D.  Other Topics Concern   Not on file  Social History Narrative   Not on file   Social Drivers of Health   Financial Resource Strain: Not on file  Food Insecurity: Not on file  Transportation Needs: Not on file  Physical Activity: Sufficiently Active (11/04/2018)   Exercise Vital Sign    Days of Exercise per Week: 7 days    Minutes of Exercise per Session: 40 min  Stress: Not on file  Social Connections: Not on file  Intimate Partner Violence: Not on file    Family History  Problem Relation Age of Onset   Heart failure Mother     Hypertension Mother    COPD Mother    Alcohol abuse Father    Depression Father    COPD Father    Alcohol abuse Brother    Schizophrenia Brother    ADD / ADHD Daughter    Autism spectrum disorder Son    Autism spectrum disorder Son     Allergies  Allergen Reactions   Other     Seasonal allergies   Penicillins Rash    Outpatient Medications Prior to Visit  Medication Sig   acetaminophen (TYLENOL) 500 MG tablet Take 500 mg by mouth every 6 (six) hours as needed.   ascorbic acid (VITAMIN C) 500 MG tablet Take 500 mg by mouth every other day.   Multiple Vitamin (MULTIVITAMIN ADULT PO) Take 1 tablet by mouth daily at 8 pm.   vitamin B-12 (CYANOCOBALAMIN) 100 MCG tablet Take 100 mcg by mouth daily.   [DISCONTINUED] cetirizine (ZYRTEC) 10 MG tablet Cetirizine HCl 10 MG Oral Tablet QTY: 30 tablet Days: 30 Refills: 3  Written: 02/10/20 Patient Instructions: Take 1 tablet by mouth nightly at bedtime as needed for seasonal allergies   [DISCONTINUED] citalopram (CELEXA) 10 MG tablet  Take 1 tablet (10 mg total) by mouth daily.   [DISCONTINUED] ergocalciferol (VITAMIN D2) 1.25 MG (50000 UT) capsule Take 1 capsule (50,000 Units total) by mouth once a week.   [DISCONTINUED] fluticasone (FLONASE) 50 MCG/ACT nasal spray Place 1 spray into both nostrils daily.   [DISCONTINUED] hydrOXYzine (VISTARIL) 25 MG capsule Take 1 capsule (25 mg total) by mouth 2 (two) times daily as needed for anxiety.   [DISCONTINUED] meloxicam (MOBIC) 15 MG tablet Take 1 tablet (15 mg total) by mouth daily.   [DISCONTINUED] nystatin (MYCOSTATIN/NYSTOP) powder Apply 1 Application topically 3 (three) times daily.   [DISCONTINUED] rosuvastatin (CRESTOR) 20 MG tablet Take 1 tablet (20 mg total) by mouth at bedtime.   EPINEPHrine 0.3 mg/0.3 mL IJ SOAJ injection Inject 0.3 mg into the muscle See admin instructions. (Patient not taking: Reported on 01/30/2024)   No facility-administered medications prior to visit.    Review of  Systems  Constitutional: Negative.   HENT: Negative.    Eyes: Negative.   Respiratory: Negative.  Negative for shortness of breath.   Cardiovascular: Negative.  Negative for chest pain.  Gastrointestinal: Negative.  Negative for abdominal pain, constipation and diarrhea.  Genitourinary: Negative.   Musculoskeletal:  Negative for joint pain and myalgias.  Skin: Negative.   Neurological: Negative.  Negative for dizziness and headaches.  Endo/Heme/Allergies: Negative.   All other systems reviewed and are negative.      Objective:   BP 114/76   Pulse 73   Ht 5\' 4"  (1.626 m)   Wt 247 lb 9.6 oz (112.3 kg)   SpO2 99%   BMI 42.50 kg/m   Vitals:   01/30/24 1003  BP: 114/76  Pulse: 73  Height: 5\' 4"  (1.626 m)  Weight: 247 lb 9.6 oz (112.3 kg)  SpO2: 99%  BMI (Calculated): 42.48    Physical Exam Vitals and nursing note reviewed.  Constitutional:      Appearance: Normal appearance. She is normal weight.  HENT:     Head: Normocephalic and atraumatic.     Nose: Nose normal.     Mouth/Throat:     Mouth: Mucous membranes are moist.  Eyes:     Extraocular Movements: Extraocular movements intact.     Conjunctiva/sclera: Conjunctivae normal.     Pupils: Pupils are equal, round, and reactive to light.  Cardiovascular:     Rate and Rhythm: Normal rate and regular rhythm.     Pulses: Normal pulses.     Heart sounds: Normal heart sounds.  Pulmonary:     Effort: Pulmonary effort is normal.     Breath sounds: Normal breath sounds.  Abdominal:     General: Abdomen is flat. Bowel sounds are normal.     Palpations: Abdomen is soft.  Musculoskeletal:        General: Normal range of motion.     Cervical back: Normal range of motion.  Skin:    General: Skin is warm and dry.  Neurological:     General: No focal deficit present.     Mental Status: She is alert and oriented to person, place, and time.  Psychiatric:        Mood and Affect: Mood normal.        Behavior: Behavior  normal.        Thought Content: Thought content normal.        Judgment: Judgment normal.      No results found for any visits on 01/30/24.  Recent Results (from the past 2160 hours)  Hemoglobin A1c     Status: None   Collection Time: 01/24/24 10:26 AM  Result Value Ref Range   Hgb A1c MFr Bld 5.2 4.8 - 5.6 %    Comment:          Prediabetes: 5.7 - 6.4          Diabetes: >6.4          Glycemic control for adults with diabetes: <7.0    Est. average glucose Bld gHb Est-mCnc 103 mg/dL  TSH     Status: None   Collection Time: 01/24/24 10:26 AM  Result Value Ref Range   TSH 1.110 0.450 - 4.500 uIU/mL  CMP14+EGFR     Status: Abnormal   Collection Time: 01/24/24 10:26 AM  Result Value Ref Range   Glucose 84 70 - 99 mg/dL   BUN 9 6 - 20 mg/dL   Creatinine, Ser 1.30 0.57 - 1.00 mg/dL   eGFR 865 >78 IO/NGE/9.52   BUN/Creatinine Ratio 15 9 - 23   Sodium 140 134 - 144 mmol/L   Potassium 4.1 3.5 - 5.2 mmol/L   Chloride 103 96 - 106 mmol/L   CO2 26 20 - 29 mmol/L   Calcium 8.5 (L) 8.7 - 10.2 mg/dL   Total Protein 5.1 (L) 6.0 - 8.5 g/dL   Albumin 3.5 (L) 3.9 - 4.9 g/dL   Globulin, Total 1.6 1.5 - 4.5 g/dL   Bilirubin Total 0.3 0.0 - 1.2 mg/dL   Alkaline Phosphatase 48 44 - 121 IU/L   AST 13 0 - 40 IU/L   ALT 13 0 - 32 IU/L  Lipid panel     Status: Abnormal   Collection Time: 01/24/24 10:26 AM  Result Value Ref Range   Cholesterol, Total 118 100 - 199 mg/dL   Triglycerides 841 0 - 149 mg/dL   HDL 32 (L) >32 mg/dL   VLDL Cholesterol Cal 22 5 - 40 mg/dL   LDL Chol Calc (NIH) 64 0 - 99 mg/dL   Chol/HDL Ratio 3.7 0.0 - 4.4 ratio    Comment:                                   T. Chol/HDL Ratio                                             Men  Women                               1/2 Avg.Risk  3.4    3.3                                   Avg.Risk  5.0    4.4                                2X Avg.Risk  9.6    7.1                                3X Avg.Risk 23.4   11.0  Assessment & Plan:  Continue same medications.   Problem List Items Addressed This Visit       Other   Hyperlipidemia - Primary   Relevant Medications   rosuvastatin (CRESTOR) 20 MG tablet   Vitamin D deficiency   Anxiety disorder   Relevant Medications   citalopram (CELEXA) 10 MG tablet   hydrOXYzine (VISTARIL) 25 MG capsule    Return in about 4 months (around 05/29/2024) for with fasting lab priors.   Total time spent: 25 minutes  [employer], NP  01/30/2024   This document may have been prepared by Dragon Voice Recognition software and as such may include unintentional dictation errors.

## 2024-02-26 ENCOUNTER — Institutional Professional Consult (permissible substitution): Payer: Medicaid Other | Admitting: Cardiovascular Disease

## 2024-05-14 ENCOUNTER — Ambulatory Visit: Admitting: Cardiology

## 2024-05-14 ENCOUNTER — Encounter: Payer: Self-pay | Admitting: Cardiology

## 2024-05-14 VITALS — BP 115/80 | HR 115 | Ht 64.0 in | Wt 250.4 lb

## 2024-05-14 DIAGNOSIS — R1010 Upper abdominal pain, unspecified: Secondary | ICD-10-CM

## 2024-05-14 DIAGNOSIS — M25552 Pain in left hip: Secondary | ICD-10-CM | POA: Diagnosis not present

## 2024-05-14 DIAGNOSIS — Z0130 Encounter for examination of blood pressure without abnormal findings: Secondary | ICD-10-CM

## 2024-05-14 MED ORDER — PANTOPRAZOLE SODIUM 20 MG PO TBEC: 20.0000 mg | DELAYED_RELEASE_TABLET | Freq: Every day | ORAL | 1 refills | Status: DC

## 2024-05-14 NOTE — Progress Notes (Addendum)
 Established Patient Office Visit  Subjective:  Patient ID: Cathy Trevino, female    DOB: July 02, 1986  Age: 38 y.o. MRN: [id number]  Chief Complaint  Patient presents with   Acute Visit    Stomach like upper abdomen pain    Patient in office for an acute visit, complaining of upper abdominal pain. Also complaining of left hip pain.  Patient reports early satiety. Then feels hungry soon after. When she lays down, feels nauseated. Pain is epigastric area, pain also on palpation. Will order an ultrasound to rule out hernia. Will send in Protonix for possible acid reflux.  Patient had lumbar xray 08/2023, Degenerative disc disease noted with disc space narrowing and marginal osteophytes. Patient using Tylenol  with minimal relief. Will order PT.    No other concerns at this time.   Past Medical History:  Diagnosis Date   Anemia    Anxiety    Asthma    Depression    Menorrhagia     Past Surgical History:  Procedure Laterality Date   CESAREAN SECTION  2012   TUBAL LIGATION      Social History   Socioeconomic History   Marital status: Divorced    Spouse name: Not on file   Number of children: 3   Years of education: Not on file   Highest education level: Some college, no degree  Occupational History   Not on file  Tobacco Use   Smoking status: Former    Current packs/day: 0.00    Average packs/day: 0.3 packs/day for 5.0 years (1.3 ttl pk-yrs)    Types: Cigarettes    Start date: 02/22/2011    Quit date: 02/22/2016    Years since quitting: 8.2   Smokeless tobacco: Never  Vaping Use   Vaping status: Never Used  Substance and Sexual Activity   Alcohol use: No   Drug use: No   Sexual activity: Yes    Birth control/protection: I.U.D.  Other Topics Concern   Not on file  Social History Narrative   Not on file   Social Drivers of Health   Financial Resource Strain: Not on file  Food Insecurity: Not on file  Transportation Needs: Not on file  Physical Activity:  Sufficiently Active (11/04/2018)   Exercise Vital Sign    Days of Exercise per Week: 7 days    Minutes of Exercise per Session: 40 min  Stress: Not on file  Social Connections: Not on file  Intimate Partner Violence: Not on file    Family History  Problem Relation Age of Onset   Heart failure Mother    Hypertension Mother    COPD Mother    Alcohol abuse Father    Depression Father    COPD Father    Alcohol abuse Brother    Schizophrenia Brother    ADD / ADHD Daughter    Autism spectrum disorder Son    Autism spectrum disorder Son     Allergies  Allergen Reactions   Other     Seasonal allergies   Penicillins Rash    Outpatient Medications Prior to Visit  Medication Sig   acetaminophen  (TYLENOL ) 500 MG tablet Take 500 mg by mouth every 6 (six) hours as needed.   ascorbic acid (VITAMIN C) 500 MG tablet Take 500 mg by mouth every other day.   cetirizine  (ZYRTEC ) 10 MG tablet Take 1 tablet (10 mg total) by mouth at bedtime.   citalopram  (CELEXA ) 10 MG tablet Take 1 tablet (10 mg total) by  mouth daily.   ergocalciferol  (VITAMIN D2) 1.25 MG (50000 UT) capsule Take 1 capsule (50,000 Units total) by mouth once a week.   fluticasone  (FLONASE ) 50 MCG/ACT nasal spray Place 1 spray into both nostrils daily.   hydrOXYzine  (VISTARIL ) 25 MG capsule Take 1 capsule (25 mg total) by mouth 2 (two) times daily as needed for anxiety.   meloxicam  (MOBIC ) 15 MG tablet Take 1 tablet (15 mg total) by mouth daily.   Multiple Vitamin (MULTIVITAMIN ADULT PO) Take 1 tablet by mouth daily at 8 pm.   nystatin  (MYCOSTATIN /NYSTOP ) powder Apply 1 Application topically 3 (three) times daily.   rosuvastatin  (CRESTOR ) 20 MG tablet Take 1 tablet (20 mg total) by mouth at bedtime.   vitamin B-12 (CYANOCOBALAMIN) 100 MCG tablet Take 100 mcg by mouth daily.   [DISCONTINUED] EPINEPHrine  0.3 mg/0.3 mL IJ SOAJ injection Inject 0.3 mg into the muscle See admin instructions. (Patient not taking: Reported on 05/14/2024)    No facility-administered medications prior to visit.    Review of Systems  Constitutional: Negative.   HENT: Negative.    Eyes: Negative.   Respiratory: Negative.  Negative for shortness of breath.   Cardiovascular: Negative.  Negative for chest pain.  Gastrointestinal:  Positive for nausea. Negative for abdominal pain, blood in stool, constipation, diarrhea and vomiting.  Genitourinary: Negative.   Musculoskeletal:  Positive for joint pain. Negative for myalgias.  Skin: Negative.   Neurological: Negative.  Negative for dizziness and headaches.  Endo/Heme/Allergies: Negative.   All other systems reviewed and are negative.      Objective:   BP 115/80   Pulse (!) 115   Ht 5\' 4"  (1.626 m)   Wt 250 lb 6.4 oz (113.6 kg)   SpO2 98%   BMI 42.98 kg/m   Vitals:   05/14/24 1040  BP: 115/80  Pulse: (!) 115  Height: 5\' 4"  (1.626 m)  Weight: 250 lb 6.4 oz (113.6 kg)  SpO2: 98%  BMI (Calculated): 42.96    Physical Exam Vitals and nursing note reviewed.  Constitutional:      Appearance: Normal appearance. She is normal weight.  HENT:     Head: Normocephalic and atraumatic.     Nose: Nose normal.     Mouth/Throat:     Mouth: Mucous membranes are moist.  Eyes:     Extraocular Movements: Extraocular movements intact.     Conjunctiva/sclera: Conjunctivae normal.     Pupils: Pupils are equal, round, and reactive to light.  Cardiovascular:     Rate and Rhythm: Normal rate and regular rhythm.     Pulses: Normal pulses.     Heart sounds: Normal heart sounds.  Pulmonary:     Effort: Pulmonary effort is normal.     Breath sounds: Normal breath sounds.  Abdominal:     General: Abdomen is flat. Bowel sounds are normal.     Palpations: Abdomen is soft.  Musculoskeletal:        General: Normal range of motion.     Cervical back: Normal range of motion.  Skin:    General: Skin is warm and dry.  Neurological:     General: No focal deficit present.     Mental Status: She is  alert and oriented to person, place, and time.  Psychiatric:        Mood and Affect: Mood normal.        Behavior: Behavior normal.        Thought Content: Thought content normal.  Judgment: Judgment normal.      No results found for any visits on 05/14/24.  No results found for this or any previous visit (from the past 2160 hours).    Assessment & Plan:  Ultrasound to rule out hernia. Protonxi for acid reflux. PT for hip pain.  Problem List Items Addressed This Visit       Other   Pain of upper abdomen - Primary   Relevant Orders   US  Abdomen Limited   Left hip pain   Relevant Orders   Ambulatory referral to Physical Therapy    Return in about 2 weeks (around 05/28/2024) for as scheduled.   Total time spent: 25 minutes  [employer], NP  05/14/2024   This document may have been prepared by Dragon Voice Recognition software and as such may include unintentional dictation errors.

## 2024-05-15 ENCOUNTER — Ambulatory Visit: Payer: Self-pay | Admitting: Cardiology

## 2024-05-15 ENCOUNTER — Encounter: Payer: Self-pay | Admitting: Oncology

## 2024-05-15 ENCOUNTER — Ambulatory Visit
Admission: RE | Admit: 2024-05-15 | Discharge: 2024-05-15 | Disposition: A | Discharge status: Home / Self Care | Source: Ambulatory Visit | Attending: Cardiology | Admitting: Cardiology

## 2024-05-15 DIAGNOSIS — R1010 Upper abdominal pain, unspecified: Secondary | ICD-10-CM

## 2024-05-20 ENCOUNTER — Encounter: Payer: Self-pay | Admitting: Physical Therapy

## 2024-05-20 ENCOUNTER — Ambulatory Visit: Attending: Cardiology | Admitting: Physical Therapy

## 2024-05-20 DIAGNOSIS — M25552 Pain in left hip: Secondary | ICD-10-CM | POA: Diagnosis present

## 2024-05-20 DIAGNOSIS — R2689 Other abnormalities of gait and mobility: Secondary | ICD-10-CM | POA: Insufficient documentation

## 2024-05-20 DIAGNOSIS — M5459 Other low back pain: Secondary | ICD-10-CM | POA: Diagnosis present

## 2024-05-20 NOTE — Therapy (Unsigned)
 OUTPATIENT PHYSICAL THERAPY EVALUATION   Patient Name: Cathy Trevino MRN: [id number] DOB:[birth year], 38 y.o., female Today's Date: 05/20/2024  END OF SESSION:  PT End of Session - 05/20/24 2010     Visit Number 1    Number of Visits 17    Date for PT Re-Evaluation 08/12/24    Authorization Type [place] [employer]ICA[employer] reporting period from 05/20/2024    PT Start Time 1300    PT Stop Time 1346    PT Time Calculation (min) 46 min    Activity Tolerance Patient tolerated treatment well    Behavior During Therapy WFL for tasks assessed/performed             Past Medical History:  Diagnosis Date   Anemia    Anxiety    Asthma    Depression    Menorrhagia    Past Surgical History:  Procedure Laterality Date   CESAREAN SECTION  2012   TUBAL LIGATION     Patient Active Problem List   Diagnosis Date Noted   Pain of upper abdomen 05/14/2024   Left hip pain 05/14/2024   MDD (major depressive disorder), recurrent episode, moderate (HCC) 10/02/2022   Current moderate episode of major depressive disorder without prior episode (HCC) 09/06/2022   Bereavement 09/06/2022   Anxiety disorder 09/06/2022   Osteoarthritis of facet joint of lumbar spine 04/13/2021   Spondylosis without myelopathy or radiculopathy, lumbosacral region 04/13/2021   Abnormal MRI, lumbar spine (07/15/2018) 03/09/2021   Chronic low back pain (1ry area of Pain) (Bilateral) (R>L) w/o sciatica 03/09/2021   Lumbosacral facet syndrome (Bilateral) 03/09/2021   Chronic sacroiliac joint pain (Bilateral) (R>L) 03/09/2021   DDD (degenerative disc disease), lumbosacral 03/09/2021   Morbid obesity with BMI of 40.0-44.9, adult (HCC) 03/09/2021   Levoscoliosis of lumbar spine 03/09/2021   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) 03/09/2021   Allergic rhinitis due to animal (cat) (dog) hair and dander 01/30/2021   Allergic rhinitis due to pollen 01/30/2021   Rash and other nonspecific skin eruption  01/30/2021   Chronic pain syndrome 01/30/2021   Pharmacologic therapy 01/30/2021   Disorder of skeletal system 01/30/2021   Problems influencing health status 01/30/2021   Varicose veins of leg with pain, bilateral 08/05/2019   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) 10/17/2018   Hyperlipidemia 10/17/2018   Other B-complex deficiencies 05/23/2018   Vitamin D  deficiency 05/23/2018   Menorrhagia with regular cycle 03/11/2018   Microcytic anemia 02/25/2018   Iron deficiency anemia due to chronic blood loss 02/18/2018   Chest pain 12/21/2015   Obesity 11/25/2014   Allergic rhinitis 05/21/2014   Dermatitis 10/23/2013   Other headache syndrome 06/11/2013    PCP: Alica Antu, NP  REFERRING PROVIDER: Alica Antu, NP  REFERRING DIAG: left hip pain  THERAPY DIAG:  Other low back pain  Pain in left hip  Other abnormalities of gait and mobility  Rationale for Evaluation and Treatment: Rehabilitation  ONSET DATE: approximately 05/06/2024  SUBJECTIVE:   SUBJECTIVE STATEMENT: Patient states she has chronic back pain, but the left hip has been bothering her for about 2 weeks now. It limites her mobility so she cannot sit or stand for long periods of time and bending over is a "no go." She states the pain comes and goes and is not sharp or dull. It does not radiate and is not in her groin. Pain has stayed the same since it started. There was no apparent injury.   Does  not like to take tyelonl or ibuprofen  (tylenol  rec by doctor). No injections or chiropractic.    PERTINENT HISTORY: Patient is a 38 y.o. female who presents to outpatient physical therapy with a referral for medical diagnosis left hip pain. This patient's chief complaints consist of pain at the left iliac crest, leading to the following functional deficits: sitting, standing, squatting, bending, stairs, walking over 15 minutes, reading, sleeping, caring for household. Relevant past  medical history and comorbidities include the following: she has Iron deficiency anemia due to chronic blood loss; Microcytic anemia; Menorrhagia with regular cycle; Varicose veins of leg with pain, bilateral; Allergic rhinitis due to animal (cat) (dog) hair and dander; Allergic rhinitis due to pollen; Chest pain; Dermatitis; 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); Hyperlipidemia; Obesity; Other B-complex deficiencies; Other headache syndrome; Rash and other nonspecific skin eruption; Vitamin D  deficiency; Chronic pain syndrome; Disorder of skeletal system; Abnormal MRI, lumbar spine (07/15/2018); Chronic low back pain (1ry area of Pain) (Bilateral) (R>L) w/o sciatica; Lumbosacral facet syndrome (Bilateral); Chronic sacroiliac joint pain (Bilateral) (R>L); DDD (degenerative disc disease), lumbosacral; Morbid obesity with BMI of 40.0-44.9, adult (HCC); Levoscoliosis of lumbar spine; 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 she  has a past medical history of Anemia, Anxiety, Asthma, Depression, and Menorrhagia. she  has a past surgical history that includes Cesarean section (2012); plantar faciitis surgery R, and Tubal ligation. Patient denies hx of cancer, stroke, seizures, lung problems, heart problems, diabetes, unexplained weight loss, unexplained changes in bowel or bladder problems, unexplained stumbling or dropping things, osteoporosis, and spinal surgery  Exercise history: She has an exercise ball and will sit on it for 10-15 minutes a day. Walks the neighborhood with dogs 3x a day for 10 minutes.   PAIN: Are  you having pain? Yes NPRS: Current: 2/10,  Best: 2/10, Worst: 8/10. Pain location: left iliac crest region, also has chronic pain in the low back central and pain in the anterior thighs (both sides equally painful). Denies symptom below knees. Paresthesia sometimes over the thighs.  Pain description: not sharp or dull, but "noticeable" Aggravating factors: sitting, standing, squatting, bending, stairs, walking, lying on left side Relieving factors: changing positions, heating pad,   FUNCTIONAL LIMITATIONS: sitting, standing, squatting, bending, stairs, walking over 15 minutes, reading, sleeping, caring for household.   LEISURE: reading  PRECAUTIONS: None  WEIGHT BEARING RESTRICTIONS: No  FALLS:  Has patient fallen in last 6 months? 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   FINDINGS: No fracture, dislocation or subluxation. No spondylolisthesis. No osteolytic or osteoblastic changes. There is an IUD.   Degenerative disc disease noted with disc space narrowing and marginal osteophytes at L5-S1.   IMPRESSION: Degenerative changes. No acute osseous abnormalities.  Lumbar spine MRI from 07/15/2018:  CLINICAL DATA:  Initial evaluation for central low back pain for 2 months, worsening.  EXAM: MRI LUMBAR SPINE WITHOUT AND WITH CONTRAST   TECHNIQUE: Multiplanar and multiecho pulse sequences of the lumbar spine were obtained without and with intravenous contrast.   CONTRAST:  20mL MULTIHANCE  GADOBENATE DIMEGLUMINE  529 MG/ML IV SOLN   COMPARISON:  None available.   FINDINGS: Segmentation: Normal segmentation. Lowest well-formed disc  labeled the L5-S1 level.   Alignment: Mild levoscoliosis. Vertebral bodies otherwise normally aligned with preservation of the normal lumbar lordosis.   Vertebrae: Vertebral body heights maintained without evidence for acute or chronic fracture. Bone marrow signal intensity diffusely decreased on T1 weighted imaging, most commonly related to anemia, smoking, or obesity. No discrete or worrisome osseous lesions. No abnormal marrow edema or enhancement.   Conus medullaris and cauda equina: Conus extends to the L1 level. Conus and cauda equina appear normal.   Paraspinal and other soft tissues: Paraspinous soft tissues within normal limits. Visualized visceral structures unremarkable.   Disc levels:   No significant findings are seen through the L4-5 level.   L5-S1: Disc desiccation. Broad central/left subarticular disc protrusion extends into the left lateral recess, impinging upon the descending left S1 nerve root which is slightly displaced posteriorly (series 5, image 37). Moderate left lateral recess stenosis. Central canal remains widely patent. No significant foraminal encroachment.   IMPRESSION: 1. 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 2. Diffusely decreased T1 signal throughout the visualized bone marrow, nonspecific, but suspected to be related to patient's history of anemia. 3. Otherwise normal MRI of the lumbar spine.    SELF-REPORTED FUNCTION Lower Extremity Functional Scale (LEFS): 32 (range 0-80)  OBSERVATION/INSPECTION Posture Posture (standing): bilateral genuvarus Anthropometrics Tremor: none Body composition: obese Functional Mobility Bed mobility: supine <> sit and rolling mod I for increased time/effort Transfers: sit <> stand I Gait: grossly WFL for household and short community ambulation. More detailed gait analysis deferred to later date as  needed.   SPINE MOTION  LUMBAR SPINE AROM *Indicates pain Flexion: fingers in front of toes, increased L hip pain, slow return, no worse  Extension: 75% no hip pain Side Flexion:   R finger to knee joint line, mild increase in concordant pain +  L finger to knee joint line, increased concordant pain ++ Rotation:  R WFL with back pain L 50% increased concordant hip pain++ "feels like a bee sting"  PERIPHERAL JOINT MOTION (in degrees) B LE grossly WFL for basic mobility per observation.   PASSIVE RANGE OF MOTION (PROM) Hip PROM grossly assessed.  All PROM with R hip uncomfortable with guarded end feel but motion grossly [facility] for basic mobilty.   MUSCLE PERFORMANCE (MMT):  *Indicates pain 05/21/24 Date Date  Joint/Motion R/L R/L R/L  Hip     Flexion (L1, L2) 4/5 / /  Extension (knee ext) 4+/4 / /  Extension (knee flex) / / /  Abduction 5/4* / /  Adduction / / /  External rotation 4/4 / /  Internal rotation  3/3 / /  Comments:   SPECIAL TESTS:  LOWER LIMB NEURODYNAMIC TESTS Straight Leg Raise (Sciatic nerve) R  = positive for concordant pain, sensitive to foot position L  = negative  HIP SPECIAL TESTS FADDIER: R = negative, L = negative for groin pain.  FABER: R = negative, L = lateral pain. LAD through hip: L  = slight decrease in pain Hip Derotation test: L = negative  ACCESSORY MOTION: Concordant pain to L UPA at  L2-5  PALPATION: TTP with concordant pain at L QL and lumbar paraspinals NOT TTP over L glute med or greater trochanter  FUNCTIONAL/BALANCE TESTS: SLS balance: R < 5 seconds, R >5 seconds (mild trendelenburg bilaterally)                                                                                                                                TREATMENT Therapeutic exercise: therapeutic exercises that incorporate ONE parameter at one or more areas of the body to centralize symptoms, develop strength and endurance, range of motion, and flexibility  required for successful completion of functional activities.  Hooklying diaphragmatic breathing Supine pelvic tilt AROM Good carry over after initial instruction Hooklying B hip abduction against Black TB  Education on HEP including handout   Education on diagnosis, prognosis, POC, anatomy and physiology of current condition.   Pt required multimodal cuing for proper technique and to facilitate improved neuromuscular control, strength, range of motion, and functional ability resulting in improved performance and form.  PATIENT EDUCATION:  Education details: Exercise purpose/form. Self management techniques. Education on diagnosis, prognosis, POC, anatomy and physiology of current condition. Education on HEP including handout. Person educated: Patient Education method: Explanation, Demonstration, and Handouts Education comprehension: verbalized understanding, returned demonstration, and needs further education  HOME EXERCISE PROGRAM: Access Code: [email or web address] URL: [email or web address] Date: 05/20/2024 Prepared by: Alleen Isle  Exercises - Supine Diaphragmatic Breathing  - 2 x daily - 2 sets - 10 breaths - Supine Pelvic Tilt  - 2 x daily - 1 sets - 20 reps - Hooklying Clamshell with Resistance  - 1 x daily - 1 sets - 20 reps - 5 seconds hold   ASSESSMENT:  CLINICAL IMPRESSION:  Patient is a 38 year old female referred to outpatient physical therapy with a medical diagnosis of left hip pain and presents with subacute onset (2 weeks) of left iliac crest/hip pain, superimposed on chronic low back pain with known history of L5-S1 disc protrusion and left S1 nerve root impingement. Current pain is localized to the left iliac crest and lumbar paraspinals, worsened by static positions and transitional movements.  Pain behavior and mechanical pattern suggest lumbar spine and QL muscle involvement, possibly aggravated by altered loading from deconditioning, obesity.   No signs  of true intra-articular hip pathology (FADDIR negative, FABER lateral pain), and no red flags were identified. Neurodynamic testing is unremarkable on the left.  Contributing Factors: Lumbar degenerative disc disease at L5-S1 with radicular potential Segmental hypomobility and myofascial dysfunction (QL, paraspinals) Chronic pain sensitization and sedentary lifestyle Obesity and postural adaptation (genu varus, Trendelenburg sign) Psychosocial stressors: depression, anxiety, caregiver burden  Specific Impairments: Pain with lumbar side flexion and rotation (L > R) Weakness: Left hip abductors, internal rotators Palpable tenderness: Left QL and paraspinals Limited SLS balance (likely related to motor control and pain inhibition) Low LEFS score (32/80) indicating moderate to severe functional limitations  Patient will benefit from skilled physical therapy intervention  to address current body structure impairments and activity limitations to improve function and work towards goals set in current POC in order to return to prior level of function or maximal functional improvement.    OBJECTIVE IMPAIRMENTS: decreased activity tolerance, decreased endurance, decreased knowledge of condition, decreased mobility, difficulty walking, decreased ROM, decreased strength, increased muscle spasms, impaired flexibility, improper body mechanics, postural dysfunction, obesity, and pain.   ACTIVITY LIMITATIONS: carrying, lifting, bending, sitting, standing, squatting, stairs, transfers, bed mobility, locomotion level, and caring for others  PARTICIPATION LIMITATIONS: cleaning, laundry, interpersonal relationship, shopping, community activity, and  sitting, standing, squatting, bending, stairs, walking over 15 minutes, reading, sleeping, caring for household  PERSONAL FACTORS: Past/current experiences and 3+ comorbidities:   are also affecting patient's functional outcome.   REHAB POTENTIAL: Good  CLINICAL  DECISION MAKING: Evolving/moderate complexity  EVALUATION COMPLEXITY: Moderate   GOALS: Goals reviewed with patient? No  SHORT TERM GOALS: Target date: 06/03/2024  Patient will be independent with initial home exercise program for self-management of symptoms. Baseline: Initial HEP provided at IE (05/20/24); Goal status: INITIAL  LONG TERM GOALS: Target date: 08/12/2024  Patient will be independent with a long-term home exercise program for self-management of symptoms.  Baseline: Initial HEP provided at IE (05/20/24); Goal status: INITIAL  2.  Patient will demonstrate improved Lower Extremity Functional Scale (LEFS) to equal or greater than 64/80 to demonstrate improvement in overall condition and self-reported functional ability.  Baseline: 32/80 (05/20/24); Goal status: INITIAL  3.  Patient will tolerate 30 minutes of walking without increased pain.  Baseline: increased pain or has to stop after 15 min of walking (05/20/24); Goal status: INITIAL  4.  Patient will demonstrate improvement in Patient Specific Functional Scale (PSFS) of equal or greater than 8/10 points to reflect clinically significant improvement in patient's most valued functional activities. Baseline: to be measured at visit 2 as appropriate (05/20/24); Goal status: INITIAL  5.  Patient will report NPRS equal or less than 3/10 during functional activities during the last 2 weeks to improve their abilitly to complete community, work and/or recreational activities with less limitation. Baseline: 8/10 (05/20/24); Goal status: INITIAL   PLAN:  PT FREQUENCY: 1-2x/week  PT DURATION: 8-12 weeks  PLANNED INTERVENTIONS: 97164- PT Re-evaluation, 97750- Physical Performance Testing, 97110-Therapeutic exercises, 97530- Therapeutic activity, [id number]- Neuromuscular re-education, 97535- Self Care, 16109- Manual therapy, [phone]- Gait training, [phone]- Aquatic Therapy, [phone]- Electrical stimulation (unattended), [phone] (1-2  muscles), [phone] (3+ muscles)- Dry Needling, Patient/Family education, Balance training, Joint mobilization, Spinal mobilization, Cryotherapy, and Moist heat  PLAN FOR NEXT SESSION: update HEP as appropriate, QL stretch, manual therapy, progressive core/LE/Balance exercises, hip strengthening. Education. Walking program.    Carilyn Charles. Artemio Larry, PT, DPT 05/20/24, 8:13 PM  [facility] [address] [place], [place] [address] P: [phone] I F: [phone]

## 2024-05-26 NOTE — Therapy (Unsigned)
 OUTPATIENT PHYSICAL THERAPY TREATMENT   Patient Name: Cathy Trevino MRN: [id number] DOB:24-Oct-1986, 39 y.o., female Today's Date: 05/26/2024  END OF SESSION:    Past Medical History:  Diagnosis Date   Anemia    Anxiety    Asthma    Depression    Menorrhagia    Past Surgical History:  Procedure Laterality Date   CESAREAN SECTION  2012   TUBAL LIGATION     Patient Active Problem List   Diagnosis Date Noted   Pain of upper abdomen 05/14/2024   Left hip pain 05/14/2024   MDD (major depressive disorder), recurrent episode, moderate (HCC) 10/02/2022   Current moderate episode of major depressive disorder without prior episode (HCC) 09/06/2022   Bereavement 09/06/2022   Anxiety disorder 09/06/2022   Osteoarthritis of facet joint of lumbar spine 04/13/2021   Spondylosis without myelopathy or radiculopathy, lumbosacral region 04/13/2021   Abnormal MRI, lumbar spine (07/15/2018) 03/09/2021   Chronic low back pain (1ry area of Pain) (Bilateral) (R>L) w/o sciatica 03/09/2021   Lumbosacral facet syndrome (Bilateral) 03/09/2021   Chronic sacroiliac joint pain (Bilateral) (R>L) 03/09/2021   DDD (degenerative disc disease), lumbosacral 03/09/2021   Morbid obesity with BMI of 40.0-44.9, adult (HCC) 03/09/2021   Levoscoliosis of lumbar spine 03/09/2021   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) 03/09/2021   Allergic rhinitis due to animal (cat) (dog) hair and dander 01/30/2021   Allergic rhinitis due to pollen 01/30/2021   Rash and other nonspecific skin eruption 01/30/2021   Chronic pain syndrome 01/30/2021   Pharmacologic therapy 01/30/2021   Disorder of skeletal system 01/30/2021   Problems influencing health status 01/30/2021   Varicose veins of leg with pain, bilateral 08/05/2019   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) 10/17/2018   Hyperlipidemia 10/17/2018   Other B-complex deficiencies 05/23/2018   Vitamin D  deficiency  05/23/2018   Menorrhagia with regular cycle 03/11/2018   Microcytic anemia 02/25/2018   Iron deficiency anemia due to chronic blood loss 02/18/2018   Chest pain 12/21/2015   Obesity 11/25/2014   Allergic rhinitis 05/21/2014   Dermatitis 10/23/2013   Other headache syndrome 06/11/2013    PCP: Alica Antu, NP  REFERRING PROVIDER: Alica Antu, NP  REFERRING DIAG: left hip pain  THERAPY DIAG:  No diagnosis found.  Rationale for Evaluation and Treatment: Rehabilitation  ONSET DATE: approximately 05/06/2024  SUBJECTIVE:   PERTINENT HISTORY: Patient is a 38 y.o. female who presents to outpatient physical therapy with a referral for medical diagnosis left hip pain. This patient's chief complaints consist of pain at the left iliac crest, leading to the following functional deficits: sitting, standing, squatting, bending, stairs, walking over 15 minutes, reading, sleeping, caring for household. Relevant past medical history and comorbidities include the following: she has Iron deficiency anemia due to chronic blood loss; Microcytic anemia; Menorrhagia with regular cycle; Varicose veins of leg with pain, bilateral; Allergic rhinitis due to animal (cat) (dog) hair and dander; Allergic rhinitis due to pollen; Chest pain; Dermatitis; 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); Hyperlipidemia; Obesity; Other B-complex deficiencies; Other headache syndrome; Rash and other nonspecific skin eruption; Vitamin D  deficiency; Chronic pain syndrome; Disorder of skeletal system; Abnormal MRI, lumbar spine (07/15/2018); Chronic low back pain (1ry area of Pain) (Bilateral) (R>L) w/o sciatica; Lumbosacral facet syndrome (Bilateral); Chronic sacroiliac joint pain (Bilateral) (R>L); DDD (degenerative disc disease), lumbosacral; Morbid obesity with BMI of 40.0-44.9, adult (HCC); Levoscoliosis of lumbar spine; 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 she  has a past medical history of Anemia, Anxiety, Asthma, Depression, and Menorrhagia. she  has a past surgical history that includes Cesarean section (2012); plantar faciitis surgery R, and Tubal ligation. Patient denies hx of cancer, stroke, seizures, lung problems, heart problems, diabetes, unexplained weight loss, unexplained changes in bowel or bladder problems, unexplained stumbling or dropping things, osteoporosis, and spinal surgery  Exercise history: She has an exercise ball and will sit on it for 10-15 minutes a day. Walks the neighborhood with dogs 3x a day for 10 minutes.   SUBJECTIVE STATEMENT: ***  PAIN: NPRS: Current: 2/10,   Best: 2/10, Worst: 8/10. Pain location: left iliac crest region, also has chronic pain in the low back central and pain in the anterior thighs (both sides equally painful). Denies symptom below knees. Paresthesia sometimes over the thighs.  Pain description: not sharp or dull, but "noticeable" Aggravating factors: sitting, standing, squatting, bending, stairs, walking, lying on left side Relieving factors: changing positions, heating pad,   FUNCTIONAL LIMITATIONS: sitting, standing, squatting, bending, stairs, walking over 15 minutes, reading, sleeping, caring for household.   LEISURE: reading  PRECAUTIONS: None  WEIGHT BEARING RESTRICTIONS: No  FALLS:  Has patient fallen in last 6 months? 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                                                                                                                                TREATMENT Therapeutic exercise: therapeutic exercises that incorporate ONE parameter at one or more areas of the body to centralize symptoms, develop strength and endurance, range of motion, and flexibility required for successful completion of functional activities.  Hooklying diaphragmatic breathing Supine pelvic tilt AROM Good carry over after initial instruction Hooklying B hip abduction against Black TB  Education on HEP including handout   Education on diagnosis, prognosis, POC, anatomy and physiology of current condition.   Pt required multimodal cuing for proper technique and to facilitate improved neuromuscular control, strength, range of motion, and functional ability resulting in improved performance and form.  PATIENT EDUCATION:  Education details: Exercise purpose/form. Self management techniques. Education on diagnosis, prognosis, POC, anatomy and physiology of current condition. Education on HEP including handout. Person educated: Patient Education method: Explanation, Demonstration, and Handouts Education comprehension: verbalized understanding, returned demonstration, and needs further education  HOME EXERCISE PROGRAM: Access Code: [email or web address] URL: [email or web address] Date: 05/20/2024 Prepared by: Alleen Isle  Exercises - Supine Diaphragmatic Breathing  - 2 x daily -  2 sets - 10 breaths - Supine Pelvic Tilt  - 2 x daily - 1 sets - 20 reps - Hooklying Clamshell with Resistance  - 1 x daily - 1 sets - 20 reps - 5 seconds hold   ASSESSMENT:  CLINICAL IMPRESSION: ***  From initial PT evaluation on 05/20/2024:  Patient is a 38 year old female referred to outpatient physical therapy with a medical diagnosis of left hip pain and presents with subacute onset (2 weeks) of left iliac crest/hip pain, superimposed on chronic low back pain with  known history of L5-S1 disc protrusion and left S1 nerve root impingement. Current pain is localized to the left iliac crest and lumbar paraspinals, worsened by static positions and transitional movements.  Pain behavior and mechanical pattern suggest lumbar spine and QL muscle involvement, possibly aggravated by altered loading from deconditioning, obesity.   No signs of true intra-articular hip pathology (FADDIR negative, FABER lateral pain), and no red flags were identified. Neurodynamic testing is unremarkable on the left.  Contributing Factors: Lumbar degenerative disc disease at L5-S1 with radicular potential Segmental hypomobility and myofascial dysfunction (QL, paraspinals) Chronic pain sensitization and sedentary lifestyle Obesity and postural adaptation (genu varus, Trendelenburg sign) Psychosocial stressors: depression, anxiety, caregiver burden  Specific Impairments: Pain with lumbar side flexion and rotation (L > R) Weakness: Left hip abductors, internal rotators Palpable tenderness: Left QL and paraspinals Limited SLS balance (likely related to motor control and pain inhibition) Low LEFS score (32/80) indicating moderate to severe functional limitations  Patient will benefit from skilled physical therapy intervention to address current body structure impairments and activity limitations to improve function and work towards goals set in current POC in order to return to prior level of function or maximal functional improvement.    OBJECTIVE IMPAIRMENTS: decreased activity tolerance, decreased endurance, decreased knowledge of condition, decreased mobility, difficulty walking, decreased ROM, decreased strength, increased muscle spasms, impaired flexibility, improper body mechanics, postural dysfunction, obesity, and pain.   ACTIVITY LIMITATIONS: carrying, lifting, bending, sitting, standing, squatting, stairs, transfers, bed mobility, locomotion level, and caring for  others  PARTICIPATION LIMITATIONS: cleaning, laundry, interpersonal relationship, shopping, community activity, and  sitting, standing, squatting, bending, stairs, walking over 15 minutes, reading, sleeping, caring for household  PERSONAL FACTORS: Past/current experiences and 3+ comorbidities:   are also affecting patient's functional outcome.   REHAB POTENTIAL: Good  CLINICAL DECISION MAKING: Evolving/moderate complexity  EVALUATION COMPLEXITY: Moderate   GOALS: Goals reviewed with patient? No  SHORT TERM GOALS: Target date: 06/03/2024  Patient will be independent with initial home exercise program for self-management of symptoms. Baseline: Initial HEP provided at IE (05/20/24); Goal status: In Progress  LONG TERM GOALS: Target date: 08/12/2024  Patient will be independent with a long-term home exercise program for self-management of symptoms.  Baseline: Initial HEP provided at IE (05/20/24); Goal status: In Progress  2.  Patient will demonstrate improved Lower Extremity Functional Scale (LEFS) to equal or greater than 64/80 to demonstrate improvement in overall condition and self-reported functional ability.  Baseline: 32/80 (05/20/24); Goal status: In Progress  3.  Patient will tolerate 30 minutes of walking without increased pain.  Baseline: increased pain or has to stop after 15 min of walking (05/20/24); Goal status: In Progress  4.  Patient will demonstrate improvement in Patient Specific Functional Scale (PSFS) of equal or greater than 8/10 points to reflect clinically significant improvement in patient's most valued functional activities. Baseline: to be measured at visit 2 as appropriate (05/20/24); Goal status: In Progress  5.  Patient will report NPRS equal or less than 3/10 during functional activities during the last 2 weeks to improve their abilitly to complete community, work and/or recreational activities with less limitation. Baseline: 8/10 (05/20/24); Goal  status: In Progress   PLAN:  PT FREQUENCY: 1-2x/week  PT DURATION: 8-12 weeks  PLANNED INTERVENTIONS: 97164- PT Re-evaluation, 97750- Physical Performance Testing, 97110-Therapeutic exercises, 97530- Therapeutic activity, [id number]- Neuromuscular re-education, 97535- Self Care, 95621- Manual therapy, [phone]- Gait training, [phone]- Aquatic Therapy, [phone]- Electrical stimulation (unattended), 20560 (1-2 muscles), 20561 (3+ muscles)- Dry Needling, Patient/Family education, Balance training, Joint mobilization, Spinal mobilization, Cryotherapy, and Moist heat  PLAN FOR NEXT SESSION: update HEP as appropriate, QL stretch, manual therapy, progressive core/LE/Balance exercises, hip strengthening. Education. Walking program.    Carilyn Charles. Artemio Larry, PT, DPT 05/26/24, 3:41 PM  [facility] [address] [place], [place] [address] P: [phone] I F: [phone]

## 2024-05-27 ENCOUNTER — Ambulatory Visit: Admitting: Physical Therapy

## 2024-05-27 ENCOUNTER — Encounter: Payer: Self-pay | Admitting: Physical Therapy

## 2024-05-27 ENCOUNTER — Other Ambulatory Visit

## 2024-05-27 VITALS — BP 97/57 | HR 87

## 2024-05-27 DIAGNOSIS — M25552 Pain in left hip: Secondary | ICD-10-CM

## 2024-05-27 DIAGNOSIS — E782 Mixed hyperlipidemia: Secondary | ICD-10-CM

## 2024-05-27 DIAGNOSIS — M5459 Other low back pain: Secondary | ICD-10-CM

## 2024-05-27 DIAGNOSIS — I10 Essential (primary) hypertension: Secondary | ICD-10-CM

## 2024-05-27 DIAGNOSIS — E669 Obesity, unspecified: Secondary | ICD-10-CM

## 2024-05-27 DIAGNOSIS — R2689 Other abnormalities of gait and mobility: Secondary | ICD-10-CM

## 2024-05-27 DIAGNOSIS — Z131 Encounter for screening for diabetes mellitus: Secondary | ICD-10-CM

## 2024-05-28 ENCOUNTER — Ambulatory Visit: Payer: Self-pay | Admitting: Cardiology

## 2024-05-28 LAB — CMP14+EGFR
ALT: 19 IU/L (ref 0–32)
AST: 20 IU/L (ref 0–40)
Albumin: 3.8 g/dL — ABNORMAL LOW (ref 3.9–4.9)
Alkaline Phosphatase: 52 IU/L (ref 44–121)
BUN/Creatinine Ratio: 19 (ref 9–23)
BUN: 13 mg/dL (ref 6–20)
Bilirubin Total: 0.4 mg/dL (ref 0.0–1.2)
CO2: 23 mmol/L (ref 20–29)
Calcium: 8.6 mg/dL — ABNORMAL LOW (ref 8.7–10.2)
Chloride: 105 mmol/L (ref 96–106)
Creatinine, Ser: 0.67 mg/dL (ref 0.57–1.00)
Globulin, Total: 2 g/dL (ref 1.5–4.5)
Glucose: 86 mg/dL (ref 70–99)
Potassium: 4.4 mmol/L (ref 3.5–5.2)
Sodium: 142 mmol/L (ref 134–144)
Total Protein: 5.8 g/dL — ABNORMAL LOW (ref 6.0–8.5)
eGFR: 114 mL/min/{1.73_m2} (ref >59)

## 2024-05-28 LAB — LIPID PANEL
Chol/HDL Ratio: 3.1 ratio (ref 0.0–4.4)
Cholesterol, Total: 113 mg/dL (ref 100–199)
HDL: 36 mg/dL — ABNORMAL LOW (ref >39)
LDL Chol Calc (NIH): 58 mg/dL (ref 0–99)
Triglycerides: 100 mg/dL (ref 0–149)
VLDL Cholesterol Cal: 19 mg/dL (ref 5–40)

## 2024-05-28 LAB — TSH: TSH: 1.56 u[IU]/mL (ref 0.450–4.500)

## 2024-05-28 LAB — HEMOGLOBIN A1C
Est. average glucose Bld gHb Est-mCnc: 105 mg/dL
Hgb A1c MFr Bld: 5.3 % (ref 4.8–5.6)

## 2024-05-29 ENCOUNTER — Encounter: Payer: Self-pay | Admitting: Cardiology

## 2024-05-29 ENCOUNTER — Encounter: Payer: Self-pay | Admitting: Physical Therapy

## 2024-05-29 ENCOUNTER — Ambulatory Visit: Payer: Medicaid Other | Admitting: Cardiology

## 2024-05-29 ENCOUNTER — Ambulatory Visit: Admitting: Physical Therapy

## 2024-05-29 ENCOUNTER — Encounter: Admitting: Physical Therapy

## 2024-05-29 VITALS — BP 114/64 | HR 96 | Ht 64.0 in | Wt 245.0 lb

## 2024-05-29 DIAGNOSIS — M5459 Other low back pain: Secondary | ICD-10-CM

## 2024-05-29 DIAGNOSIS — K219 Gastro-esophageal reflux disease without esophagitis: Secondary | ICD-10-CM | POA: Diagnosis not present

## 2024-05-29 DIAGNOSIS — E782 Mixed hyperlipidemia: Secondary | ICD-10-CM | POA: Diagnosis not present

## 2024-05-29 DIAGNOSIS — R2689 Other abnormalities of gait and mobility: Secondary | ICD-10-CM

## 2024-05-29 DIAGNOSIS — M25552 Pain in left hip: Secondary | ICD-10-CM

## 2024-05-29 DIAGNOSIS — Z0130 Encounter for examination of blood pressure without abnormal findings: Secondary | ICD-10-CM

## 2024-05-29 NOTE — Progress Notes (Signed)
 Established Patient Office Visit  Subjective:  Patient ID: Cathy Trevino, female    DOB: 1986-09-07  Age: 38 y.o. MRN: [id number]  Chief Complaint  Patient presents with   Follow-up    4 months lab results    Patient in office for 4 month follow up, discuss recent lab results. Patient doing well, no complaints today. Epigastric pain resolved with PPI. Doing PT for back pain. Discussed recent lab work. LDL at goal. Hgb A1c normal.  Continue same medications.    No other concerns at this time.   Past Medical History:  Diagnosis Date   Anemia    Anxiety    Asthma    Depression    Menorrhagia     Past Surgical History:  Procedure Laterality Date   CESAREAN SECTION  2012   TUBAL LIGATION      Social History   Socioeconomic History   Marital status: Divorced    Spouse name: Not on file   Number of children: 3   Years of education: Not on file   Highest education level: Some college, no degree  Occupational History   Not on file  Tobacco Use   Smoking status: Former    Current packs/day: 0.00    Average packs/day: 0.3 packs/day for 5.0 years (1.3 ttl pk-yrs)    Types: Cigarettes    Start date: 02/22/2011    Quit date: 02/22/2016    Years since quitting: 8.2   Smokeless tobacco: Never  Vaping Use   Vaping status: Never Used  Substance and Sexual Activity   Alcohol use: No   Drug use: No   Sexual activity: Yes    Birth control/protection: I.U.D.  Other Topics Concern   Not on file  Social History Narrative   Not on file   Social Drivers of Health   Financial Resource Strain: Not on file  Food Insecurity: Not on file  Transportation Needs: Not on file  Physical Activity: Sufficiently Active (11/04/2018)   Exercise Vital Sign    Days of Exercise per Week: 7 days    Minutes of Exercise per Session: 40 min  Stress: Not on file  Social Connections: Not on file  Intimate Partner Violence: Not on file    Family History  Problem Relation Age of Onset    Heart failure Mother    Hypertension Mother    COPD Mother    Alcohol abuse Father    Depression Father    COPD Father    Alcohol abuse Brother    Schizophrenia Brother    ADD / ADHD Daughter    Autism spectrum disorder Son    Autism spectrum disorder Son     Allergies  Allergen Reactions   Other     Seasonal allergies   Penicillins Rash    Outpatient Medications Prior to Visit  Medication Sig   acetaminophen  (TYLENOL ) 500 MG tablet Take 500 mg by mouth every 6 (six) hours as needed.   ascorbic acid (VITAMIN C) 500 MG tablet Take 500 mg by mouth every other day.   cetirizine  (ZYRTEC ) 10 MG tablet Take 1 tablet (10 mg total) by mouth at bedtime.   citalopram  (CELEXA ) 10 MG tablet Take 1 tablet (10 mg total) by mouth daily.   ergocalciferol  (VITAMIN D2) 1.25 MG (50000 UT) capsule Take 1 capsule (50,000 Units total) by mouth once a week.   fluticasone  (FLONASE ) 50 MCG/ACT nasal spray Place 1 spray into both nostrils daily.   hydrOXYzine  (VISTARIL ) 25 MG  capsule Take 1 capsule (25 mg total) by mouth 2 (two) times daily as needed for anxiety.   meloxicam  (MOBIC ) 15 MG tablet Take 1 tablet (15 mg total) by mouth daily.   Multiple Vitamin (MULTIVITAMIN ADULT PO) Take 1 tablet by mouth daily at 8 pm.   nystatin  (MYCOSTATIN /NYSTOP ) powder Apply 1 Application topically 3 (three) times daily.   pantoprazole  (PROTONIX ) 20 MG tablet Take 1 tablet (20 mg total) by mouth daily.   rosuvastatin  (CRESTOR ) 20 MG tablet Take 1 tablet (20 mg total) by mouth at bedtime.   vitamin B-12 (CYANOCOBALAMIN) 100 MCG tablet Take 100 mcg by mouth daily.   No facility-administered medications prior to visit.    Review of Systems  Constitutional: Negative.   HENT: Negative.    Eyes: Negative.   Respiratory: Negative.  Negative for shortness of breath.   Cardiovascular: Negative.  Negative for chest pain.  Gastrointestinal: Negative.  Negative for abdominal pain, constipation and diarrhea.   Genitourinary: Negative.   Musculoskeletal:  Negative for joint pain and myalgias.  Skin: Negative.   Neurological: Negative.  Negative for dizziness and headaches.  Endo/Heme/Allergies: Negative.   All other systems reviewed and are negative.      Objective:   BP 114/64   Pulse 96   Ht 5' 4 (1.626 m)   Wt 245 lb (111.1 kg)   SpO2 98%   BMI 42.05 kg/m   Vitals:   05/29/24 1001 05/29/24 1021  BP: 114/64   Pulse: (!) 115 96  Height: 5' 4 (1.626 m)   Weight: 245 lb (111.1 kg)   SpO2: 98%   BMI (Calculated): 42.03     Physical Exam Vitals and nursing note reviewed.  Constitutional:      Appearance: Normal appearance. She is normal weight.  HENT:     Head: Normocephalic and atraumatic.     Nose: Nose normal.     Mouth/Throat:     Mouth: Mucous membranes are moist.   Eyes:     Extraocular Movements: Extraocular movements intact.     Conjunctiva/sclera: Conjunctivae normal.     Pupils: Pupils are equal, round, and reactive to light.    Cardiovascular:     Rate and Rhythm: Normal rate and regular rhythm.     Pulses: Normal pulses.     Heart sounds: Normal heart sounds.  Pulmonary:     Effort: Pulmonary effort is normal.     Breath sounds: Normal breath sounds.  Abdominal:     General: Abdomen is flat. Bowel sounds are normal.     Palpations: Abdomen is soft.   Musculoskeletal:        General: Normal range of motion.     Cervical back: Normal range of motion.   Skin:    General: Skin is warm and dry.   Neurological:     General: No focal deficit present.     Mental Status: She is alert and oriented to person, place, and time.   Psychiatric:        Mood and Affect: Mood normal.        Behavior: Behavior normal.        Thought Content: Thought content normal.        Judgment: Judgment normal.      No results found for any visits on 05/29/24.  Recent Results (from the past 2160 hours)  Hemoglobin A1c     Status: None   Collection Time: 05/27/24  11:44 AM  Result Value Ref Range  Hgb A1c MFr Bld 5.3 4.8 - 5.6 %    Comment:          Prediabetes: 5.7 - 6.4          Diabetes: >6.4          Glycemic control for adults with diabetes: <7.0    Est. average glucose Bld gHb Est-mCnc 105 mg/dL  TSH     Status: None   Collection Time: 05/27/24 11:44 AM  Result Value Ref Range   TSH 1.560 0.450 - 4.500 uIU/mL  CMP14+EGFR     Status: Abnormal   Collection Time: 05/27/24 11:44 AM  Result Value Ref Range   Glucose 86 70 - 99 mg/dL   BUN 13 6 - 20 mg/dL   Creatinine, Ser 7.84 0.57 - 1.00 mg/dL   eGFR 696 >29 BM/WUX/3.24   BUN/Creatinine Ratio 19 9 - 23   Sodium 142 134 - 144 mmol/L   Potassium 4.4 3.5 - 5.2 mmol/L   Chloride 105 96 - 106 mmol/L   CO2 23 20 - 29 mmol/L   Calcium  8.6 (L) 8.7 - 10.2 mg/dL   Total Protein 5.8 (L) 6.0 - 8.5 g/dL   Albumin 3.8 (L) 3.9 - 4.9 g/dL   Globulin, Total 2.0 1.5 - 4.5 g/dL   Bilirubin Total 0.4 0.0 - 1.2 mg/dL   Alkaline Phosphatase 52 44 - 121 IU/L   AST 20 0 - 40 IU/L   ALT 19 0 - 32 IU/L  Lipid panel     Status: Abnormal   Collection Time: 05/27/24 11:44 AM  Result Value Ref Range   Cholesterol, Total 113 100 - 199 mg/dL   Triglycerides 401 0 - 149 mg/dL   HDL 36 (L) >02 mg/dL   VLDL Cholesterol Cal 19 5 - 40 mg/dL   LDL Chol Calc (NIH) 58 0 - 99 mg/dL   Chol/HDL Ratio 3.1 0.0 - 4.4 ratio    Comment:                                   T. Chol/HDL Ratio                                             Men  Women                               1/2 Avg.Risk  3.4    3.3                                   Avg.Risk  5.0    4.4                                2X Avg.Risk  9.6    7.1                                3X Avg.Risk 23.4   11.0       Assessment & Plan:  Continue same medications.  Problem List Items Addressed This Visit       Digestive  Gastroesophageal reflux disease without esophagitis     Other   Hyperlipidemia - Primary    Return in about 4 months (around 09/28/2024) for  fasting labs prior.   Total time spent: 25 minutes  [employer], NP  05/29/2024   This document may have been prepared by Dragon Voice Recognition software and as such may include unintentional dictation errors.

## 2024-05-29 NOTE — Therapy (Signed)
 OUTPATIENT PHYSICAL THERAPY TREATMENT   Patient Name: Cathy Trevino MRN: [id number] DOB:04/28/1986, 38 y.o., female Today's Date: 05/29/2024  END OF SESSION:  PT End of Session - 05/29/24 1843     Visit Number 3    Number of Visits 17    Date for PT Re-Evaluation 08/12/24    Authorization Type [employer] reporting period from 05/20/2024    Authorization Time Period [employer] auth#[id number] for 10 PT vst from 6/9-8/8    Authorization - Visit Number 2    Authorization - Number of Visits 10    Progress Note Due on Visit 10    PT Start Time 1518    PT Stop Time 1600    PT Time Calculation (min) 42 min    Activity Tolerance Patient tolerated treatment well    Behavior During Therapy [facility] for tasks assessed/performed            Past Medical History:  Diagnosis Date   Anemia    Anxiety    Asthma    Depression    Menorrhagia    Past Surgical History:  Procedure Laterality Date   CESAREAN SECTION  2012   TUBAL LIGATION     Patient Active Problem List   Diagnosis Date Noted   Gastroesophageal reflux disease without esophagitis 05/29/2024   Pain of upper abdomen 05/14/2024   Left hip pain 05/14/2024   MDD (major depressive disorder), recurrent episode, moderate (HCC) 10/02/2022   Current moderate episode of major depressive disorder without prior episode (HCC) 09/06/2022   Bereavement 09/06/2022   Anxiety disorder 09/06/2022   Osteoarthritis of facet joint of lumbar spine 04/13/2021   Spondylosis without myelopathy or radiculopathy, lumbosacral region 04/13/2021   Abnormal MRI, lumbar spine (07/15/2018) 03/09/2021   Chronic low back pain (1ry area of Pain) (Bilateral) (R>L) w/o sciatica 03/09/2021   Lumbosacral facet syndrome (Bilateral) 03/09/2021   Chronic sacroiliac joint pain (Bilateral) (R>L) 03/09/2021   DDD (degenerative disc disease), lumbosacral 03/09/2021   Morbid obesity with BMI of 40.0-44.9, adult (HCC) 03/09/2021   Levoscoliosis of lumbar  spine 03/09/2021   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) 03/09/2021   Allergic rhinitis due to animal (cat) (dog) hair and dander 01/30/2021   Allergic rhinitis due to pollen 01/30/2021   Rash and other nonspecific skin eruption 01/30/2021   Chronic pain syndrome 01/30/2021   Pharmacologic therapy 01/30/2021   Disorder of skeletal system 01/30/2021   Problems influencing health status 01/30/2021   Varicose veins of leg with pain, bilateral 08/05/2019   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) 10/17/2018   Hyperlipidemia 10/17/2018   Other B-complex deficiencies 05/23/2018   Vitamin D  deficiency 05/23/2018   Menorrhagia with regular cycle 03/11/2018   Microcytic anemia 02/25/2018   Iron deficiency anemia due to chronic blood loss 02/18/2018   Chest pain 12/21/2015   Obesity 11/25/2014   Allergic rhinitis 05/21/2014   Dermatitis 10/23/2013   Other headache syndrome 06/11/2013    PCP: Alica Antu, NP  REFERRING PROVIDER: Alica Antu, NP  REFERRING DIAG: left hip pain  THERAPY DIAG:  Other low back pain  Pain in left hip  Other abnormalities of gait and mobility  Rationale for Evaluation and Treatment: Rehabilitation  ONSET DATE: approximately 05/06/2024  SUBJECTIVE:   PERTINENT HISTORY: Patient is a 38 y.o. female who presents to outpatient physical therapy with a referral for medical diagnosis left hip pain. This patient's chief complaints consist of pain at the left iliac crest, leading  to the following functional deficits: sitting, standing, squatting, bending, stairs, walking over 15 minutes, reading, sleeping, caring for household. 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); Hyperlipidemia; Obesity; Other B-complex deficiencies; Other headache syndrome; Rash and other nonspecific skin eruption; Vitamin D  deficiency; Chronic pain syndrome; Disorder of skeletal system; Abnormal MRI, lumbar spine (07/15/2018); Chronic low back pain (1ry area of Pain) (Bilateral) (R>L) w/o sciatica; Lumbosacral facet syndrome (Bilateral); Chronic sacroiliac joint pain (Bilateral) (R>L); DDD (degenerative disc disease), lumbosacral; Morbid obesity with BMI of 40.0-44.9, adult (HCC); Levoscoliosis of lumbar spine; 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 she  has a past medical history of Anemia, Anxiety, Asthma, Depression, and Menorrhagia. she  has a past surgical history that includes Cesarean section (2012); plantar faciitis surgery R, and Tubal ligation. Patient denies hx of cancer, stroke, seizures, lung problems, heart problems, diabetes, unexplained weight loss, unexplained changes in bowel or bladder problems, unexplained stumbling or dropping things, osteoporosis, and spinal surgery  Exercise history: She has an exercise ball and will sit on it for 10-15 minutes a day. Walks the neighborhood with dogs 3x a day for 10 minutes.   SUBJECTIVE STATEMENT: Patient states her concordant pain is bothering .her today. She states she felt good after last PT session. She states she has still been learning to master the breathing exercise. The other exercises are going okay.   PAIN: NPRS: Current: 5/10 in left low  back towards iliac crest. Best: 2/10, Worst: 8/10. Pain location: left iliac crest region, also has chronic pain in the low back central and pain in the anterior thighs (both sides equally painful). Denies symptom below knees. Paresthesia sometimes over the thighs.  Pain description: not sharp or dull, but noticeable Aggravating factors: sitting, standing, squatting, bending, stairs, walking, lying on left side Relieving factors: changing positions, heating pad,   FUNCTIONAL LIMITATIONS: sitting, standing, squatting, bending, stairs, walking over 15 minutes, reading, sleeping, caring for household.   LEISURE: reading  PRECAUTIONS: None  WEIGHT BEARING RESTRICTIONS: No  FALLS:  Has patient fallen in last 6 months? 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  NuStep using bilateral upper and lower extremities. For improved extremity mobility, muscular endurance, and activity tolerance; and to induce the analgesic effect of aerobic exercise, stimulate improved joint nutrition, and prepare body structures and systems for following interventions. Also to reinforce understanding of appropriate exercise intensity to help meet physical activity guidelines for health.  Seat/handle setting: 9/9 5  minutes Level: 1 Target SPM: > 80 Average SPM: 73 RPE: 4/10  Side plank knees to elbow 3 attempts up to 12 seconds Too much concordant pain at left hip  Quadruped bird dog 3x5 each side  5 second hold  Neuromuscular Re-education: a technique or exercise performed with the goal of improving the level of communication between the body and the brain, such as for balance, motor control, muscle activation patterns, coordination, desensitization, quality of muscle contraction, proprioception, and/or kinesthetic sense needed for successful and safe completion of functional activities.   Seated diaphragmatic breathing with abdominal drawing in 1x10  Excellent carry over  Standing diaphragmatic breathing Difficulty at first with paradoxical breathing 1x10 good ones after several bouts of practice   Therapeutic activities: dynamic therapeutic activities incorporating MULTIPLE parameters or areas of the body designed to achieve improved functional performance.  Standing pallof press hold with side step (1 each way) 2x10 each way with 5# cable  Lateral stepping with black theratube under feet and crossed in front of body 1x20 each direction  Pt required multimodal cuing for proper technique and to facilitate improved neuromuscular control, strength, range of motion, and functional ability resulting in improved performance and form.  PATIENT EDUCATION:  Education details: Exercise purpose/form. Self management techniques. Person educated: Patient Education method: Explanation, Demonstration, and Handouts Education comprehension: verbalized understanding, returned demonstration, and needs further education  HOME EXERCISE PROGRAM: Access Code: [email or web address] URL: [email or web address] Date: 05/29/2024 Prepared by: Alleen Isle  Exercises - Supine Pelvic Tilt  - 2 x daily - 1 sets - 20 reps - Hooklying Clamshell with Resistance  - 1 x daily - 1 sets - 20 reps - 5 seconds hold - Bird Dog  - 1 x daily - 3 sets - 5 reps - 5 second hold - Seated Diaphragmatic Breathing  - 2 x daily - 2 sets - 10 reps - Standing Diaphragmatic Breathing  - 2 x daily - 2 sets - 10 reps -  Side stepping with band  - 3 x weekly - 3 sets - 20 reps - Pallof Press With Lateral Step  - 3 x weekly - 3 sets - 10 reps   ASSESSMENT:  CLINICAL IMPRESSION: Patient continued with focus on motor control and strengthening for core. She was unable to tolerate side plank due to concordant pain at L hip, which is the same position of the hip with the same load as last visit. However, she was able to perform lateral stepping with theratube and lateral stepping with pallof press hold without concordant pain. Patient demonstrated improved diaphragmatic breathing in seated position and was able to progress to standing position. No change in pain overall by end of session. Patient would benefit from continued management of limiting condition by skilled physical therapist to address remaining impairments and functional limitations to work towards stated goals and return to PLOF or maximal functional independence.      From initial PT  evaluation on 05/20/2024:  Patient is a 38 year old female referred to outpatient physical therapy with a medical diagnosis of left hip pain and presents with subacute onset (2 weeks) of left iliac crest/hip pain, superimposed on chronic low back pain with known history of L5-S1 disc protrusion and left S1 nerve root impingement. Current pain is localized to the left iliac crest and lumbar paraspinals, worsened by static positions and transitional movements.  Pain behavior and mechanical pattern suggest lumbar spine and QL muscle involvement, possibly aggravated by altered loading from deconditioning, obesity.   No signs of true intra-articular hip pathology (FADDIR negative, FABER lateral pain), and no red flags were identified. Neurodynamic testing is unremarkable on the left.  Contributing Factors: Lumbar degenerative disc disease at L5-S1 with radicular potential Segmental hypomobility and myofascial dysfunction (QL, paraspinals) Chronic pain sensitization and sedentary  lifestyle Obesity and postural adaptation (genu varus, Trendelenburg sign) Psychosocial stressors: depression, anxiety, caregiver burden  Specific Impairments: Pain with lumbar side flexion and rotation (L > R) Weakness: Left hip abductors, internal rotators Palpable tenderness: Left QL and paraspinals Limited SLS balance (likely related to motor control and pain inhibition) Low LEFS score (32/80) indicating moderate to severe functional limitations  Patient will benefit from skilled physical therapy intervention to address current body structure impairments and activity limitations to improve function and work towards goals set in current POC in order to return to prior level of function or maximal functional improvement.    OBJECTIVE IMPAIRMENTS: decreased activity tolerance, decreased endurance, decreased knowledge of condition, decreased mobility, difficulty walking, decreased ROM, decreased strength, increased muscle spasms, impaired flexibility, improper body mechanics, postural dysfunction, obesity, and pain.   ACTIVITY LIMITATIONS: carrying, lifting, bending, sitting, standing, squatting, stairs, transfers, bed mobility, locomotion level, and caring for others  PARTICIPATION LIMITATIONS: cleaning, laundry, interpersonal relationship, shopping, community activity, and  sitting, standing, squatting, bending, stairs, walking over 15 minutes, reading, sleeping, caring for household  PERSONAL FACTORS: Past/current experiences and 3+ comorbidities:   are also affecting patient's functional outcome.   REHAB POTENTIAL: Good  CLINICAL DECISION MAKING: Evolving/moderate complexity  EVALUATION COMPLEXITY: Moderate   GOALS: Goals reviewed with patient? No  SHORT TERM GOALS: Target date: 06/03/2024  Patient will be independent with initial home exercise program for self-management of symptoms. Baseline: Initial HEP provided at IE (05/20/24); Goal status: In Progress  LONG TERM GOALS:  Target date: 08/12/2024  Patient will be independent with a long-term home exercise program for self-management of symptoms.  Baseline: Initial HEP provided at IE (05/20/24); Goal status: In Progress  2.  Patient will demonstrate improved Lower Extremity Functional Scale (LEFS) to equal or greater than 64/80 to demonstrate improvement in overall condition and self-reported functional ability.  Baseline: 32/80 (05/20/24); Goal status: In Progress  3.  Patient will tolerate 30 minutes of walking without increased pain.  Baseline: increased pain or has to stop after 15 min of walking (05/20/24); Goal status: In Progress  4.  Patient will demonstrate improvement in Patient Specific Functional Scale (PSFS) of equal or greater than 8/10 points to reflect clinically significant improvement in patient's most valued functional activities. Baseline: to be measured at visit 2 as appropriate (05/20/24); 5.7/10 (05/27/2024);  Goal status: In Progress  5.  Patient will report NPRS equal or less than 3/10 during functional activities during the last 2 weeks to improve their abilitly to complete community, work and/or recreational activities with less limitation. Baseline: 8/10 (05/20/24); Goal status: In Progress   PLAN:  PT FREQUENCY: 1-2x/week  PT  DURATION: 8-12 weeks  PLANNED INTERVENTIONS: 97164- PT Re-evaluation, 97750- Physical Performance Testing, 97110-Therapeutic exercises, 97530- Therapeutic activity, [id number]- Neuromuscular re-education, 97535- Self Care, 16109- Manual therapy, [phone]- Gait training, [phone]- Aquatic Therapy, [phone]- Electrical stimulation (unattended), 20560 (1-2 muscles), 20561 (3+ muscles)- Dry Needling, Patient/Family education, Balance training, Joint mobilization, Spinal mobilization, Cryotherapy, and Moist heat  PLAN FOR NEXT SESSION: update HEP as appropriate, QL stretch, manual therapy, progressive core/LE/Balance exercises, hip strengthening. Education. Walking program.     Carilyn Charles. Artemio Larry, PT, DPT 05/29/24, 6:47 PM  [facility] [address] [place], [place] [address] P: [phone] I F: [phone]

## 2024-06-03 ENCOUNTER — Ambulatory Visit: Admitting: Physical Therapy

## 2024-06-03 ENCOUNTER — Encounter: Payer: Self-pay | Admitting: Physical Therapy

## 2024-06-03 DIAGNOSIS — M25552 Pain in left hip: Secondary | ICD-10-CM

## 2024-06-03 DIAGNOSIS — M5459 Other low back pain: Secondary | ICD-10-CM | POA: Diagnosis not present

## 2024-06-03 DIAGNOSIS — R2689 Other abnormalities of gait and mobility: Secondary | ICD-10-CM

## 2024-06-03 NOTE — Therapy (Signed)
 OUTPATIENT PHYSICAL THERAPY TREATMENT   Patient Name: Cathy Trevino MRN: [id number] DOB:04-09-1986, 38 y.o., female Today's Date: 06/03/2024  END OF SESSION:  PT End of Session - 06/03/24 1436     Visit Number 4    Number of Visits 17    Date for PT Re-Evaluation 08/12/24    Authorization Type [place] [employer] reporting period from 05/20/2024    Authorization Time Period [employer] auth#[id number] for 10 PT vst from 6/9-8/8    Authorization - Visit Number 3    Authorization - Number of Visits 10    Progress Note Due on Visit 10    PT Start Time 1434    PT Stop Time 1525    PT Time Calculation (min) 51 min    Activity Tolerance Patient tolerated treatment well    Behavior During Therapy [facility] for tasks assessed/performed             Past Medical History:  Diagnosis Date   Anemia    Anxiety    Asthma    Depression    Menorrhagia    Past Surgical History:  Procedure Laterality Date   CESAREAN SECTION  2012   TUBAL LIGATION     Patient Active Problem List   Diagnosis Date Noted   Gastroesophageal reflux disease without esophagitis 05/29/2024   Pain of upper abdomen 05/14/2024   Left hip pain 05/14/2024   MDD (major depressive disorder), recurrent episode, moderate (HCC) 10/02/2022   Current moderate episode of major depressive disorder without prior episode (HCC) 09/06/2022   Bereavement 09/06/2022   Anxiety disorder 09/06/2022   Osteoarthritis of facet joint of lumbar spine 04/13/2021   Spondylosis without myelopathy or radiculopathy, lumbosacral region 04/13/2021   Abnormal MRI, lumbar spine (07/15/2018) 03/09/2021   Chronic low back pain (1ry area of Pain) (Bilateral) (R>L) w/o sciatica 03/09/2021   Lumbosacral facet syndrome (Bilateral) 03/09/2021   Chronic sacroiliac joint pain (Bilateral) (R>L) 03/09/2021   DDD (degenerative disc disease), lumbosacral 03/09/2021   Morbid obesity with BMI of 40.0-44.9, adult (HCC) 03/09/2021   Levoscoliosis of lumbar  spine 03/09/2021   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) 03/09/2021   Allergic rhinitis due to animal (cat) (dog) hair and dander 01/30/2021   Allergic rhinitis due to pollen 01/30/2021   Rash and other nonspecific skin eruption 01/30/2021   Chronic pain syndrome 01/30/2021   Pharmacologic therapy 01/30/2021   Disorder of skeletal system 01/30/2021   Problems influencing health status 01/30/2021   Varicose veins of leg with pain, bilateral 08/05/2019   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) 10/17/2018   Hyperlipidemia 10/17/2018   Other B-complex deficiencies 05/23/2018   Vitamin D  deficiency 05/23/2018   Menorrhagia with regular cycle 03/11/2018   Microcytic anemia 02/25/2018   Iron deficiency anemia due to chronic blood loss 02/18/2018   Chest pain 12/21/2015   Obesity 11/25/2014   Allergic rhinitis 05/21/2014   Dermatitis 10/23/2013   Other headache syndrome 06/11/2013    PCP: Alica Antu, NP  REFERRING PROVIDER: Alica Antu, NP  REFERRING DIAG: left hip pain  THERAPY DIAG:  Other low back pain  Pain in left hip  Other abnormalities of gait and mobility  Rationale for Evaluation and Treatment: Rehabilitation  ONSET DATE: approximately 05/06/2024  SUBJECTIVE:   PERTINENT HISTORY: Patient is a 38 y.o. female who presents to outpatient physical therapy with a referral for medical diagnosis left hip pain. This patient's chief complaints consist of pain at the left iliac crest,  leading to the following functional deficits: sitting, standing, squatting, bending, stairs, walking over 15 minutes, reading, sleeping, caring for household. 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); Hyperlipidemia; Obesity; Other B-complex deficiencies; Other headache syndrome; Rash and other nonspecific skin eruption; Vitamin D  deficiency; Chronic pain syndrome; Disorder of skeletal system; Abnormal MRI, lumbar spine (07/15/2018); Chronic low back pain (1ry area of Pain) (Bilateral) (R>L) w/o sciatica; Lumbosacral facet syndrome (Bilateral); Chronic sacroiliac joint pain (Bilateral) (R>L); DDD (degenerative disc disease), lumbosacral; Morbid obesity with BMI of 40.0-44.9, adult (HCC); Levoscoliosis of lumbar spine; 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 she  has a past medical history of Anemia, Anxiety, Asthma, Depression, and Menorrhagia. she  has a past surgical history that includes Cesarean section (2012); plantar faciitis surgery R, and Tubal ligation. Patient denies hx of cancer, stroke, seizures, lung problems, heart problems, diabetes, unexplained weight loss, unexplained changes in bowel or bladder problems, unexplained stumbling or dropping things, osteoporosis, and spinal surgery  Exercise history: She has an exercise ball and will sit on it for 10-15 minutes a day. Walks the neighborhood with dogs 3x a day for 10 minutes.   SUBJECTIVE STATEMENT: Patient states she woke up with her left sided pain down by her glute now instead of the iliac crest. It is making her limp while walking. She states she felt okay after last PT session. She had tolerable pressure after last PT session. Her pain gets up to  2-3/10 when she does her HEP, which is pretty tolerable.   PAIN: NPRS: Current: 6-7/10 in left glute posterior thigh region.   Best: 2/10, Worst: 8/10. Pain location: left iliac crest region, also has chronic pain in the low back central and pain in the anterior thighs (both sides equally painful). Denies symptom below knees. Paresthesia sometimes over the thighs.  Pain description: not sharp or dull, but noticeable Aggravating factors: sitting, standing, squatting, bending, stairs, walking, lying on left side Relieving factors: changing positions, heating pad,   FUNCTIONAL LIMITATIONS: sitting, standing, squatting, bending, stairs, walking over 15 minutes, reading, sleeping, caring for household.   LEISURE: reading  PRECAUTIONS: None  WEIGHT BEARING RESTRICTIONS: No  FALLS:  Has patient fallen in last 6 months? 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  Prone press up 1x10  better 1x10 with hands on yoga blocks 1x10  better  Half hooklying curl up  Several reps to learn then 1x6 with arms crossed over chest, holding for 4 breaths at a time 2x5 with 10 second holds (hands under lumbar spine)  Quadruped bird dog 3x5 each side 10 second hold  Side plank, knees to elbow 2x5 each side with 10 second holds Concordant L thigh pain with L side down, unable to clear hips from table as well.  Cuing for form.   Pt required multimodal cuing for proper technique and to  facilitate improved neuromuscular control, strength, range of motion, and functional ability resulting in improved performance and form.  PATIENT EDUCATION:  Education details: Exercise purpose/form. Self management techniques. Person educated: Patient Education method: Explanation, Demonstration, and Handouts Education comprehension: verbalized understanding, returned demonstration, and needs further education  HOME EXERCISE PROGRAM: Access Code: [email or web address] URL: [email or web address] Date: 06/03/2024 Prepared by: Alleen Isle  Exercises - Prone Press Up  - 2 x daily - 15 reps - 1 second hold - Neutral Curl Up with Straight Leg  - 1 x daily - 3 sets - 5 reps - 10 seconds hold - Bird Dog  - 1 x daily - 3 sets - 10 reps - 5 second hold - Side Plank on Knees  - 1 x daily - 3 sets - 5 reps - 10 seconds hold   ASSESSMENT:  CLINICAL IMPRESSION: Patient arrived with pain a bit more peripheralized to the left proximal posterior thigh. Positive SLR suggests neurogenic source of pain. Attempted repeated lumbar extension exercises that seemed to have small effect when performed without added yoga block under hands. Session then focused on progressing to neutral strengthening of all sides of core. Patient had concordant pain with left side plank, but her form improved with practice. Pain decreased to 5/10 from 6-7/10 by end of session. Patient would benefit from continued management of limiting condition by skilled physical therapist to address remaining impairments and functional limitations to work towards stated goals and return to PLOF or maximal functional independence.   From initial PT evaluation on 05/20/2024:  Patient is a 38 year old female referred to outpatient physical therapy with a medical diagnosis of left hip pain and presents with subacute onset (2 weeks) of left iliac crest/hip pain, superimposed on chronic low back pain with known history of L5-S1 disc protrusion and left  S1 nerve root impingement. Current pain is localized to the left iliac crest and lumbar paraspinals, worsened by static positions and transitional movements.  Pain behavior and mechanical pattern suggest lumbar spine and QL muscle involvement, possibly aggravated by altered loading from deconditioning, obesity.   No signs of true intra-articular hip pathology (FADDIR negative, FABER lateral pain), and no red flags were identified. Neurodynamic testing is unremarkable on the left.  Contributing Factors: Lumbar degenerative disc disease at L5-S1 with radicular potential Segmental hypomobility and myofascial dysfunction (QL, paraspinals) Chronic pain sensitization and sedentary lifestyle Obesity and postural adaptation (genu varus, Trendelenburg sign) Psychosocial stressors: depression, anxiety, caregiver burden  Specific Impairments: Pain with lumbar side flexion and rotation (L > R) Weakness: Left hip abductors, internal rotators Palpable tenderness: Left QL and paraspinals Limited SLS balance (likely related to motor control and pain inhibition) Low LEFS score (  32/80) indicating moderate to severe functional limitations  Patient will benefit from skilled physical therapy intervention to address current body structure impairments and activity limitations to improve function and work towards goals set in current POC in order to return to prior level of function or maximal functional improvement.    OBJECTIVE IMPAIRMENTS: decreased activity tolerance, decreased endurance, decreased knowledge of condition, decreased mobility, difficulty walking, decreased ROM, decreased strength, increased muscle spasms, impaired flexibility, improper body mechanics, postural dysfunction, obesity, and pain.   ACTIVITY LIMITATIONS: carrying, lifting, bending, sitting, standing, squatting, stairs, transfers, bed mobility, locomotion level, and caring for others  PARTICIPATION LIMITATIONS: cleaning, laundry,  interpersonal relationship, shopping, community activity, and  sitting, standing, squatting, bending, stairs, walking over 15 minutes, reading, sleeping, caring for household  PERSONAL FACTORS: Past/current experiences and 3+ comorbidities:   are also affecting patient's functional outcome.   REHAB POTENTIAL: Good  CLINICAL DECISION MAKING: Evolving/moderate complexity  EVALUATION COMPLEXITY: Moderate   GOALS: Goals reviewed with patient? No  SHORT TERM GOALS: Target date: 06/03/2024  Patient will be independent with initial home exercise program for self-management of symptoms. Baseline: Initial HEP provided at IE (05/20/24); Goal status: In Progress  LONG TERM GOALS: Target date: 08/12/2024  Patient will be independent with a long-term home exercise program for self-management of symptoms.  Baseline: Initial HEP provided at IE (05/20/24); Goal status: In Progress  2.  Patient will demonstrate improved Lower Extremity Functional Scale (LEFS) to equal or greater than 64/80 to demonstrate improvement in overall condition and self-reported functional ability.  Baseline: 32/80 (05/20/24); Goal status: In Progress  3.  Patient will tolerate 30 minutes of walking without increased pain.  Baseline: increased pain or has to stop after 15 min of walking (05/20/24); Goal status: In Progress  4.  Patient will demonstrate improvement in Patient Specific Functional Scale (PSFS) of equal or greater than 8/10 points to reflect clinically significant improvement in patient's most valued functional activities. Baseline: to be measured at visit 2 as appropriate (05/20/24); 5.7/10 (05/27/2024);  Goal status: In Progress  5.  Patient will report NPRS equal or less than 3/10 during functional activities during the last 2 weeks to improve their abilitly to complete community, work and/or recreational activities with less limitation. Baseline: 8/10 (05/20/24); Goal status: In Progress   PLAN:  PT  FREQUENCY: 1-2x/week  PT DURATION: 8-12 weeks  PLANNED INTERVENTIONS: 97164- PT Re-evaluation, 97750- Physical Performance Testing, 97110-Therapeutic exercises, 97530- Therapeutic activity, [id number]- Neuromuscular re-education, 97535- Self Care, 96295- Manual therapy, [phone]- Gait training, [phone]- Aquatic Therapy, [phone]- Electrical stimulation (unattended), 20560 (1-2 muscles), 20561 (3+ muscles)- Dry Needling, Patient/Family education, Balance training, Joint mobilization, Spinal mobilization, Cryotherapy, and Moist heat  PLAN FOR NEXT SESSION: update HEP as appropriate, QL stretch, manual therapy, progressive core/LE/Balance exercises, hip strengthening. Education. Walking program.    Carilyn Charles. Artemio Larry, PT, DPT 06/03/24, 3:26 PM  [facility] [address] [place], [place] [address] P: [phone] I F: [phone]

## 2024-06-04 ENCOUNTER — Ambulatory Visit: Admitting: Physical Therapy

## 2024-06-05 ENCOUNTER — Ambulatory Visit: Admitting: Physical Therapy

## 2024-06-05 ENCOUNTER — Encounter: Payer: Self-pay | Admitting: Physical Therapy

## 2024-06-05 DIAGNOSIS — R2689 Other abnormalities of gait and mobility: Secondary | ICD-10-CM

## 2024-06-05 DIAGNOSIS — M25552 Pain in left hip: Secondary | ICD-10-CM

## 2024-06-05 DIAGNOSIS — M5459 Other low back pain: Secondary | ICD-10-CM | POA: Diagnosis not present

## 2024-06-05 NOTE — Therapy (Signed)
 OUTPATIENT PHYSICAL THERAPY TREATMENT   Patient Name: Cathy Trevino MRN: [id number] DOB:1986/03/05, 38 y.o., female Today's Date: 06/05/2024  END OF SESSION:  PT End of Session - 06/05/24 1628     Visit Number 5    Number of Visits 17    Date for PT Re-Evaluation 08/12/24    Authorization Type [place] [employer]ICA[employer] reporting period from 05/20/2024    Authorization Time Period [employer] auth#[id number] for 10 PT vst from 6/9-8/8    Authorization - Visit Number 4    Authorization - Number of Visits 10    Progress Note Due on Visit 10    PT Start Time 1609    PT Stop Time 1648    PT Time Calculation (min) 39 min    Activity Tolerance Patient tolerated treatment well    Behavior During Therapy [facility] for tasks assessed/performed          Past Medical History:  Diagnosis Date   Anemia    Anxiety    Asthma    Depression    Menorrhagia    Past Surgical History:  Procedure Laterality Date   CESAREAN SECTION  2012   TUBAL LIGATION     Patient Active Problem List   Diagnosis Date Noted   Gastroesophageal reflux disease without esophagitis 05/29/2024   Pain of upper abdomen 05/14/2024   Left hip pain 05/14/2024   MDD (major depressive disorder), recurrent episode, moderate (HCC) 10/02/2022   Current moderate episode of major depressive disorder without prior episode (HCC) 09/06/2022   Bereavement 09/06/2022   Anxiety disorder 09/06/2022   Osteoarthritis of facet joint of lumbar spine 04/13/2021   Spondylosis without myelopathy or radiculopathy, lumbosacral region 04/13/2021   Abnormal MRI, lumbar spine (07/15/2018) 03/09/2021   Chronic low back pain (1ry area of Pain) (Bilateral) (R>L) w/o sciatica 03/09/2021   Lumbosacral facet syndrome (Bilateral) 03/09/2021   Chronic sacroiliac joint pain (Bilateral) (R>L) 03/09/2021   DDD (degenerative disc disease), lumbosacral 03/09/2021   Morbid obesity with BMI of 40.0-44.9, adult (HCC) 03/09/2021   Levoscoliosis of lumbar spine  03/09/2021   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) 03/09/2021   Allergic rhinitis due to animal (cat) (dog) hair and dander 01/30/2021   Allergic rhinitis due to pollen 01/30/2021   Rash and other nonspecific skin eruption 01/30/2021   Chronic pain syndrome 01/30/2021   Pharmacologic therapy 01/30/2021   Disorder of skeletal system 01/30/2021   Problems influencing health status 01/30/2021   Varicose veins of leg with pain, bilateral 08/05/2019   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) 10/17/2018   Hyperlipidemia 10/17/2018   Other B-complex deficiencies 05/23/2018   Vitamin D  deficiency 05/23/2018   Menorrhagia with regular cycle 03/11/2018   Microcytic anemia 02/25/2018   Iron deficiency anemia due to chronic blood loss 02/18/2018   Chest pain 12/21/2015   Obesity 11/25/2014   Allergic rhinitis 05/21/2014   Dermatitis 10/23/2013   Other headache syndrome 06/11/2013    PCP: Alica Antu, NP  REFERRING PROVIDER: Alica Antu, NP  REFERRING DIAG: left hip pain  THERAPY DIAG:  Other low back pain  Pain in left hip  Other abnormalities of gait and mobility  Rationale for Evaluation and Treatment: Rehabilitation  ONSET DATE: approximately 05/06/2024  SUBJECTIVE:   PERTINENT HISTORY: Patient is a 38 y.o. female who presents to outpatient physical therapy with a referral for medical diagnosis left hip pain. This patient's chief complaints consist of pain at the left iliac crest, leading to the  following functional deficits: sitting, standing, squatting, bending, stairs, walking over 15 minutes, reading, sleeping, caring for household. 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); Hyperlipidemia; Obesity; Other B-complex deficiencies; Other headache syndrome; Rash and other nonspecific skin eruption; Vitamin D  deficiency; Chronic pain syndrome; Disorder of skeletal system; Abnormal MRI, lumbar spine (07/15/2018); Chronic low back pain (1ry area of Pain) (Bilateral) (R>L) w/o sciatica; Lumbosacral facet syndrome (Bilateral); Chronic sacroiliac joint pain (Bilateral) (R>L); DDD (degenerative disc disease), lumbosacral; Morbid obesity with BMI of 40.0-44.9, adult (HCC); Levoscoliosis of lumbar spine; 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 she  has a past medical history of Anemia, Anxiety, Asthma, Depression, and Menorrhagia. she  has a past surgical history that includes Cesarean section (2012); plantar faciitis surgery R, and Tubal ligation. Patient denies hx of cancer, stroke, seizures, lung problems, heart problems, diabetes, unexplained weight loss, unexplained changes in bowel or bladder problems, unexplained stumbling or dropping things, osteoporosis, and spinal surgery  Exercise history: She has an exercise ball and will sit on it for 10-15 minutes a day. Walks the neighborhood with dogs 3x a day for 10 minutes.   SUBJECTIVE STATEMENT: Patient states her shoulders are a little sore and the back pain has moved back to its original spot. Her favorite exercise is now the bird dog.   PAIN: NPRS: Current: 5/10 in left iliac crest.   Best: 2/10, Worst: 8/10. Pain location: left iliac crest  region, also has chronic pain in the low back central and pain in the anterior thighs (both sides equally painful). Denies symptom below knees. Paresthesia sometimes over the thighs.  Pain description: not sharp or dull, but noticeable Aggravating factors: sitting, standing, squatting, bending, stairs, walking, lying on left side Relieving factors: changing positions, heating pad,   FUNCTIONAL LIMITATIONS: sitting, standing, squatting, bending, stairs, walking over 15 minutes, reading, sleeping, caring for household.   LEISURE: reading  PRECAUTIONS: None  WEIGHT BEARING RESTRICTIONS: No  FALLS:  Has patient fallen in last 6 months? 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  Prone press up 2x10  1x10  better  Half hooklying curl up  Several reps to learn then 1x6 with arms crossed over chest, holding for 4 breaths at a time 2x5 with 10 second holds (hands under lumbar spine)  Quadruped bird dog 3x5 each side 10 second hold  Side plank, knees to elbow 2x5 each side with 10 second holds Concordant L thigh pain with L side down, unable to clear hips from table as well.  Cuing for form.   Pt required multimodal cuing for proper technique and to facilitate improved neuromuscular control, strength, range of motion, and functional ability resulting in improved performance and form.  PATIENT EDUCATION:  Education details: Exercise purpose/form. Self management  techniques. Person educated: Patient Education method: Explanation, Demonstration, and Handouts Education comprehension: verbalized understanding, returned demonstration, and needs further education  HOME EXERCISE PROGRAM: Access Code: [email or web address] URL: [email or web address] Date: 06/05/2024 Prepared by: Alleen [place]  Exercises - Prone Press Up  - 2 x daily - 15 reps - 1 second hold - Neutral Curl Up with Straight Leg  - 1 x daily - 3 sets - 5 reps - 10 seconds hold - Bird Dog  - 1 x daily - 3 sets - 5 reps - 10 second hold - Side Plank on Knees  - 1 x daily - 3 sets - 5 reps - 10 seconds hold   ASSESSMENT:  CLINICAL IMPRESSION: Patient's pain back in its original location, more centralized. Able to perform exercises without worsening in pain. Improved form and problem solving with PT to be more successful in exercises. Plan to continue with core conditioning exercises as tolerated. Patient would benefit from continued management of limiting condition by skilled physical therapist to address remaining impairments and functional limitations to work towards stated goals and return to PLOF or maximal functional independence.    From initial PT evaluation on 05/20/2024:  Patient is a 38 year old female referred to outpatient physical therapy with a medical diagnosis of left hip pain and presents with subacute onset (2 weeks) of left iliac crest/hip pain, superimposed on chronic low back pain with known history of L5-S1 disc protrusion and left S1 nerve root impingement. Current pain is localized to the left iliac crest and lumbar paraspinals, worsened by static positions and transitional movements.  Pain behavior and mechanical pattern suggest lumbar spine and QL muscle involvement, possibly aggravated by altered loading from deconditioning, obesity.   No signs of true intra-articular hip pathology (FADDIR negative, FABER lateral pain), and no red flags were identified. Neurodynamic  testing is unremarkable on the left.  Contributing Factors: Lumbar degenerative disc disease at L5-S1 with radicular potential Segmental hypomobility and myofascial dysfunction (QL, paraspinals) Chronic pain sensitization and sedentary lifestyle Obesity and postural adaptation (genu varus, Trendelenburg sign) Psychosocial stressors: depression, anxiety, caregiver burden  Specific Impairments: Pain with lumbar side flexion and rotation (L > R) Weakness: Left hip abductors, internal rotators Palpable tenderness: Left QL and paraspinals Limited SLS balance (likely related to motor control and pain inhibition) Low LEFS score (32/80) indicating moderate to severe functional limitations  Patient will benefit from skilled physical therapy intervention to address current body structure impairments and activity limitations to improve function and work towards goals set in current POC in order to return to prior level of function or maximal functional  improvement.    OBJECTIVE IMPAIRMENTS: decreased activity tolerance, decreased endurance, decreased knowledge of condition, decreased mobility, difficulty walking, decreased ROM, decreased strength, increased muscle spasms, impaired flexibility, improper body mechanics, postural dysfunction, obesity, and pain.   ACTIVITY LIMITATIONS: carrying, lifting, bending, sitting, standing, squatting, stairs, transfers, bed mobility, locomotion level, and caring for others  PARTICIPATION LIMITATIONS: cleaning, laundry, interpersonal relationship, shopping, community activity, and  sitting, standing, squatting, bending, stairs, walking over 15 minutes, reading, sleeping, caring for household  PERSONAL FACTORS: Past/current experiences and 3+ comorbidities:   are also affecting patient's functional outcome.   REHAB POTENTIAL: Good  CLINICAL DECISION MAKING: Evolving/moderate complexity  EVALUATION COMPLEXITY: Moderate   GOALS: Goals reviewed with patient?  No  SHORT TERM GOALS: Target date: 06/03/2024  Patient will be independent with initial home exercise program for self-management of symptoms. Baseline: Initial HEP provided at IE (05/20/24); Goal status: In Progress  LONG TERM GOALS: Target date: 08/12/2024  Patient will be independent with a long-term home exercise program for self-management of symptoms.  Baseline: Initial HEP provided at IE (05/20/24); Goal status: In Progress  2.  Patient will demonstrate improved Lower Extremity Functional Scale (LEFS) to equal or greater than 64/80 to demonstrate improvement in overall condition and self-reported functional ability.  Baseline: 32/80 (05/20/24); Goal status: In Progress  3.  Patient will tolerate 30 minutes of walking without increased pain.  Baseline: increased pain or has to stop after 15 min of walking (05/20/24); Goal status: In Progress  4.  Patient will demonstrate improvement in Patient Specific Functional Scale (PSFS) of equal or greater than 8/10 points to reflect clinically significant improvement in patient's most valued functional activities. Baseline: to be measured at visit 2 as appropriate (05/20/24); 5.7/10 (05/27/2024);  Goal status: In Progress  5.  Patient will report NPRS equal or less than 3/10 during functional activities during the last 2 weeks to improve their abilitly to complete community, work and/or recreational activities with less limitation. Baseline: 8/10 (05/20/24); Goal status: In Progress   PLAN:  PT FREQUENCY: 1-2x/week  PT DURATION: 8-12 weeks  PLANNED INTERVENTIONS: 97164- PT Re-evaluation, 97750- Physical Performance Testing, 97110-Therapeutic exercises, 97530- Therapeutic activity, [id number]- Neuromuscular re-education, 97535- Self Care, 16109- Manual therapy, [phone]- Gait training, [phone]- Aquatic Therapy, [phone]- Electrical stimulation (unattended), 20560 (1-2 muscles), 20561 (3+ muscles)- Dry Needling, Patient/Family education, Balance  training, Joint mobilization, Spinal mobilization, Cryotherapy, and Moist heat  PLAN FOR NEXT SESSION: update HEP as appropriate, QL stretch, manual therapy, progressive core/LE/Balance exercises, hip strengthening. Education. Walking program.    Carilyn Charles. Artemio Larry, PT, DPT 06/05/24, 4:49 PM  [facility] [address] [place], [place] [address] P: [phone] I F: [phone]

## 2024-06-12 ENCOUNTER — Ambulatory Visit: Admitting: Physical Therapy

## 2024-06-17 ENCOUNTER — Ambulatory Visit: Attending: Cardiology | Admitting: Physical Therapy

## 2024-06-17 ENCOUNTER — Encounter: Payer: Self-pay | Admitting: Physical Therapy

## 2024-06-17 DIAGNOSIS — M5459 Other low back pain: Secondary | ICD-10-CM | POA: Diagnosis present

## 2024-06-17 DIAGNOSIS — R2689 Other abnormalities of gait and mobility: Secondary | ICD-10-CM | POA: Diagnosis present

## 2024-06-17 DIAGNOSIS — M25552 Pain in left hip: Secondary | ICD-10-CM | POA: Diagnosis present

## 2024-06-17 NOTE — Therapy (Signed)
 OUTPATIENT PHYSICAL THERAPY TREATMENT   Patient Name: Cathy Trevino MRN: [id number] DOB:1986-02-28, 38 y.o., female Today's Date: 06/17/2024  END OF SESSION:  PT End of Session - 06/17/24 1906     Visit Number 6    Number of Visits 17    Date for PT Re-Evaluation 08/12/24    Authorization Type [employer] reporting period from 05/20/2024    Authorization Time Period [employer] auth#[id number] for 10 PT vst from 6/9-8/8    Authorization - Visit Number 5    Authorization - Number of Visits 10    Progress Note Due on Visit 10    PT Start Time 1749    PT Stop Time 1852    PT Time Calculation (min) 63 min    Activity Tolerance Patient tolerated treatment well    Behavior During Therapy [facility] for tasks assessed/performed           Past Medical History:  Diagnosis Date   Anemia    Anxiety    Asthma    Depression    Menorrhagia    Past Surgical History:  Procedure Laterality Date   CESAREAN SECTION  2012   TUBAL LIGATION     Patient Active Problem List   Diagnosis Date Noted   Gastroesophageal reflux disease without esophagitis 05/29/2024   Pain of upper abdomen 05/14/2024   Left hip pain 05/14/2024   MDD (major depressive disorder), recurrent episode, moderate (HCC) 10/02/2022   Current moderate episode of major depressive disorder without prior episode (HCC) 09/06/2022   Bereavement 09/06/2022   Anxiety disorder 09/06/2022   Osteoarthritis of facet joint of lumbar spine 04/13/2021   Spondylosis without myelopathy or radiculopathy, lumbosacral region 04/13/2021   Abnormal MRI, lumbar spine (07/15/2018) 03/09/2021   Chronic low back pain (1ry area of Pain) (Bilateral) (R>L) w/o sciatica 03/09/2021   Lumbosacral facet syndrome (Bilateral) 03/09/2021   Chronic sacroiliac joint pain (Bilateral) (R>L) 03/09/2021   DDD (degenerative disc disease), lumbosacral 03/09/2021   Morbid obesity with BMI of 40.0-44.9, adult (HCC) 03/09/2021   Levoscoliosis of lumbar spine  03/09/2021   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) 03/09/2021   Allergic rhinitis due to animal (cat) (dog) hair and dander 01/30/2021   Allergic rhinitis due to pollen 01/30/2021   Rash and other nonspecific skin eruption 01/30/2021   Chronic pain syndrome 01/30/2021   Pharmacologic therapy 01/30/2021   Disorder of skeletal system 01/30/2021   Problems influencing health status 01/30/2021   Varicose veins of leg with pain, bilateral 08/05/2019   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) 10/17/2018   Hyperlipidemia 10/17/2018   Other B-complex deficiencies 05/23/2018   Vitamin D  deficiency 05/23/2018   Menorrhagia with regular cycle 03/11/2018   Microcytic anemia 02/25/2018   Iron deficiency anemia due to chronic blood loss 02/18/2018   Chest pain 12/21/2015   Obesity 11/25/2014   Allergic rhinitis 05/21/2014   Dermatitis 10/23/2013   Other headache syndrome 06/11/2013    PCP: Carin Gauze, NP  REFERRING PROVIDER: Carin Gauze, NP  REFERRING DIAG: left hip pain  THERAPY DIAG:  Other low back pain  Pain in left hip  Other abnormalities of gait and mobility  Rationale for Evaluation and Treatment: Rehabilitation  ONSET DATE: approximately 05/06/2024  SUBJECTIVE:   PERTINENT HISTORY: Patient is a 38 y.o. female who presents to outpatient physical therapy with a referral for medical diagnosis left hip pain. This patient's chief complaints consist of pain at the left iliac crest, leading to  the following functional deficits: sitting, standing, squatting, bending, stairs, walking over 15 minutes, reading, sleeping, caring for household. 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); Hyperlipidemia; Obesity; Other B-complex deficiencies; Other headache syndrome; Rash and other nonspecific skin eruption; Vitamin D  deficiency; Chronic pain syndrome; Disorder of skeletal system; Abnormal MRI, lumbar spine (07/15/2018); Chronic low back pain (1ry area of Pain) (Bilateral) (R>L) w/o sciatica; Lumbosacral facet syndrome (Bilateral); Chronic sacroiliac joint pain (Bilateral) (R>L); DDD (degenerative disc disease), lumbosacral; Morbid obesity with BMI of 40.0-44.9, adult (HCC); Levoscoliosis of lumbar spine; 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 she  has a past medical history of Anemia, Anxiety, Asthma, Depression, and Menorrhagia. she  has a past surgical history that includes Cesarean section (2012); plantar faciitis surgery R, and Tubal ligation. Patient denies hx of cancer, stroke, seizures, lung problems, heart problems, diabetes, unexplained weight loss, unexplained changes in bowel or bladder problems, unexplained stumbling or dropping things, osteoporosis, and spinal surgery  Exercise history: She has an exercise ball and will sit on it for 10-15 minutes a day. Walks the neighborhood with dogs 3x a day for 10 minutes.   SUBJECTIVE STATEMENT: Patient states she is here. She has been walking slow for the last two days. Patient states her HEP is going okay. She is still working on her side planks. She states the side plank bothers her on the left side.   PAIN: NPRS: Current: 8/10 in left  posterior iliac crest.   Best: 2/10, Worst: 8/10. Pain location: left iliac crest region, also has chronic pain in the low back central and pain in the anterior thighs (both sides equally painful). Denies symptom below knees. Paresthesia sometimes over the thighs.  Pain description: not sharp or dull, but noticeable Aggravating factors: sitting, standing, squatting, bending, stairs, walking, lying on left side Relieving factors: changing positions, heating pad,   FUNCTIONAL LIMITATIONS: sitting, standing, squatting, bending, stairs, walking over 15 minutes, reading, sleeping, caring for household.   LEISURE: reading  PRECAUTIONS: None  WEIGHT BEARING RESTRICTIONS: No  FALLS:  Has patient fallen in last 6 months? 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  Prone press up 1x10, no change  L Side glide at wall (R side to wall) 1x10 No change  (Manual therapy - see below)  Seated lumbar flexion 1x10 Possibly increased RON, increased pain  Prone press up 1x10  Check of asterisk sign throughout session to assess effect of interventions: left sidebending  Therapeutic activities: dynamic therapeutic activities incorporating MULTIPLE parameters or areas of the body designed to achieve improved functional performance.  Goblet squat with abdominal brace and rooting Several reps sit <> stand from low plinth trying to progress to squat while working on abdominal brace Then  isolated rooting practice (hip ER with feet braced into floor)  Neuromuscular Re-education: a technique or exercise performed with the goal of improving the level of communication between the body and the brain, such as for balance, motor control, muscle activation patterns, coordination, desensitization, quality of muscle contraction, proprioception, and/or kinesthetic sense needed for successful and safe completion of functional activities.   Standing practice of breathing mechanics: starting with diaphragmatic breathing, attempting to progress to 360 breathing with the goal of using it with a brace during squat. Patient able to perform standing diaphragmatic breathing fairly consistently with heavy concentration but unable to translate it to 360 breathing despite extended time spent with visual, tactile, and verbal cuing in various ways. Unable to get the bucket handle movement of the lower ribs.   Manual therapy: to reduce pain and tissue tension, improve range of motion, neuromodulation, in order to promote improved ability to complete functional activities. HOOKLYING  MDT lumbar flexion- rotation stretch: knees to the left 4x5 seconds then 20-30 seconds Discontinued when too painful at left hip No change MDT lumbar flexion- rotation stretch: knees to the right 1x45 seconds Less stiff than other side Discontinued when too painful at left low back No change PRONE CPA along lumbar spine to assess (painful at L5) L UPA grade III-IV: 1x40 seconds at L5, 1x35 seconds at L4, 1x30 seconds at L3 (less tender at more cephalic segments).  No change  SIDELYING  L QL stretch with contract-relax and STM to L QL No change    Pt required multimodal cuing for proper technique and to facilitate improved neuromuscular control, strength, range of motion, and functional ability resulting in improved performance and form.  PATIENT EDUCATION:  Education details: Exercise purpose/form. Self management  techniques. Person educated: Patient Education method: Explanation, Demonstration, and Handouts Education comprehension: verbalized understanding, returned demonstration, and needs further education  HOME EXERCISE PROGRAM: Access Code: [email or web address] URL: [email or web address] Date: 06/05/2024 Prepared by: Camie Cleverly  Exercises - Prone Press Up  - 2 x daily - 15 reps - 1 second hold - Neutral Curl Up with Straight Leg  - 1 x daily - 3 sets - 5 reps - 10 seconds hold - Bird Dog  - 1 x daily - 3 sets - 5 reps - 10 second hold - Side Plank on Knees  - 1 x daily - 3 sets - 5 reps - 10 seconds hold   ASSESSMENT:  CLINICAL IMPRESSION: Patient's pain persists in left low back and is worsened temporarily by left sidebending, flexion-rotation stretch both directions, and L UPA and CPA to lower lumbar segments. No direction in repeated motions made any lasting change to patient's pain. She continues to have  some pain during core strengthening exercises (especially side plank) but expresses she is determined not to give up and performs them anyway. When nothing was able to change pain meaningfully, focus of session turned to functional strengthening and motor control for strong cannister bracing strategy for squat. Pateint able to perform diaphragmatic breathing in standing position with focus but unable to progress to 360 degree breathing, unable to get bucket handle movement of the lower ribs. Lack of strong support and coordination through the core may be exacerbating patient's symptoms. Patient would benefit from continued management of limiting condition by skilled physical therapist to address remaining impairments and functional limitations to work towards stated goals and return to PLOF or maximal functional independence.     From initial PT evaluation on 05/20/2024:  Patient is a 38 year old female referred to outpatient physical therapy with a medical diagnosis of left hip pain and  presents with subacute onset (2 weeks) of left iliac crest/hip pain, superimposed on chronic low back pain with known history of L5-S1 disc protrusion and left S1 nerve root impingement. Current pain is localized to the left iliac crest and lumbar paraspinals, worsened by static positions and transitional movements.  Pain behavior and mechanical pattern suggest lumbar spine and QL muscle involvement, possibly aggravated by altered loading from deconditioning, obesity.   No signs of true intra-articular hip pathology (FADDIR negative, FABER lateral pain), and no red flags were identified. Neurodynamic testing is unremarkable on the left.  Contributing Factors: Lumbar degenerative disc disease at L5-S1 with radicular potential Segmental hypomobility and myofascial dysfunction (QL, paraspinals) Chronic pain sensitization and sedentary lifestyle Obesity and postural adaptation (genu varus, Trendelenburg sign) Psychosocial stressors: depression, anxiety, caregiver burden  Specific Impairments: Pain with lumbar side flexion and rotation (L > R) Weakness: Left hip abductors, internal rotators Palpable tenderness: Left QL and paraspinals Limited SLS balance (likely related to motor control and pain inhibition) Low LEFS score (32/80) indicating moderate to severe functional limitations  Patient will benefit from skilled physical therapy intervention to address current body structure impairments and activity limitations to improve function and work towards goals set in current POC in order to return to prior level of function or maximal functional improvement.    OBJECTIVE IMPAIRMENTS: decreased activity tolerance, decreased endurance, decreased knowledge of condition, decreased mobility, difficulty walking, decreased ROM, decreased strength, increased muscle spasms, impaired flexibility, improper body mechanics, postural dysfunction, obesity, and pain.   ACTIVITY LIMITATIONS: carrying, lifting,  bending, sitting, standing, squatting, stairs, transfers, bed mobility, locomotion level, and caring for others  PARTICIPATION LIMITATIONS: cleaning, laundry, interpersonal relationship, shopping, community activity, and  sitting, standing, squatting, bending, stairs, walking over 15 minutes, reading, sleeping, caring for household  PERSONAL FACTORS: Past/current experiences and 3+ comorbidities:   are also affecting patient's functional outcome.   REHAB POTENTIAL: Good  CLINICAL DECISION MAKING: Evolving/moderate complexity  EVALUATION COMPLEXITY: Moderate   GOALS: Goals reviewed with patient? No  SHORT TERM GOALS: Target date: 06/03/2024  Patient will be independent with initial home exercise program for self-management of symptoms. Baseline: Initial HEP provided at IE (05/20/24); Goal status: In Progress  LONG TERM GOALS: Target date: 08/12/2024  Patient will be independent with a long-term home exercise program for self-management of symptoms.  Baseline: Initial HEP provided at IE (05/20/24); Goal status: In Progress  2.  Patient will demonstrate improved Lower Extremity Functional Scale (LEFS) to equal or greater than 64/80 to demonstrate improvement in overall condition and self-reported functional ability.  Baseline: 32/80 (05/20/24); Goal status: In  Progress  3.  Patient will tolerate 30 minutes of walking without increased pain.  Baseline: increased pain or has to stop after 15 min of walking (05/20/24); Goal status: In Progress  4.  Patient will demonstrate improvement in Patient Specific Functional Scale (PSFS) of equal or greater than 8/10 points to reflect clinically significant improvement in patient's most valued functional activities. Baseline: to be measured at visit 2 as appropriate (05/20/24); 5.7/10 (05/27/2024);  Goal status: In Progress  5.  Patient will report NPRS equal or less than 3/10 during functional activities during the last 2 weeks to improve their  abilitly to complete community, work and/or recreational activities with less limitation. Baseline: 8/10 (05/20/24); Goal status: In Progress   PLAN:  PT FREQUENCY: 1-2x/week  PT DURATION: 8-12 weeks  PLANNED INTERVENTIONS: 97164- PT Re-evaluation, 97750- Physical Performance Testing, 97110-Therapeutic exercises, 97530- Therapeutic activity, [id number]- Neuromuscular re-education, 97535- Self Care, 02859- Manual therapy, [phone]- Gait training, [phone]- Aquatic Therapy, [phone]- Electrical stimulation (unattended), 20560 (1-2 muscles), 20561 (3+ muscles)- Dry Needling, Patient/Family education, Balance training, Joint mobilization, Spinal mobilization, Cryotherapy, and Moist heat  PLAN FOR NEXT SESSION: update HEP as appropriate, QL stretch, manual therapy, progressive core/LE/Balance exercises, hip strengthening. Education. Walking program.    Camie SAUNDERS. Juli, PT, DPT 06/17/24, 7:12 PM  [facility] [address] [place], [place] [address] P: [phone] I F: [phone]

## 2024-06-19 ENCOUNTER — Encounter: Payer: Self-pay | Admitting: Physical Therapy

## 2024-06-19 ENCOUNTER — Ambulatory Visit: Admitting: Physical Therapy

## 2024-06-19 DIAGNOSIS — M5459 Other low back pain: Secondary | ICD-10-CM | POA: Diagnosis not present

## 2024-06-19 DIAGNOSIS — R2689 Other abnormalities of gait and mobility: Secondary | ICD-10-CM

## 2024-06-19 DIAGNOSIS — M25552 Pain in left hip: Secondary | ICD-10-CM

## 2024-06-19 NOTE — Therapy (Signed)
 OUTPATIENT PHYSICAL THERAPY TREATMENT   Patient Name: Cathy Trevino MRN: [id number] DOB:1986/05/31, 38 y.o., female Today's Date: 06/19/2024  END OF SESSION:  PT End of Session - 06/19/24 1900     Visit Number 7    Number of Visits 17    Date for PT Re-Evaluation 08/12/24    Authorization Type [place] [employer] reporting period from 05/20/2024    Authorization Time Period [employer] auth#[id number] for 10 PT vst from 6/9-8/8    Authorization - Visit Number 7    Authorization - Number of Visits 10    Progress Note Due on Visit 10    PT Start Time 1825    PT Stop Time 1903    PT Time Calculation (min) 38 min    Activity Tolerance Patient tolerated treatment well    Behavior During Therapy [facility] for tasks assessed/performed            Past Medical History:  Diagnosis Date   Anemia    Anxiety    Asthma    Depression    Menorrhagia    Past Surgical History:  Procedure Laterality Date   CESAREAN SECTION  2012   TUBAL LIGATION     Patient Active Problem List   Diagnosis Date Noted   Gastroesophageal reflux disease without esophagitis 05/29/2024   Pain of upper abdomen 05/14/2024   Left hip pain 05/14/2024   MDD (major depressive disorder), recurrent episode, moderate (HCC) 10/02/2022   Current moderate episode of major depressive disorder without prior episode (HCC) 09/06/2022   Bereavement 09/06/2022   Anxiety disorder 09/06/2022   Osteoarthritis of facet joint of lumbar spine 04/13/2021   Spondylosis without myelopathy or radiculopathy, lumbosacral region 04/13/2021   Abnormal MRI, lumbar spine (07/15/2018) 03/09/2021   Chronic low back pain (1ry area of Pain) (Bilateral) (R>L) w/o sciatica 03/09/2021   Lumbosacral facet syndrome (Bilateral) 03/09/2021   Chronic sacroiliac joint pain (Bilateral) (R>L) 03/09/2021   DDD (degenerative disc disease), lumbosacral 03/09/2021   Morbid obesity with BMI of 40.0-44.9, adult (HCC) 03/09/2021   Levoscoliosis of lumbar spine  03/09/2021   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) 03/09/2021   Allergic rhinitis due to animal (cat) (dog) hair and dander 01/30/2021   Allergic rhinitis due to pollen 01/30/2021   Rash and other nonspecific skin eruption 01/30/2021   Chronic pain syndrome 01/30/2021   Pharmacologic therapy 01/30/2021   Disorder of skeletal system 01/30/2021   Problems influencing health status 01/30/2021   Varicose veins of leg with pain, bilateral 08/05/2019   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) 10/17/2018   Hyperlipidemia 10/17/2018   Other B-complex deficiencies 05/23/2018   Vitamin D  deficiency 05/23/2018   Menorrhagia with regular cycle 03/11/2018   Microcytic anemia 02/25/2018   Iron deficiency anemia due to chronic blood loss 02/18/2018   Chest pain 12/21/2015   Obesity 11/25/2014   Allergic rhinitis 05/21/2014   Dermatitis 10/23/2013   Other headache syndrome 06/11/2013    PCP: Carin Gauze, NP  REFERRING PROVIDER: Carin Gauze, NP  REFERRING DIAG: left hip pain  THERAPY DIAG:  Other low back pain  Pain in left hip  Other abnormalities of gait and mobility  Rationale for Evaluation and Treatment: Rehabilitation  ONSET DATE: approximately 05/06/2024  SUBJECTIVE:   PERTINENT HISTORY: Patient is a 38 y.o. female who presents to outpatient physical therapy with a referral for medical diagnosis left hip pain. This patient's chief complaints consist of pain at the left iliac crest, leading  to the following functional deficits: sitting, standing, squatting, bending, stairs, walking over 15 minutes, reading, sleeping, caring for household. 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); Hyperlipidemia; Obesity; Other B-complex deficiencies; Other headache syndrome; Rash and other nonspecific skin eruption; Vitamin D  deficiency; Chronic pain syndrome; Disorder of skeletal system; Abnormal MRI, lumbar spine (07/15/2018); Chronic low back pain (1ry area of Pain) (Bilateral) (R>L) w/o sciatica; Lumbosacral facet syndrome (Bilateral); Chronic sacroiliac joint pain (Bilateral) (R>L); DDD (degenerative disc disease), lumbosacral; Morbid obesity with BMI of 40.0-44.9, adult (HCC); Levoscoliosis of lumbar spine; 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 she  has a past medical history of Anemia, Anxiety, Asthma, Depression, and Menorrhagia. she  has a past surgical history that includes Cesarean section (2012); plantar faciitis surgery R, and Tubal ligation. Patient denies hx of cancer, stroke, seizures, lung problems, heart problems, diabetes, unexplained weight loss, unexplained changes in bowel or bladder problems, unexplained stumbling or dropping things, osteoporosis, and spinal surgery  Exercise history: She has an exercise ball and will sit on it for 10-15 minutes a day. Walks the neighborhood with dogs 3x a day for 10 minutes.   SUBJECTIVE STATEMENT: Patient states her back pain is better today after the last 24 hours. She states it was pretty intense yesterday. She did her HEP yesterday. She is working on the breathing coordination.    PAIN: NPRS: Current: 5/10 in left posterior iliac crest region.    Best: 2/10, Worst: 8/10. Pain location: left iliac crest region, also has chronic pain in the low back central and pain in the anterior thighs (both sides equally painful). Denies symptom below knees. Paresthesia sometimes over the thighs.  Pain description: not sharp or dull, but noticeable Aggravating factors: sitting, standing, squatting, bending, stairs, walking, lying on left side Relieving factors: changing positions, heating pad,   FUNCTIONAL LIMITATIONS: sitting, standing, squatting, bending, stairs, walking over 15 minutes, reading, sleeping, caring for household.   LEISURE: reading  PRECAUTIONS: None  WEIGHT BEARING RESTRICTIONS: No  FALLS:  Has patient fallen in last 6 months? 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  Seated lumbar flexion 1x10  Lateral flexion with distraction at first point of pain: no change in pain  Review of activities at home that require L lateral flexion   Education on HEP including handout   Therapeutic activities: dynamic therapeutic activities incorporating MULTIPLE parameters or areas of the body designed to achieve improved functional performance.  Goblet squat with abdominal brace and rooting Several reps sit <> stand from low plinth trying to progress to squat while working on abdominal brace Then isolated rooting practice (hip ER with feet braced into floor)  Neuromuscular Re-education: a technique or exercise performed with the goal of improving the level of  communication between the body and the brain, such as for balance, motor control, muscle activation patterns, coordination, desensitization, quality of muscle contraction, proprioception, and/or kinesthetic sense needed for successful and safe completion of functional activities.   Seated diaphragmatic breathing: 1x10  Standing breathing with abdominals braced (difficulty - moved to hooklying)  Hooklying breathing with TrA activation, with self palpation of TrA after PT showed her how.  5x5 breaths  Prone multifidus kick with TrA contraction, with self palpation of TrA 1x10 each side after learning technique Pillow under pelvis  Squat to tap on low table with abdominals braced, glutes activated, and breathing coordinated (inhale on descent, exhale on rise).  1x10 AROM 1x10 goblet squat with 10#KB Added to HEP  Pt required multimodal cuing for proper technique and to facilitate improved neuromuscular control, strength, range of motion, and functional ability resulting in improved performance and form.  PATIENT EDUCATION:  Education details: Exercise purpose/form. Self management techniques. Person educated: Patient Education method: Explanation, Demonstration, and Handouts Education comprehension: verbalized understanding, returned demonstration, and needs further education  HOME EXERCISE PROGRAM: Access Code: [email or web address] URL: [email or web address] Date: 06/05/2024 Prepared by: Camie Cleverly  Exercises - Prone Press Up  - 2 x daily - 15 reps - 1 second hold - Neutral Curl Up with Straight Leg  - 1 x daily - 3 sets - 5 reps - 10 seconds hold - Bird Dog  - 1 x daily - 3 sets - 5 reps - 10 second hold - Side Plank on Knees  - 1 x daily - 3 sets - 5 reps - 10 seconds hold   ASSESSMENT:  CLINICAL IMPRESSION: Patient's pain persists in left low back and is worsened temporarily by left side-bending but without change overall during session. Her pain is slightly lower since  last PT session. She had no change in pain with lumbar traction when testing left sidebending. Today's session focused on deep core activation and coordination with limb movement, then functional movement. Patient found this challenging but made good progress. HEP updated to help her practice throughout the week. Patient would benefit from continued management of limiting condition by skilled physical therapist to address remaining impairments and functional limitations to work towards stated goals and return to PLOF or maximal functional independence.     From initial PT evaluation on 05/20/2024:  Patient is a 38 year old female referred to outpatient physical therapy with a medical diagnosis of left hip pain and presents with subacute onset (2 weeks) of left iliac crest/hip pain, superimposed on chronic low back pain with known history of L5-S1 disc protrusion and left S1 nerve root impingement. Current pain is localized to  the left iliac crest and lumbar paraspinals, worsened by static positions and transitional movements.  Pain behavior and mechanical pattern suggest lumbar spine and QL muscle involvement, possibly aggravated by altered loading from deconditioning, obesity.   No signs of true intra-articular hip pathology (FADDIR negative, FABER lateral pain), and no red flags were identified. Neurodynamic testing is unremarkable on the left.  Contributing Factors: Lumbar degenerative disc disease at L5-S1 with radicular potential Segmental hypomobility and myofascial dysfunction (QL, paraspinals) Chronic pain sensitization and sedentary lifestyle Obesity and postural adaptation (genu varus, Trendelenburg sign) Psychosocial stressors: depression, anxiety, caregiver burden  Specific Impairments: Pain with lumbar side flexion and rotation (L > R) Weakness: Left hip abductors, internal rotators Palpable tenderness: Left QL and paraspinals Limited SLS balance (likely related to motor control and  pain inhibition) Low LEFS score (32/80) indicating moderate to severe functional limitations  Patient will benefit from skilled physical therapy intervention to address current body structure impairments and activity limitations to improve function and work towards goals set in current POC in order to return to prior level of function or maximal functional improvement.    OBJECTIVE IMPAIRMENTS: decreased activity tolerance, decreased endurance, decreased knowledge of condition, decreased mobility, difficulty walking, decreased ROM, decreased strength, increased muscle spasms, impaired flexibility, improper body mechanics, postural dysfunction, obesity, and pain.   ACTIVITY LIMITATIONS: carrying, lifting, bending, sitting, standing, squatting, stairs, transfers, bed mobility, locomotion level, and caring for others  PARTICIPATION LIMITATIONS: cleaning, laundry, interpersonal relationship, shopping, community activity, and  sitting, standing, squatting, bending, stairs, walking over 15 minutes, reading, sleeping, caring for household  PERSONAL FACTORS: Past/current experiences and 3+ comorbidities:   are also affecting patient's functional outcome.   REHAB POTENTIAL: Good  CLINICAL DECISION MAKING: Evolving/moderate complexity  EVALUATION COMPLEXITY: Moderate   GOALS: Goals reviewed with patient? No  SHORT TERM GOALS: Target date: 06/03/2024  Patient will be independent with initial home exercise program for self-management of symptoms. Baseline: Initial HEP provided at IE (05/20/24); Goal status: In Progress  LONG TERM GOALS: Target date: 08/12/2024  Patient will be independent with a long-term home exercise program for self-management of symptoms.  Baseline: Initial HEP provided at IE (05/20/24); Goal status: In Progress  2.  Patient will demonstrate improved Lower Extremity Functional Scale (LEFS) to equal or greater than 64/80 to demonstrate improvement in overall condition and  self-reported functional ability.  Baseline: 32/80 (05/20/24); Goal status: In Progress  3.  Patient will tolerate 30 minutes of walking without increased pain.  Baseline: increased pain or has to stop after 15 min of walking (05/20/24); Goal status: In Progress  4.  Patient will demonstrate improvement in Patient Specific Functional Scale (PSFS) of equal or greater than 8/10 points to reflect clinically significant improvement in patient's most valued functional activities. Baseline: to be measured at visit 2 as appropriate (05/20/24); 5.7/10 (05/27/2024);  Goal status: In Progress  5.  Patient will report NPRS equal or less than 3/10 during functional activities during the last 2 weeks to improve their abilitly to complete community, work and/or recreational activities with less limitation. Baseline: 8/10 (05/20/24); Goal status: In Progress   PLAN:  PT FREQUENCY: 1-2x/week  PT DURATION: 8-12 weeks  PLANNED INTERVENTIONS: 97164- PT Re-evaluation, 97750- Physical Performance Testing, 97110-Therapeutic exercises, 97530- Therapeutic activity, 97112- Neuromuscular re-education, 97535- Self Care, 02859- Manual therapy, [phone]- Gait training, [phone]- Aquatic Therapy, [phone]- Electrical stimulation (unattended), [phone] (1-2 muscles), [phone] (3+ muscles)- Dry Needling, Patient/Family education, Balance training, Joint mobilization, Spinal mobilization, Cryotherapy, and Moist heat  PLAN FOR NEXT SESSION: update HEP as appropriate, QL stretch, manual therapy, progressive core/LE/Balance exercises, hip strengthening. Education. Walking program.    Camie SAUNDERS. Juli, PT, DPT 06/19/24, 7:20 PM  [facility] [address] [place], [place] [address] P: [phone] I F: [phone]

## 2024-06-24 ENCOUNTER — Encounter: Payer: Self-pay | Admitting: Physical Therapy

## 2024-06-24 ENCOUNTER — Ambulatory Visit: Admitting: Physical Therapy

## 2024-06-24 DIAGNOSIS — M25552 Pain in left hip: Secondary | ICD-10-CM

## 2024-06-24 DIAGNOSIS — M5459 Other low back pain: Secondary | ICD-10-CM

## 2024-06-24 DIAGNOSIS — R2689 Other abnormalities of gait and mobility: Secondary | ICD-10-CM

## 2024-06-24 NOTE — Therapy (Signed)
 OUTPATIENT PHYSICAL THERAPY TREATMENT   Patient Name: Cathy Trevino MRN: [id number] DOB:04/30/1986, 38 y.o., female Today's Date: 06/24/2024  END OF SESSION:  PT End of Session - 06/24/24 1622     Visit Number 8    Number of Visits 17    Date for PT Re-Evaluation 08/12/24    Authorization Type [place] [employer] reporting period from 05/20/2024    Authorization Time Period [employer] auth#[id number] for 10 PT vst from 6/9-8/8    Authorization - Visit Number 8    Authorization - Number of Visits 10    Progress Note Due on Visit 10    PT Start Time 1608    PT Stop Time 1646    PT Time Calculation (min) 38 min    Activity Tolerance Patient tolerated treatment well    Behavior During Therapy [facility] for tasks assessed/performed             Past Medical History:  Diagnosis Date   Anemia    Anxiety    Asthma    Depression    Menorrhagia    Past Surgical History:  Procedure Laterality Date   CESAREAN SECTION  2012   TUBAL LIGATION     Patient Active Problem List   Diagnosis Date Noted   Gastroesophageal reflux disease without esophagitis 05/29/2024   Pain of upper abdomen 05/14/2024   Left hip pain 05/14/2024   MDD (major depressive disorder), recurrent episode, moderate (HCC) 10/02/2022   Current moderate episode of major depressive disorder without prior episode (HCC) 09/06/2022   Bereavement 09/06/2022   Anxiety disorder 09/06/2022   Osteoarthritis of facet joint of lumbar spine 04/13/2021   Spondylosis without myelopathy or radiculopathy, lumbosacral region 04/13/2021   Abnormal MRI, lumbar spine (07/15/2018) 03/09/2021   Chronic low back pain (1ry area of Pain) (Bilateral) (R>L) w/o sciatica 03/09/2021   Lumbosacral facet syndrome (Bilateral) 03/09/2021   Chronic sacroiliac joint pain (Bilateral) (R>L) 03/09/2021   DDD (degenerative disc disease), lumbosacral 03/09/2021   Morbid obesity with BMI of 40.0-44.9, adult (HCC) 03/09/2021   Levoscoliosis of lumbar  spine 03/09/2021   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) 03/09/2021   Allergic rhinitis due to animal (cat) (dog) hair and dander 01/30/2021   Allergic rhinitis due to pollen 01/30/2021   Rash and other nonspecific skin eruption 01/30/2021   Chronic pain syndrome 01/30/2021   Pharmacologic therapy 01/30/2021   Disorder of skeletal system 01/30/2021   Problems influencing health status 01/30/2021   Varicose veins of leg with pain, bilateral 08/05/2019   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) 10/17/2018   Hyperlipidemia 10/17/2018   Other B-complex deficiencies 05/23/2018   Vitamin D  deficiency 05/23/2018   Menorrhagia with regular cycle 03/11/2018   Microcytic anemia 02/25/2018   Iron deficiency anemia due to chronic blood loss 02/18/2018   Chest pain 12/21/2015   Obesity 11/25/2014   Allergic rhinitis 05/21/2014   Dermatitis 10/23/2013   Other headache syndrome 06/11/2013    PCP: Carin Gauze, NP  REFERRING PROVIDER: Carin Gauze, NP  REFERRING DIAG: left hip pain  THERAPY DIAG:  Other low back pain  Pain in left hip  Other abnormalities of gait and mobility  Rationale for Evaluation and Treatment: Rehabilitation  ONSET DATE: approximately 05/06/2024  SUBJECTIVE:   PERTINENT HISTORY: Patient is a 38 y.o. female who presents to outpatient physical therapy with a referral for medical diagnosis left hip pain. This patient's chief complaints consist of pain at the left iliac crest,  leading to the following functional deficits: sitting, standing, squatting, bending, stairs, walking over 15 minutes, reading, sleeping, caring for household. 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); Hyperlipidemia; Obesity; Other B-complex deficiencies; Other headache syndrome; Rash and other nonspecific skin eruption; Vitamin D  deficiency; Chronic pain syndrome; Disorder of skeletal system; Abnormal MRI, lumbar spine (07/15/2018); Chronic low back pain (1ry area of Pain) (Bilateral) (R>L) w/o sciatica; Lumbosacral facet syndrome (Bilateral); Chronic sacroiliac joint pain (Bilateral) (R>L); DDD (degenerative disc disease), lumbosacral; Morbid obesity with BMI of 40.0-44.9, adult (HCC); Levoscoliosis of lumbar spine; 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 she  has a past medical history of Anemia, Anxiety, Asthma, Depression, and Menorrhagia. she  has a past surgical history that includes Cesarean section (2012); plantar faciitis surgery R, and Tubal ligation. Patient denies hx of cancer, stroke, seizures, lung problems, heart problems, diabetes, unexplained weight loss, unexplained changes in bowel or bladder problems, unexplained stumbling or dropping things, osteoporosis, and spinal surgery  Exercise history: She has an exercise ball and will sit on it for 10-15 minutes a day. Walks the neighborhood with dogs 3x a day for 10 minutes.   SUBJECTIVE STATEMENT: Patient states she is here. She states her quads were sore after last PT session and it went away the next day.   PAIN: NPRS: Current: 6/10 in left posterior iliac crest region.   Best: 2/10, Worst: 8/10. Pain location: left iliac crest region, also  has chronic pain in the low back central and pain in the anterior thighs (both sides equally painful). Denies symptom below knees. Paresthesia sometimes over the thighs.  Pain description: not sharp or dull, but noticeable Aggravating factors: sitting, standing, squatting, bending, stairs, walking, lying on left side Relieving factors: changing positions, heating pad,   FUNCTIONAL LIMITATIONS: sitting, standing, squatting, bending, stairs, walking over 15 minutes, reading, sleeping, caring for household.   LEISURE: reading  PRECAUTIONS: None  WEIGHT BEARING RESTRICTIONS: No  FALLS:  Has patient fallen in last 6 months? 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 SIDELYING STM to L QL with sustained pressure. Very uncomfortable during, palpable tension with some relaxation with pressure. No change after.    Therapeutic exercise: therapeutic exercises that incorporate ONE parameter at one or more areas of the body to centralize symptoms, develop strength and endurance, range of motion, and flexibility required for successful completion of functional activities.  Seated lumbar flexion with hands behind ankles 1x10   Neuromuscular Re-education: a technique or exercise performed with the goal of improving the level of communication between the body and the brain, such as for balance, motor control, muscle activation patterns, coordination, desensitization, quality of muscle contraction, proprioception, and/or kinesthetic sense needed for successful and safe completion of functional activities.   Hooklying anterior  and posterior pelvic tilt (PPT) AROM focusing on finding pain limited range 1x finding ROM  Hooklying partial PPT hold with lat pull over with 8.8 bar 1x10  pretty easy  Quadruped pelvic tilt AROM  Practiced with cuing until able to do some Pt reports anxiety about sticking buttocks up  Hooklying partial PPT with alternating hip extension Several reps then progressed to 10 second holds Towel roll under wrists to decrease pain there.   Goblet squat to touch on low plinth with brace and breathing 1x10 with abdominal brace with 10#KB Worked on getting rooting technique 2x10 AROM with rooting technique 2x10 with 10#KB, breathing, and rooting technique (last 5 best she has been able to do yet).   Pt required multimodal cuing for proper technique and to facilitate improved neuromuscular control, strength, range of motion, and functional ability resulting in improved performance and form.  PATIENT EDUCATION:  Education details: Exercise purpose/form. Self management techniques. Person educated: Patient Education method: Explanation, Demonstration, and Handouts Education comprehension: verbalized understanding, returned demonstration, and needs further education  HOME EXERCISE PROGRAM: Access Code: [email or web address] URL: [email or web address] Date: 06/05/2024 Prepared by: Camie Cleverly  Exercises - Prone Press Up  - 2 x daily - 15 reps - 1 second hold - Neutral Curl Up with Straight Leg  - 1 x daily - 3 sets - 5 reps - 10 seconds hold - Bird Dog  - 1 x daily - 3 sets - 5 reps - 10 second hold - Side Plank on Knees  - 1 x daily - 3 sets - 5 reps - 10 seconds hold   ASSESSMENT:  CLINICAL IMPRESSION: Patient's left sided low back pain persists despite exercise and manual interventions. She made progress today in motor control for bracing during squats and to prevent excessive movement of the low back, which may improve her pain in the long term. Patient would benefit from  continued management of limiting condition by skilled physical therapist to address remaining impairments and functional limitations to work towards stated goals and return to PLOF or maximal functional independence.   From initial PT evaluation on 05/20/2024:  Patient is a 38 year old female referred to outpatient physical therapy with a medical diagnosis of left hip pain and presents with subacute onset (2 weeks) of left iliac crest/hip pain, superimposed on chronic low back pain with known history of L5-S1 disc protrusion and left S1 nerve root impingement. Current pain is localized to the left iliac crest and lumbar paraspinals, worsened by static positions and transitional movements.  Pain behavior and mechanical pattern suggest lumbar spine and QL muscle involvement, possibly aggravated by altered loading from deconditioning, obesity.   No signs of true intra-articular hip pathology (  FADDIR negative, FABER lateral pain), and no red flags were identified. Neurodynamic testing is unremarkable on the left.  Contributing Factors: Lumbar degenerative disc disease at L5-S1 with radicular potential Segmental hypomobility and myofascial dysfunction (QL, paraspinals) Chronic pain sensitization and sedentary lifestyle Obesity and postural adaptation (genu varus, Trendelenburg sign) Psychosocial stressors: depression, anxiety, caregiver burden  Specific Impairments: Pain with lumbar side flexion and rotation (L > R) Weakness: Left hip abductors, internal rotators Palpable tenderness: Left QL and paraspinals Limited SLS balance (likely related to motor control and pain inhibition) Low LEFS score (32/80) indicating moderate to severe functional limitations  Patient will benefit from skilled physical therapy intervention to address current body structure impairments and activity limitations to improve function and work towards goals set in current POC in order to return to prior level of function or  maximal functional improvement.    OBJECTIVE IMPAIRMENTS: decreased activity tolerance, decreased endurance, decreased knowledge of condition, decreased mobility, difficulty walking, decreased ROM, decreased strength, increased muscle spasms, impaired flexibility, improper body mechanics, postural dysfunction, obesity, and pain.   ACTIVITY LIMITATIONS: carrying, lifting, bending, sitting, standing, squatting, stairs, transfers, bed mobility, locomotion level, and caring for others  PARTICIPATION LIMITATIONS: cleaning, laundry, interpersonal relationship, shopping, community activity, and  sitting, standing, squatting, bending, stairs, walking over 15 minutes, reading, sleeping, caring for household  PERSONAL FACTORS: Past/current experiences and 3+ comorbidities:   are also affecting patient's functional outcome.   REHAB POTENTIAL: Good  CLINICAL DECISION MAKING: Evolving/moderate complexity  EVALUATION COMPLEXITY: Moderate   GOALS: Goals reviewed with patient? No  SHORT TERM GOALS: Target date: 06/03/2024  Patient will be independent with initial home exercise program for self-management of symptoms. Baseline: Initial HEP provided at IE (05/20/24); Goal status: In Progress  LONG TERM GOALS: Target date: 08/12/2024  Patient will be independent with a long-term home exercise program for self-management of symptoms.  Baseline: Initial HEP provided at IE (05/20/24); Goal status: In Progress  2.  Patient will demonstrate improved Lower Extremity Functional Scale (LEFS) to equal or greater than 64/80 to demonstrate improvement in overall condition and self-reported functional ability.  Baseline: 32/80 (05/20/24); Goal status: In Progress  3.  Patient will tolerate 30 minutes of walking without increased pain.  Baseline: increased pain or has to stop after 15 min of walking (05/20/24); Goal status: In Progress  4.  Patient will demonstrate improvement in Patient Specific Functional  Scale (PSFS) of equal or greater than 8/10 points to reflect clinically significant improvement in patient's most valued functional activities. Baseline: to be measured at visit 2 as appropriate (05/20/24); 5.7/10 (05/27/2024);  Goal status: In Progress  5.  Patient will report NPRS equal or less than 3/10 during functional activities during the last 2 weeks to improve their abilitly to complete community, work and/or recreational activities with less limitation. Baseline: 8/10 (05/20/24); Goal status: In Progress   PLAN:  PT FREQUENCY: 1-2x/week  PT DURATION: 8-12 weeks  PLANNED INTERVENTIONS: 97164- PT Re-evaluation, 97750- Physical Performance Testing, 97110-Therapeutic exercises, 97530- Therapeutic activity, [id number]- Neuromuscular re-education, 97535- Self Care, 02859- Manual therapy, [phone]- Gait training, [phone]- Aquatic Therapy, [phone]- Electrical stimulation (unattended), 20560 (1-2 muscles), 20561 (3+ muscles)- Dry Needling, Patient/Family education, Balance training, Joint mobilization, Spinal mobilization, Cryotherapy, and Moist heat  PLAN FOR NEXT SESSION: update HEP as appropriate, QL stretch, manual therapy, progressive core/LE/Balance exercises, hip strengthening. Education. Walking program.    Camie SAUNDERS. Juli, PT, DPT 06/24/24, 8:01 PM  [facility] [address] [place],  [place] [address] P: [phone] I F: [phone]

## 2024-06-26 ENCOUNTER — Ambulatory Visit: Admitting: Physical Therapy

## 2024-06-27 ENCOUNTER — Encounter: Payer: Self-pay | Admitting: Cardiology

## 2024-06-27 ENCOUNTER — Ambulatory Visit: Admitting: Cardiology

## 2024-06-27 VITALS — BP 116/68 | HR 98 | Ht 64.0 in | Wt 254.6 lb

## 2024-06-27 DIAGNOSIS — L247 Irritant contact dermatitis due to plants, except food: Secondary | ICD-10-CM | POA: Insufficient documentation

## 2024-06-27 DIAGNOSIS — Z0130 Encounter for examination of blood pressure without abnormal findings: Secondary | ICD-10-CM

## 2024-06-27 MED ORDER — TRIAMCINOLONE ACETONIDE 0.1 % EX CREA: 1.0000 | TOPICAL_CREAM | Freq: Two times a day (BID) | CUTANEOUS | 0 refills | Status: AC

## 2024-06-27 MED ORDER — PREDNISONE 20 MG PO TABS
ORAL_TABLET | ORAL | 0 refills | Status: DC
Start: 2024-06-27 — End: 2024-08-29

## 2024-06-27 NOTE — Progress Notes (Signed)
 Established Patient Office Visit  Subjective:  Patient ID: Cathy Trevino, female    DOB: [birth year]  Age: 38 y.o. MRN: [id number]  Chief Complaint  Patient presents with   Acute Visit    Rash on arms and fingers started on Monday claims no change in day to day life.     Patient in office for an acute visit, complains of a rash on her arms and fingers. Started 5 days ago. Rash is diffuse on chest, back, arms and between fingers. Has tried her son's triamcinolone  cream with no change. No new soaps or detergents. Reports she did cut some grass/weeds earlier in the week. Will send in prednisone , continue triamcinolone  cream.   Rash This is a new problem. The current episode started in the past 7 days. The problem has been gradually worsening since onset. The affected locations include the abdomen, face, left fingers, back, torso, chest and right fingers. The rash is characterized by dryness, itchiness, redness and scaling. She was exposed to plant contact. Pertinent negatives include no diarrhea, joint pain or shortness of breath. Past treatments include analgesics and antibiotic cream. The treatment provided no relief.    No other concerns at this time.   Past Medical History:  Diagnosis Date   Anemia    Anxiety    Asthma    Depression    Menorrhagia     Past Surgical History:  Procedure Laterality Date   CESAREAN SECTION  2012   TUBAL LIGATION      Social History   Socioeconomic History   Marital status: Divorced    Spouse name: Not on file   Number of children: 3   Years of education: Not on file   Highest education level: Some college, no degree  Occupational History   Not on file  Tobacco Use   Smoking status: Former    Current packs/day: 0.00    Average packs/day: 0.3 packs/day for 5.0 years (1.3 ttl pk-yrs)    Types: Cigarettes    Start date: 02/22/2011    Quit date: 02/22/2016    Years since quitting: 8.3   Smokeless tobacco: Never  Vaping Use   Vaping  status: Never Used  Substance and Sexual Activity   Alcohol use: No   Drug use: No   Sexual activity: Yes    Birth control/protection: I.U.D.  Other Topics Concern   Not on file  Social History Narrative   Not on file   Social Drivers of Health   Financial Resource Strain: Not on file  Food Insecurity: Not on file  Transportation Needs: Not on file  Physical Activity: Sufficiently Active (11/04/2018)   Exercise Vital Sign    Days of Exercise per Week: 7 days    Minutes of Exercise per Session: 40 min  Stress: Not on file  Social Connections: Not on file  Intimate Partner Violence: Not on file    Family History  Problem Relation Age of Onset   Heart failure Mother    Hypertension Mother    COPD Mother    Alcohol abuse Father    Depression Father    COPD Father    Alcohol abuse Brother    Schizophrenia Brother    ADD / ADHD Daughter    Autism spectrum disorder Son    Autism spectrum disorder Son     Allergies  Allergen Reactions   Other     Seasonal allergies   Penicillins Rash    Outpatient Medications Prior to Visit  Medication Sig   ascorbic acid (VITAMIN C) 500 MG tablet Take 500 mg by mouth every other day.   cetirizine  (ZYRTEC ) 10 MG tablet Take 1 tablet (10 mg total) by mouth at bedtime.   citalopram  (CELEXA ) 10 MG tablet Take 1 tablet (10 mg total) by mouth daily.   ergocalciferol  (VITAMIN D2) 1.25 MG (50000 UT) capsule Take 1 capsule (50,000 Units total) by mouth once a week.   fluticasone  (FLONASE ) 50 MCG/ACT nasal spray Place 1 spray into both nostrils daily.   hydrOXYzine  (VISTARIL ) 25 MG capsule Take 1 capsule (25 mg total) by mouth 2 (two) times daily as needed for anxiety.   meloxicam  (MOBIC ) 15 MG tablet Take 1 tablet (15 mg total) by mouth daily.   Multiple Vitamin (MULTIVITAMIN ADULT PO) Take 1 tablet by mouth daily at 8 pm.   nystatin  (MYCOSTATIN /NYSTOP ) powder Apply 1 Application topically 3 (three) times daily.   rosuvastatin  (CRESTOR ) 20  MG tablet Take 1 tablet (20 mg total) by mouth at bedtime.   acetaminophen  (TYLENOL ) 500 MG tablet Take 500 mg by mouth every 6 (six) hours as needed. (Patient not taking: Reported on 06/27/2024)   pantoprazole  (PROTONIX ) 20 MG tablet Take 1 tablet (20 mg total) by mouth daily. (Patient not taking: Reported on 06/27/2024)   [DISCONTINUED] vitamin B-12 (CYANOCOBALAMIN) 100 MCG tablet Take 100 mcg by mouth daily. (Patient not taking: Reported on 06/27/2024)   No facility-administered medications prior to visit.    Review of Systems  Constitutional: Negative.   HENT: Negative.    Eyes: Negative.   Respiratory: Negative.  Negative for shortness of breath.   Cardiovascular: Negative.  Negative for chest pain.  Gastrointestinal: Negative.  Negative for abdominal pain, constipation and diarrhea.  Genitourinary: Negative.   Musculoskeletal:  Negative for joint pain and myalgias.  Skin:  Positive for itching and rash.  Neurological: Negative.  Negative for dizziness and headaches.  Endo/Heme/Allergies: Negative.   All other systems reviewed and are negative.      Objective:   BP 116/68   Pulse 98   Ht 5' 4 (1.626 m)   Wt 254 lb 9.6 oz (115.5 kg)   SpO2 98%   BMI 43.70 kg/m   Vitals:   06/27/24 1042  BP: 116/68  Pulse: 98  Height: 5' 4 (1.626 m)  Weight: 254 lb 9.6 oz (115.5 kg)  SpO2: 98%  BMI (Calculated): 43.68    Physical Exam Vitals and nursing note reviewed.  Constitutional:      Appearance: Normal appearance. She is normal weight.  HENT:     Head: Normocephalic and atraumatic.     Nose: Nose normal.     Mouth/Throat:     Mouth: Mucous membranes are moist.  Eyes:     Extraocular Movements: Extraocular movements intact.     Conjunctiva/sclera: Conjunctivae normal.     Pupils: Pupils are equal, round, and reactive to light.  Cardiovascular:     Rate and Rhythm: Normal rate and regular rhythm.     Pulses: Normal pulses.     Heart sounds: Normal heart sounds.   Pulmonary:     Effort: Pulmonary effort is normal.     Breath sounds: Normal breath sounds.  Abdominal:     General: Abdomen is flat. Bowel sounds are normal.     Palpations: Abdomen is soft.  Musculoskeletal:        General: Normal range of motion.     Cervical back: Normal range of motion.  Skin:  General: Skin is warm and dry.  Neurological:     General: No focal deficit present.     Mental Status: She is alert and oriented to person, place, and time.  Psychiatric:        Mood and Affect: Mood normal.        Behavior: Behavior normal.        Thought Content: Thought content normal.        Judgment: Judgment normal.      No results found for any visits on 06/27/24.  Recent Results (from the past 2160 hours)  Hemoglobin A1c     Status: None   Collection Time: 05/27/24 11:44 AM  Result Value Ref Range   Hgb A1c MFr Bld 5.3 4.8 - 5.6 %    Comment:          Prediabetes: 5.7 - 6.4          Diabetes: >6.4          Glycemic control for adults with diabetes: <7.0    Est. average glucose Bld gHb Est-mCnc 105 mg/dL  TSH     Status: None   Collection Time: 05/27/24 11:44 AM  Result Value Ref Range   TSH 1.560 0.450 - 4.500 uIU/mL  CMP14+EGFR     Status: Abnormal   Collection Time: 05/27/24 11:44 AM  Result Value Ref Range   Glucose 86 70 - 99 mg/dL   BUN 13 6 - 20 mg/dL   Creatinine, Ser 9.32 0.57 - 1.00 mg/dL   eGFR 885 >40 fO/fpw/8.26   BUN/Creatinine Ratio 19 9 - 23   Sodium 142 134 - 144 mmol/L   Potassium 4.4 3.5 - 5.2 mmol/L   Chloride 105 96 - 106 mmol/L   CO2 23 20 - 29 mmol/L   Calcium  8.6 (L) 8.7 - 10.2 mg/dL   Total Protein 5.8 (L) 6.0 - 8.5 g/dL   Albumin 3.8 (L) 3.9 - 4.9 g/dL   Globulin, Total 2.0 1.5 - 4.5 g/dL   Bilirubin Total 0.4 0.0 - 1.2 mg/dL   Alkaline Phosphatase 52 44 - 121 IU/L   AST 20 0 - 40 IU/L   ALT 19 0 - 32 IU/L  Lipid panel     Status: Abnormal   Collection Time: 05/27/24 11:44 AM  Result Value Ref Range   Cholesterol, Total  113 100 - 199 mg/dL   Triglycerides 899 0 - 149 mg/dL   HDL 36 (L) >60 mg/dL   VLDL Cholesterol Cal 19 5 - 40 mg/dL   LDL Chol Calc (NIH) 58 0 - 99 mg/dL   Chol/HDL Ratio 3.1 0.0 - 4.4 ratio    Comment:                                   T. Chol/HDL Ratio                                             Men  Women                               1/2 Avg.Risk  3.4    3.3  Avg.Risk  5.0    4.4                                2X Avg.Risk  9.6    7.1                                3X Avg.Risk 23.4   11.0       Assessment & Plan:  Prednisone  Triamcinolone  cream  Problem List Items Addressed This Visit       Musculoskeletal and Integument   Irritant contact dermatitis due to plants, except food - Primary    Return if symptoms worsen or fail to improve.   Total time spent: 25 minutes  [employer], NP  06/27/2024   This document may have been prepared by Dragon Voice Recognition software and as such may include unintentional dictation errors.

## 2024-07-01 ENCOUNTER — Ambulatory Visit

## 2024-07-01 DIAGNOSIS — M5459 Other low back pain: Secondary | ICD-10-CM | POA: Diagnosis not present

## 2024-07-01 DIAGNOSIS — R2689 Other abnormalities of gait and mobility: Secondary | ICD-10-CM

## 2024-07-01 DIAGNOSIS — M25552 Pain in left hip: Secondary | ICD-10-CM

## 2024-07-01 NOTE — Therapy (Signed)
 OUTPATIENT PHYSICAL THERAPY TREATMENT   Patient Name: Cathy Trevino MRN: [id number] DOB:25-Jun-1986, 38 y.o., female Today's Date: 07/01/2024  END OF SESSION:  PT End of Session - 07/01/24 1603     Visit Number 9    Number of Visits 17    Date for PT Re-Evaluation 08/12/24    Authorization Type [place] MEDICA[employer] reporting period from 05/20/2024    Authorization Time Period [employer] auth#[id number] for 10 PT vst from 6/9-8/8    Progress Note Due on Visit 10    PT Start Time 1600    PT Stop Time 1640    PT Time Calculation (min) 40 min    Activity Tolerance Patient tolerated treatment well    Behavior During Therapy [facility] for tasks assessed/performed             Past Medical History:  Diagnosis Date   Anemia    Anxiety    Asthma    Depression    Menorrhagia    Past Surgical History:  Procedure Laterality Date   CESAREAN SECTION  2012   TUBAL LIGATION     Patient Active Problem List   Diagnosis Date Noted   Irritant contact dermatitis due to plants, except food 06/27/2024   Gastroesophageal reflux disease without esophagitis 05/29/2024   Pain of upper abdomen 05/14/2024   Left hip pain 05/14/2024   MDD (major depressive disorder), recurrent episode, moderate (HCC) 10/02/2022   Current moderate episode of major depressive disorder without prior episode (HCC) 09/06/2022   Bereavement 09/06/2022   Anxiety disorder 09/06/2022   Osteoarthritis of facet joint of lumbar spine 04/13/2021   Spondylosis without myelopathy or radiculopathy, lumbosacral region 04/13/2021   Abnormal MRI, lumbar spine (07/15/2018) 03/09/2021   Chronic low back pain (1ry area of Pain) (Bilateral) (R>L) w/o sciatica 03/09/2021   Lumbosacral facet syndrome (Bilateral) 03/09/2021   Chronic sacroiliac joint pain (Bilateral) (R>L) 03/09/2021   DDD (degenerative disc disease), lumbosacral 03/09/2021   Morbid obesity with BMI of 40.0-44.9, adult (HCC) 03/09/2021   Levoscoliosis of lumbar spine  03/09/2021   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) 03/09/2021   Allergic rhinitis due to animal (cat) (dog) hair and dander 01/30/2021   Allergic rhinitis due to pollen 01/30/2021   Rash and other nonspecific skin eruption 01/30/2021   Chronic pain syndrome 01/30/2021   Pharmacologic therapy 01/30/2021   Disorder of skeletal system 01/30/2021   Problems influencing health status 01/30/2021   Varicose veins of leg with pain, bilateral 08/05/2019   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) 10/17/2018   Hyperlipidemia 10/17/2018   Other B-complex deficiencies 05/23/2018   Vitamin D  deficiency 05/23/2018   Menorrhagia with regular cycle 03/11/2018   Microcytic anemia 02/25/2018   Iron deficiency anemia due to chronic blood loss 02/18/2018   Chest pain 12/21/2015   Obesity 11/25/2014   Allergic rhinitis 05/21/2014   Dermatitis 10/23/2013   Other headache syndrome 06/11/2013    PCP: Carin Gauze, NP  REFERRING PROVIDER: Carin Gauze, NP  REFERRING DIAG: left hip pain  THERAPY DIAG:  Other low back pain  Pain in left hip  Other abnormalities of gait and mobility  Rationale for Evaluation and Treatment: Rehabilitation  ONSET DATE: approximately 05/06/2024  SUBJECTIVE:   PERTINENT HISTORY: Patient is a 38 y.o. female who presents to outpatient physical therapy with a referral for medical diagnosis left hip pain. This patient's chief complaints consist of pain at the left iliac crest, leading to the following functional deficits:  sitting, standing, squatting, bending, stairs, walking over 15 minutes, reading, sleeping, caring for household. 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); Hyperlipidemia; Obesity; Other B-complex deficiencies; Other headache syndrome; Rash and other nonspecific skin eruption; Vitamin D  deficiency; Chronic pain syndrome; Disorder of skeletal system; Abnormal MRI, lumbar spine (07/15/2018); Chronic low back pain (1ry area of Pain) (Bilateral) (R>L) w/o sciatica; Lumbosacral facet syndrome (Bilateral); Chronic sacroiliac joint pain (Bilateral) (R>L); DDD (degenerative disc disease), lumbosacral; Morbid obesity with BMI of 40.0-44.9, adult (HCC); Levoscoliosis of lumbar spine; 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 she  has a past medical history of Anemia, Anxiety, Asthma, Depression, and Menorrhagia. she  has a past surgical history that includes Cesarean section (2012); plantar faciitis surgery R, and Tubal ligation. Patient denies hx of cancer, stroke, seizures, lung problems, heart problems, diabetes, unexplained weight loss, unexplained changes in bowel or bladder problems, unexplained stumbling or dropping things, osteoporosis, and spinal surgery  Exercise history: She has an exercise ball and will sit on it for 10-15 minutes a day. Walks the neighborhood with dogs 3x a day for 10 minutes.   SUBJECTIVE STATEMENT: Patient states she is here. She states her quads were sore after last PT session and it went away the next day.   PAIN: NPRS: Current: 6/10 in left posterior iliac crest region.   Best: 2/10, Worst: 8/10. Pain location: left iliac crest region, also  has chronic pain in the low back central and pain in the anterior thighs (both sides equally painful). Denies symptom below knees. Paresthesia sometimes over the thighs.  Pain description: not sharp or dull, but noticeable Aggravating factors: sitting, standing, squatting, bending, stairs, walking, lying on left side Relieving factors: changing positions, heating pad,   FUNCTIONAL LIMITATIONS: sitting, standing, squatting, bending, stairs, walking over 15 minutes, reading, sleeping, caring for household.   LEISURE: reading  PRECAUTIONS: None  WEIGHT BEARING RESTRICTIONS: No  FALLS:  Has patient fallen in last 6 months? 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  PLOF: Independent  PATIENT GOALS: keep myself mobile  OBJECTIVE                                                                                                                             TREATMENT -LEFS (see goals)  -LT goals assessment/review  - MMT:  *Indicates pain 05/21/24 06/21/24  Joint/Motion R/L R/L  Hip Flexion  4/5 5/5  Hip Extension (knee ext) 4+/4 5/5*  Hip Extension (knee flex) / 5/5  Hip Abduction 5/4* 5/4+  Hip Horizontal ABDCT  5/5  Hip Adduction / /  Hip Horizontal ADD  5/5  Hip External rotation 4/4 5/5  Hip Internal rotation  3/3 5/5  Knee Ext   5/5  Knee flexion   5/5  Comments:    -manual trigger point release Left gluteus medius:   2 posterior 2 lateral all tender, but only those approached from direct lateral given immediate release   -Supine Left FABER stretch 2x60sec using table features to achieve relevant hip flexion angle   -Hooklying bridge + blue ball squeeze x12  -Hooklying band clam 1x15 GTB  -Green band hooklying marching, alterante x30  -seated yellowTB reverse clam 1x15  -blue ball double curlup (hooklying) 1x15 (starting 1/3 the way up)  -lateral side stepping 1x70ft bilat @ 5lb AW bilat (ran out of time here)     PATIENT EDUCATION:  Education details:  Exercise purpose/form. Self management techniques. Person educated: Patient Education method: Explanation, Demonstration, and Handouts Education comprehension: verbalized understanding, returned demonstration, and needs further education  HOME EXERCISE PROGRAM: Access Code: [email or web address] URL: [email or web address] Date: 06/05/2024 Prepared by: Camie Cleverly  Exercises - Prone Press Up  - 2 x daily - 15 reps - 1 second hold - Neutral Curl Up with Straight Leg  - 1 x daily - 3 sets - 5 reps - 10 seconds hold - Bird Dog  - 1 x daily - 3 sets - 5 reps - 10 second hold - Side Plank on Knees  - 1 x daily - 3 sets - 5 reps - 10 seconds hold   ASSESSMENT:  CLINICAL IMPRESSION: Began reassessmen tof measures, despite contiued high pain levels, pt does show signs of improved self report and strength testing. Good response to myofascial release of gluteus medius. Pt does appear to have 2ndcondary back pain symptoms in session that are limiting at times. Patient will benefit from skilled physical therapy intervention to reduce deficits and impairments identified in evaluation, in order to reduce pain, improve quality of life, and maximize activity tolerance for ADL, IADL, and leisure/fitness. Physical therapy will help pt achieve long and short term goals of care.    OBJECTIVE IMPAIRMENTS: decreased activity tolerance, decreased endurance, decreased knowledge of condition, decreased mobility, difficulty walking, decreased ROM, decreased strength, increased muscle spasms, impaired flexibility, improper body mechanics, postural dysfunction, obesity, and pain.   ACTIVITY LIMITATIONS: carrying, lifting, bending, sitting, standing, squatting, stairs, transfers, bed mobility, locomotion level, and caring for others  PARTICIPATION LIMITATIONS: cleaning, laundry, interpersonal relationship, shopping, community activity, and  sitting, standing, squatting, bending, stairs, walking over 15 minutes,  reading, sleeping, caring for household  PERSONAL FACTORS: Past/current experiences and 3+ comorbidities:   are also affecting patient's functional outcome.   REHAB POTENTIAL: Good  CLINICAL DECISION MAKING: Evolving/moderate complexity  EVALUATION COMPLEXITY: Moderate   GOALS: Goals reviewed with patient? No  SHORT TERM GOALS: Target date: 06/03/2024  Patient will be independent with initial home exercise program for self-management of symptoms. Baseline: Initial HEP provided at IE (05/20/24); 07/01/24: has been consistent and compliant  Goal status: ACHIEVED  LONG TERM GOALS: Target date: 08/12/2024  Patient will be independent with a long-term home exercise program for self-management of symptoms.  Baseline: Initial HEP provided at IE (05/20/24); Goal status: In Progress  2.  Patient will demonstrate improved Lower Extremity Functional Scale (LEFS) to equal or greater  than 64/80 to demonstrate improvement in overall condition and self-reported functional ability.  Baseline: 32/80 (05/20/24); 37/80 (07/01/24)  Goal status: In Progress  3.  Patient will tolerate 30 minutes of walking without increased pain.  Baseline: increased pain or has to stop after 15 min of walking (05/20/24); unchanged since previous (07/01/24).  Goal status: In Progress  4.  Patient will demonstrate improvement in Patient Specific Functional Scale (PSFS) of equal or greater than 8/10 points to reflect clinically significant improvement in patient's most valued functional activities. Baseline: to be measured at visit 2 as appropriate (05/20/24); 5.7/10 (05/27/2024);  Goal status: In Progress  5.  Patient will report NPRS equal or less than 3/10 during functional activities during the last 2 weeks to improve their abilitly to complete community, work and/or recreational activities with less limitation. Baseline: 8/10 (05/20/24); 5-7/10 (07/01/24) over preceding 2 week.  Goal status: In Progress  PLAN:  PT  FREQUENCY: 1-2x/week PT DURATION: 8-12 weeks PLANNED INTERVENTIONS: 97164- PT Re-evaluation, 97750- Physical Performance Testing, 97110-Therapeutic exercises, 97530- Therapeutic activity, [id number]- Neuromuscular re-education, 97535- Self Care, [id number]- Manual therapy, [phone]- Gait training, [phone]- Aquatic Therapy, [phone]- Electrical stimulation (unattended), 20560 (1-2 muscles), 20561 (3+ muscles)- Dry Needling, Patient/Family education, Balance training, Joint mobilization, Spinal mobilization, Cryotherapy, and Moist heat  PLAN FOR NEXT SESSION: update HEP as appropriate, QL stretch, manual therapy, progressive core/LE/Balance exercises, hip strengthening. Education. Walking program.   4:06 PM, 07/01/24 Peggye JAYSON Linear, PT, DPT Physical Therapist - [place] [phone] (Office)

## 2024-07-03 ENCOUNTER — Ambulatory Visit

## 2024-07-03 DIAGNOSIS — R2689 Other abnormalities of gait and mobility: Secondary | ICD-10-CM

## 2024-07-03 DIAGNOSIS — M5459 Other low back pain: Secondary | ICD-10-CM | POA: Diagnosis not present

## 2024-07-03 DIAGNOSIS — M25552 Pain in left hip: Secondary | ICD-10-CM

## 2024-07-03 NOTE — Therapy (Signed)
 OUTPATIENT PHYSICAL THERAPY TREATMENT Physical Therapy Progress Note  Dates of reporting period  05/20/24   to   07/03/24   Patient Name: Cathy Trevino MRN: [id number] DOB:07/21/1986, 38 y.o., female Today's Date: 07/03/2024  END OF SESSION:  PT End of Session - 07/03/24 1600     Visit Number 10    Number of Visits 17    Date for PT Re-Evaluation 08/12/24    Authorization Type [place] [employer] reporting period from 05/20/2024    Authorization Time Period [employer] auth#[id number] for 10 PT vst from 6/9-8/8    Authorization - Visit Number 9    Authorization - Number of Visits 10    Progress Note Due on Visit 10    PT Start Time 1600    PT Stop Time 1640    PT Time Calculation (min) 40 min    Activity Tolerance Patient tolerated treatment well    Behavior During Therapy [facility] for tasks assessed/performed          Past Medical History:  Diagnosis Date   Anemia    Anxiety    Asthma    Depression    Menorrhagia    Past Surgical History:  Procedure Laterality Date   CESAREAN SECTION  2012   TUBAL LIGATION     Patient Active Problem List   Diagnosis Date Noted   Irritant contact dermatitis due to plants, except food 06/27/2024   Gastroesophageal reflux disease without esophagitis 05/29/2024   Pain of upper abdomen 05/14/2024   Left hip pain 05/14/2024   MDD (major depressive disorder), recurrent episode, moderate (HCC) 10/02/2022   Current moderate episode of major depressive disorder without prior episode (HCC) 09/06/2022   Bereavement 09/06/2022   Anxiety disorder 09/06/2022   Osteoarthritis of facet joint of lumbar spine 04/13/2021   Spondylosis without myelopathy or radiculopathy, lumbosacral region 04/13/2021   Abnormal MRI, lumbar spine (07/15/2018) 03/09/2021   Chronic low back pain (1ry area of Pain) (Bilateral) (R>L) w/o sciatica 03/09/2021   Lumbosacral facet syndrome (Bilateral) 03/09/2021   Chronic sacroiliac joint pain (Bilateral) (R>L) 03/09/2021    DDD (degenerative disc disease), lumbosacral 03/09/2021   Morbid obesity with BMI of 40.0-44.9, adult (HCC) 03/09/2021   Levoscoliosis of lumbar spine 03/09/2021   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) 03/09/2021   Allergic rhinitis due to animal (cat) (dog) hair and dander 01/30/2021   Allergic rhinitis due to pollen 01/30/2021   Rash and other nonspecific skin eruption 01/30/2021   Chronic pain syndrome 01/30/2021   Pharmacologic therapy 01/30/2021   Disorder of skeletal system 01/30/2021   Problems influencing health status 01/30/2021   Varicose veins of leg with pain, bilateral 08/05/2019   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) 10/17/2018   Hyperlipidemia 10/17/2018   Other B-complex deficiencies 05/23/2018   Vitamin D  deficiency 05/23/2018   Menorrhagia with regular cycle 03/11/2018   Microcytic anemia 02/25/2018   Iron deficiency anemia due to chronic blood loss 02/18/2018   Chest pain 12/21/2015   Obesity 11/25/2014   Allergic rhinitis 05/21/2014   Dermatitis 10/23/2013   Other headache syndrome 06/11/2013   PCP: Carin Gauze, NP  REFERRING PROVIDER: Carin Gauze, NP  REFERRING DIAG: left hip pain  THERAPY DIAG:  Other low back pain  Pain in left hip  Other abnormalities of gait and mobility  Rationale for Evaluation and Treatment: Rehabilitation  ONSET DATE: approximately 05/06/2024  SUBJECTIVE:   PERTINENT HISTORY: Patient is a 38 y.o. female who presents to  outpatient physical therapy with a referral for medical diagnosis left hip pain. This patient's chief complaints consist of pain at the left iliac crest, leading to the following functional deficits: sitting, standing, squatting, bending, stairs, walking over 15 minutes, reading, sleeping, caring for household. 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); Hyperlipidemia; Obesity; Other B-complex deficiencies; Other headache syndrome; Rash and other nonspecific skin eruption; Vitamin D  deficiency; Chronic pain syndrome; Disorder of skeletal system; Abnormal MRI, lumbar spine (07/15/2018); Chronic low back pain (1ry area of Pain) (Bilateral) (R>L) w/o sciatica; Lumbosacral facet syndrome (Bilateral); Chronic sacroiliac joint pain (Bilateral) (R>L); DDD (degenerative disc disease), lumbosacral; Morbid obesity with BMI of 40.0-44.9, adult (HCC); Levoscoliosis of lumbar spine; 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 she  has a past medical history of Anemia, Anxiety, Asthma, Depression, and Menorrhagia. she  has a past surgical history that includes Cesarean section (2012); plantar faciitis surgery R, and Tubal ligation. Patient denies hx of cancer, stroke, seizures, lung problems, heart problems, diabetes, unexplained weight loss, unexplained changes in bowel or bladder problems, unexplained stumbling or dropping things, osteoporosis, and spinal surgery  Exercise history: She has an exercise ball and will sit on it for 10-15 minutes a day. Walks the neighborhood with dogs 3x a day for 10 minutes.   SUBJECTIVE STATEMENT: Pt felt good after last session.  Pt stable thereafter and today. No additional updates.   PAIN: 5/10 today  left posterolateral iliac crest  FUNCTIONAL LIMITATIONS: sitting, standing, squatting, bending, stairs, walking over 15 minutes, reading, sleeping, caring for household.  LEISURE: reading  PRECAUTIONS: None WEIGHT BEARING RESTRICTIONS: No FALLS:  Has patient fallen in last 6 months? 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 PLOF: Independent PATIENT GOALS: keep myself mobile  OBJECTIVE                                                                                                                             TREATMENT 07/03/24  ACT ONE: pt hooklying on plinth  -Hooklying bridge + blue ball squeeze x12  -Blue ball double curlup (hooklying) 1x15 (starting 1/3 the way up)  -Hooklying bridge + blue ball squeeze x12  -Blue ball double curlup (hooklying) 1x15 (starting 1/3 the way up)  -Hooklying bridge + blue ball squeeze x12  -Blue ball double curlup (hooklying) 1x15 (starting 1/3 the way up)   entr'acte  ACT  TWO: Patient sitting (mostly)  -Seated band clam 1x15 GTB  -Green band seated marching, alternate x30 -seated yellowTB reverse clam 1x10x3secH -yellow TB side stepping in tinyhallway 1x bilat Dan's door to [employer] -Seated band clam 1x15 GTB  -Green band seated marching, alternate x30 -seated yellowTB reverse clam 1x15xsecH -RED TB side stepping in tinyhallway 1x bilat Dan's door to [employer]  entr'acte  ACT THREE: Adventures new  -standing hip flexion on rotary MATRIX 40lb 1x12 bilat; height 3, lever 1  -standing hip ABDCT on rotary MATRIX 40lb 1x12 bilat; height 3, lever 1 -STS from bluechair c 15lb sternal ball x10  -standing hip flexion on rotary MATRIX 40lb 1x12 bilat; height 3, lever 1  -standing hip ABDCT on rotary MATRIX 40lb 1x12 bilat; height 3, lever 1 -STS from bluechair c 15lb sternal ball x10  PATIENT EDUCATION:  Education details: Exercise purpose/form. Self management techniques. Person educated: Patient Education method: Explanation,  Demonstration, and Handouts Education comprehension: verbalized understanding, returned demonstration, and needs further education  HOME EXERCISE PROGRAM: Access Code: [email or web address] URL: [email or web address] Date: 06/05/2024 Prepared by: Camie Cleverly  Exercises - Prone Press Up  - 2 x daily - 15 reps - 1 second hold - Neutral Curl Up with Straight Leg  - 1 x daily - 3 sets - 5 reps - 10 seconds hold - Bird Dog  - 1 x daily - 3 sets - 5 reps - 10 second hold - Side Plank on Knees  - 1 x daily - 3 sets - 5 reps - 10 seconds hold  ASSESSMENT:  CLINICAL IMPRESSION: 10th visit this date and despite contiued high pain levels, pt does show signs of improved self report and strength testing. Pt has not been able to make much progress with her sustained walking goal which has limited her ability to maintain wellness and fitness. MMT reassessed last visit showing improvements since evaluation and resistance program is updated to reflect areas of remaining deficit. Good response to myofascial release of gluteus medius last session, however not repeated today due to proximity of previous session.  Pt does appear to have 2ndcondary back pain symptoms in session that are limiting at times. Patient will benefit from skilled physical therapy intervention to reduce deficits and impairments identified in evaluation, in order to reduce pain, improve quality of life, and maximize activity tolerance for ADL, IADL, and leisure/fitness. Physical therapy will help pt achieve long and short term goals of care.   OBJECTIVE IMPAIRMENTS: decreased activity tolerance, decreased endurance, decreased knowledge of condition, decreased mobility, difficulty walking, decreased ROM, decreased strength, increased muscle spasms, impaired flexibility, improper body mechanics, postural dysfunction, obesity, and pain.   ACTIVITY LIMITATIONS: carrying, lifting, bending, sitting, standing, squatting, stairs, transfers, bed  mobility, locomotion level, and caring for others  PARTICIPATION LIMITATIONS: cleaning, laundry, interpersonal relationship, shopping, community activity, and  sitting, standing, squatting, bending, stairs, walking over 15 minutes, reading, sleeping, caring for household  PERSONAL FACTORS: Past/current experiences and 3+ comorbidities:   are also affecting patient's functional outcome.   REHAB POTENTIAL: Good  CLINICAL DECISION MAKING: Evolving/moderate complexity  EVALUATION COMPLEXITY: Moderate   GOALS: Goals reviewed with patient? No  SHORT TERM GOALS: Target date: 06/03/2024 Patient will be independent with initial home exercise program for self-management of symptoms. Baseline: Initial HEP provided at IE (05/20/24); 07/01/24: has been consistent and compliant  Goal status: ACHIEVED  LONG TERM GOALS: Target date: 08/12/2024 Patient will be independent with a long-term home exercise program for self-management of symptoms.  Baseline: Initial HEP provided at IE (05/20/24); Goal status: In Progress  2.  Patient will demonstrate improved Lower Extremity Functional Scale (LEFS) to equal or greater than 64/80 to demonstrate improvement in overall condition and self-reported functional ability.  Baseline: 32/80 (05/20/24); 37/80 (07/01/24)  Goal status: In Progress  3.  Patient will tolerate 30 minutes of walking without increased pain.  Baseline: increased pain or has to stop after 15 min of walking (05/20/24); unchanged since previous (07/01/24).  Goal status: In Progress  4.  Patient will demonstrate improvement in Patient Specific Functional Scale (PSFS) of equal or greater than 8/10 points to reflect clinically significant improvement in patient's most valued functional activities. Baseline: to be measured at visit 2 as appropriate (05/20/24); 5.7/10 (05/27/2024);  Goal status: In Progress  5.  Patient will report NPRS equal or less than 3/10 during functional activities during the  last 2 weeks to improve their abilitly to complete community, work and/or recreational activities with less limitation. Baseline: 8/10 (05/20/24); 5-7/10 (07/01/24) over preceding 2 week.  Goal status: In Progress  PLAN:  PT FREQUENCY: 1-2x/week PT DURATION: 8-12 weeks PLANNED INTERVENTIONS: 97164- PT Re-evaluation, 97750- Physical Performance Testing, 97110-Therapeutic exercises, 97530- Therapeutic activity, [id number]- Neuromuscular re-education, 97535- Self Care, [id number]- Manual therapy, [phone]- Gait training, [phone]- Aquatic Therapy, [phone]- Electrical stimulation (unattended), 20560 (1-2 muscles), 20561 (3+ muscles)- Dry Needling, Patient/Family education, Balance training, Joint mobilization, Spinal mobilization, Cryotherapy, and Moist heat  PLAN FOR NEXT SESSION: update HEP as appropriate, QL stretch, manual therapy, progressive core/LE/Balance exercises, hip strengthening. Education. Walking program.   4:03 PM, 07/03/24 Peggye JAYSON Linear, PT, DPT Physical Therapist - [place] [phone] (Office)

## 2024-07-07 ENCOUNTER — Encounter: Payer: Self-pay | Admitting: Physical Therapy

## 2024-07-07 ENCOUNTER — Ambulatory Visit

## 2024-07-07 DIAGNOSIS — M5459 Other low back pain: Secondary | ICD-10-CM

## 2024-07-07 DIAGNOSIS — M25552 Pain in left hip: Secondary | ICD-10-CM

## 2024-07-07 DIAGNOSIS — R2689 Other abnormalities of gait and mobility: Secondary | ICD-10-CM

## 2024-07-07 NOTE — Therapy (Signed)
 OUTPATIENT PHYSICAL THERAPY TREATMENT   Patient Name: Cathy Trevino MRN: [id number] DOB:28-Apr-1986, 38 y.o., female Today's Date: 07/07/2024  END OF SESSION:  PT End of Session - 07/07/24 1507     Visit Number 11    Number of Visits 17    Date for PT Re-Evaluation 08/12/24    Authorization Type [place] [employer] reporting period from 05/20/2024    Authorization Time Period [employer] auth#[id number] for 10 PT vst from 6/9-8/8    Authorization - Number of Visits 10    Progress Note Due on Visit 10    PT Start Time 1516    PT Stop Time 1600    PT Time Calculation (min) 44 min    Activity Tolerance Patient tolerated treatment well    Behavior During Therapy [facility] for tasks assessed/performed          Past Medical History:  Diagnosis Date   Anemia    Anxiety    Asthma    Depression    Menorrhagia    Past Surgical History:  Procedure Laterality Date   CESAREAN SECTION  2012   TUBAL LIGATION     Patient Active Problem List   Diagnosis Date Noted   Irritant contact dermatitis due to plants, except food 06/27/2024   Gastroesophageal reflux disease without esophagitis 05/29/2024   Pain of upper abdomen 05/14/2024   Left hip pain 05/14/2024   MDD (major depressive disorder), recurrent episode, moderate (HCC) 10/02/2022   Current moderate episode of major depressive disorder without prior episode (HCC) 09/06/2022   Bereavement 09/06/2022   Anxiety disorder 09/06/2022   Osteoarthritis of facet joint of lumbar spine 04/13/2021   Spondylosis without myelopathy or radiculopathy, lumbosacral region 04/13/2021   Abnormal MRI, lumbar spine (07/15/2018) 03/09/2021   Chronic low back pain (1ry area of Pain) (Bilateral) (R>L) w/o sciatica 03/09/2021   Lumbosacral facet syndrome (Bilateral) 03/09/2021   Chronic sacroiliac joint pain (Bilateral) (R>L) 03/09/2021   DDD (degenerative disc disease), lumbosacral 03/09/2021   Morbid obesity with BMI of 40.0-44.9, adult (HCC) 03/09/2021    Levoscoliosis of lumbar spine 03/09/2021   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) 03/09/2021   Allergic rhinitis due to animal (cat) (dog) hair and dander 01/30/2021   Allergic rhinitis due to pollen 01/30/2021   Rash and other nonspecific skin eruption 01/30/2021   Chronic pain syndrome 01/30/2021   Pharmacologic therapy 01/30/2021   Disorder of skeletal system 01/30/2021   Problems influencing health status 01/30/2021   Varicose veins of leg with pain, bilateral 08/05/2019   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) 10/17/2018   Hyperlipidemia 10/17/2018   Other B-complex deficiencies 05/23/2018   Vitamin D  deficiency 05/23/2018   Menorrhagia with regular cycle 03/11/2018   Microcytic anemia 02/25/2018   Iron deficiency anemia due to chronic blood loss 02/18/2018   Chest pain 12/21/2015   Obesity 11/25/2014   Allergic rhinitis 05/21/2014   Dermatitis 10/23/2013   Other headache syndrome 06/11/2013   PCP: Carin Gauze, NP  REFERRING PROVIDER: Carin Gauze, NP  REFERRING DIAG: left hip pain  THERAPY DIAG:  Other low back pain  Pain in left hip  Other abnormalities of gait and mobility  Rationale for Evaluation and Treatment: Rehabilitation  ONSET DATE: approximately 05/06/2024  SUBJECTIVE:   PERTINENT HISTORY: Patient is a 38 y.o. female who presents to outpatient physical therapy with a referral for medical diagnosis left hip pain. This patient's chief complaints consist of pain at the left iliac crest, leading  to the following functional deficits: sitting, standing, squatting, bending, stairs, walking over 15 minutes, reading, sleeping, caring for household. 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); Hyperlipidemia; Obesity; Other B-complex deficiencies; Other headache syndrome; Rash and other nonspecific skin eruption; Vitamin D  deficiency; Chronic pain syndrome; Disorder of skeletal system; Abnormal MRI, lumbar spine (07/15/2018); Chronic low back pain (1ry area of Pain) (Bilateral) (R>L) w/o sciatica; Lumbosacral facet syndrome (Bilateral); Chronic sacroiliac joint pain (Bilateral) (R>L); DDD (degenerative disc disease), lumbosacral; Morbid obesity with BMI of 40.0-44.9, adult (HCC); Levoscoliosis of lumbar spine; 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 she  has a past medical history of Anemia, Anxiety, Asthma, Depression, and Menorrhagia. she  has a past surgical history that includes Cesarean section (2012); plantar faciitis surgery R, and Tubal ligation. Patient denies hx of cancer, stroke, seizures, lung problems, heart problems, diabetes, unexplained weight loss, unexplained changes in bowel or bladder problems, unexplained stumbling or dropping things, osteoporosis, and spinal surgery  Exercise history: She has an exercise ball and will sit on it for 10-15 minutes a day. Walks the neighborhood with dogs 3x a day for 10 minutes.   SUBJECTIVE STATEMENT: Pt is happy with her progress. Pain has remained around a 5/10 NPS which is good for her.   PAIN: 5/10 today left posterolateral iliac crest  FUNCTIONAL LIMITATIONS: sitting, standing, squatting, bending, stairs, walking  over 15 minutes, reading, sleeping, caring for household.  LEISURE: reading  PRECAUTIONS: None WEIGHT BEARING RESTRICTIONS: No FALLS:  Has patient fallen in last 6 months? 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 PLOF: Independent PATIENT GOALS: keep myself mobile  OBJECTIVE                                                                                                                             TREATMENT 07/07/24  pt hooklying on plinth  -Hooklying bridge + blue ball squeeze x12  -Blue ball double curlup (hooklying) 1x15 (foot of adjustable table elevated to pt preference)  -Hooklying bridge + blue ball squeeze x12  -Blue ball double curlup (hooklying) 1x15 (starting 1/3 the way up)  -Hooklying bridge + blue ball squeeze x12  -Blue ball double curlup (hooklying) 1x15 (starting 1/3 the way up)    Patient sitting (mostly)  Seated reverse clams with RTB: 2x15 Seated clams with RTB: 2x15  RTB side steps 4x30'  Standing hip  flexion on rotary MATRIX 40lb 1x15 bilat; height 3, lever 1  Standing hip ABDCT on rotary MATRIX 40lb 1x15 bilat; height 3, lever 1  STS: 2x10, 15# med ball   Updated HEP. Reviewed reps/sets/frequency  PATIENT EDUCATION:  Education details: Exercise purpose/form. Self management techniques. Person educated: Patient Education method: Explanation, Demonstration, and Handouts Education comprehension: verbalized understanding, returned demonstration, and needs further education  HOME EXERCISE PROGRAM: Access Code: [email or web address] URL: [email or web address] Date: 07/07/2024 Prepared by: Dorina Kingfisher  Exercises - Neutral Curl Up with Straight Leg  - 1 x daily - 3 sets - 5 reps - 10 seconds hold - Bird Dog  - 1 x daily - 3 sets - 5 reps - 10 second hold - Side Plank on Knees  - 1 x daily - 3 sets - 5 reps - 10 seconds hold - Squat with Chair Touch  - 1 x daily - 3 sets - 10 reps - Hooklying Clamshell with Resistance  - 1 x daily - 7 x weekly  - 3 sets - 10 reps  Access Code: [email or web address] URL: [email or web address] Date: 06/05/2024 Prepared by: Camie Cleverly  Exercises - Prone Press Up  - 2 x daily - 15 reps - 1 second hold - Neutral Curl Up with Straight Leg  - 1 x daily - 3 sets - 5 reps - 10 seconds hold - Bird Dog  - 1 x daily - 3 sets - 5 reps - 10 second hold - Side Plank on Knees  - 1 x daily - 3 sets - 5 reps - 10 seconds hold  ASSESSMENT:  CLINICAL IMPRESSION: Continuing POC with progressing hip strength. Pt continues to have good response with hip loading activities without worsening of symptoms. Pt remains highly motivated to progress and improve upon her condition. Updated HEP to reflect continuous loading for home completion. Patient will benefit from skilled physical therapy intervention to reduce deficits and impairments identified in evaluation, in order to reduce pain, improve quality of life, and maximize activity tolerance for ADL, IADL, and leisure/fitness. Physical therapy will help pt achieve long and short term goals of care.    OBJECTIVE IMPAIRMENTS: decreased activity tolerance, decreased endurance, decreased knowledge of condition, decreased mobility, difficulty walking, decreased ROM, decreased strength, increased muscle spasms, impaired flexibility, improper body mechanics, postural dysfunction, obesity, and pain.   ACTIVITY LIMITATIONS: carrying, lifting, bending, sitting, standing, squatting, stairs, transfers, bed mobility, locomotion level, and caring for others  PARTICIPATION LIMITATIONS: cleaning, laundry, interpersonal relationship, shopping, community activity, and  sitting, standing, squatting, bending, stairs, walking over 15 minutes, reading, sleeping, caring for household  PERSONAL FACTORS: Past/current experiences and 3+ comorbidities:   are also affecting patient's functional outcome.   REHAB POTENTIAL: Good  CLINICAL DECISION MAKING: Evolving/moderate complexity  EVALUATION  COMPLEXITY: Moderate   GOALS: Goals reviewed with patient? No  SHORT TERM GOALS: Target date: 06/03/2024 Patient will be independent with initial home exercise program for self-management of symptoms. Baseline: Initial HEP provided at IE (05/20/24); 07/01/24: has been consistent and compliant  Goal status: ACHIEVED  LONG TERM GOALS: Target date: 08/12/2024 Patient will be independent with a long-term home exercise program for self-management of symptoms.  Baseline: Initial HEP provided at IE (05/20/24); Goal status: In Progress  2.  Patient will demonstrate improved Lower Extremity Functional Scale (LEFS) to equal or greater than 64/80 to demonstrate improvement in overall condition and self-reported functional ability.  Baseline: 32/80 (05/20/24); 37/80 (07/01/24)  Goal status: In Progress  3.  Patient will tolerate 30  minutes of walking without increased pain.  Baseline: increased pain or has to stop after 15 min of walking (05/20/24); unchanged since previous (07/01/24).  Goal status: In Progress  4.  Patient will demonstrate improvement in Patient Specific Functional Scale (PSFS) of equal or greater than 8/10 points to reflect clinically significant improvement in patient's most valued functional activities. Baseline: to be measured at visit 2 as appropriate (05/20/24); 5.7/10 (05/27/2024);  Goal status: In Progress  5.  Patient will report NPRS equal or less than 3/10 during functional activities during the last 2 weeks to improve their abilitly to complete community, work and/or recreational activities with less limitation. Baseline: 8/10 (05/20/24); 5-7/10 (07/01/24) over preceding 2 week.  Goal status: In Progress  PLAN:  PT FREQUENCY: 1-2x/week PT DURATION: 8-12 weeks PLANNED INTERVENTIONS: 97164- PT Re-evaluation, 97750- Physical Performance Testing, 97110-Therapeutic exercises, 97530- Therapeutic activity, [id number]- Neuromuscular re-education, 97535- Self Care, [id number]- Manual  therapy, [phone]- Gait training, [phone]- Aquatic Therapy, [phone]- Electrical stimulation (unattended), 20560 (1-2 muscles), 20561 (3+ muscles)- Dry Needling, Patient/Family education, Balance training, Joint mobilization, Spinal mobilization, Cryotherapy, and Moist heat  PLAN FOR NEXT SESSION: update HEP as appropriate, QL stretch, manual therapy, progressive core/LE/Balance exercises, hip strengthening. Education. Walking program.    Dorina HERO. Fairly IV, PT, DPT Physical Therapist- [facility] 4:03 PM, 07/07/24

## 2024-07-08 ENCOUNTER — Ambulatory Visit: Admitting: Physical Therapy

## 2024-07-10 ENCOUNTER — Encounter: Admitting: Physical Therapy

## 2024-07-15 ENCOUNTER — Telehealth: Payer: Self-pay | Admitting: Physical Therapy

## 2024-07-15 ENCOUNTER — Ambulatory Visit: Admitting: Physical Therapy

## 2024-07-15 NOTE — Telephone Encounter (Signed)
 LVM notifying patient of missed PT visit scheduled at 4pm today. Requested they call back to reschedule, confirm next appointment, or let us  know of any changes in PT plans. Let patient know that with any no-show I am required to review our cancellation policy that after 2 no-shows we remove future visits from the schedule, they are responsible to calling in to reschedule, and they will only be able to schedule one appointment at a time and/or we may remove a patient from the schedule.   Note: may have been mix up with communication about insurance authorization and support staff unsure if patient knew to come to this PT visit without being called or calling first.   Camie SAUNDERS. Juli, PT, DPT 07/15/24, 4:37 PM  [facility] [address] [place], [place] [address] P: [phone] I F: [phone]

## 2024-07-17 ENCOUNTER — Encounter: Payer: Self-pay | Admitting: Physical Therapy

## 2024-07-17 ENCOUNTER — Ambulatory Visit: Admitting: Physical Therapy

## 2024-07-17 DIAGNOSIS — R2689 Other abnormalities of gait and mobility: Secondary | ICD-10-CM

## 2024-07-17 DIAGNOSIS — M5459 Other low back pain: Secondary | ICD-10-CM | POA: Diagnosis not present

## 2024-07-17 DIAGNOSIS — M25552 Pain in left hip: Secondary | ICD-10-CM

## 2024-07-17 NOTE — Therapy (Signed)
 OUTPATIENT PHYSICAL THERAPY TREATMENT   Patient Name: Cathy Trevino DOBLE MRN: [id number] DOB:10-15-1986, 38 y.o., female Today's Date: 07/17/2024  END OF SESSION:  PT End of Session - 07/17/24 1909     Visit Number 12    Number of Visits 17    Date for PT Re-Evaluation 08/12/24    Authorization Type [place] [employer] reporting period from 05/20/2024    Authorization Time Period [employer] auth#[id number] for 2 addtional PT vsts until 10/7 (for a total of 12 tx from 6/9)    Authorization - Visit Number 11    Authorization - Number of Visits 12    Progress Note Due on Visit 20    PT Start Time 1606    PT Stop Time 1649    PT Time Calculation (min) 43 min    Activity Tolerance Patient tolerated treatment well    Behavior During Therapy WFL for tasks assessed/performed           Past Medical History:  Diagnosis Date   Anemia    Anxiety    Asthma    Depression    Menorrhagia    Past Surgical History:  Procedure Laterality Date   CESAREAN SECTION  2012   TUBAL LIGATION     Patient Active Problem List   Diagnosis Date Noted   Irritant contact dermatitis due to plants, except food 06/27/2024   Gastroesophageal reflux disease without esophagitis 05/29/2024   Pain of upper abdomen 05/14/2024   Left hip pain 05/14/2024   MDD (major depressive disorder), recurrent episode, moderate (HCC) 10/02/2022   Current moderate episode of major depressive disorder without prior episode (HCC) 09/06/2022   Bereavement 09/06/2022   Anxiety disorder 09/06/2022   Osteoarthritis of facet joint of lumbar spine 04/13/2021   Spondylosis without myelopathy or radiculopathy, lumbosacral region 04/13/2021   Abnormal MRI, lumbar spine (07/15/2018) 03/09/2021   Chronic low back pain (1ry area of Pain) (Bilateral) (R>L) w/o sciatica 03/09/2021   Lumbosacral facet syndrome (Bilateral) 03/09/2021   Chronic sacroiliac joint pain (Bilateral) (R>L) 03/09/2021   DDD (degenerative disc disease),  lumbosacral 03/09/2021   Morbid obesity with BMI of 40.0-44.9, adult (HCC) 03/09/2021   Levoscoliosis of lumbar spine 03/09/2021   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) 03/09/2021   Allergic rhinitis due to animal (cat) (dog) hair and dander 01/30/2021   Allergic rhinitis due to pollen 01/30/2021   Rash and other nonspecific skin eruption 01/30/2021   Chronic pain syndrome 01/30/2021   Pharmacologic therapy 01/30/2021   Disorder of skeletal system 01/30/2021   Problems influencing health status 01/30/2021   Varicose veins of leg with pain, bilateral 08/05/2019   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) 10/17/2018   Hyperlipidemia 10/17/2018   Other B-complex deficiencies 05/23/2018   Vitamin D  deficiency 05/23/2018   Menorrhagia with regular cycle 03/11/2018   Microcytic anemia 02/25/2018   Iron deficiency anemia due to chronic blood loss 02/18/2018   Chest pain 12/21/2015   Obesity 11/25/2014   Allergic rhinitis 05/21/2014   Dermatitis 10/23/2013   Other headache syndrome 06/11/2013   PCP: Carin Gauze, NP  REFERRING PROVIDER: Carin Gauze, NP  REFERRING DIAG: left hip pain  THERAPY DIAG:  Other low back pain  Pain in left hip  Other abnormalities of gait and mobility  Rationale for Evaluation and Treatment: Rehabilitation  ONSET DATE: approximately 05/06/2024  SUBJECTIVE:   PERTINENT HISTORY: Patient is a 38 y.o. female who presents to outpatient physical therapy with a referral for  medical diagnosis left hip pain. This patient's chief complaints consist of pain at the left iliac crest, leading to the following functional deficits: sitting, standing, squatting, bending, stairs, walking over 15 minutes, reading, sleeping, caring for household. Relevant past medical history and comorbidities include the following: she has Iron deficiency anemia due to chronic blood loss; Microcytic anemia; Menorrhagia with regular  cycle; Varicose veins of leg with pain, bilateral; Allergic rhinitis due to animal (cat) (dog) hair and dander; Allergic rhinitis due to pollen; Chest pain; Dermatitis; 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); Hyperlipidemia; Obesity; Other B-complex deficiencies; Other headache syndrome; Rash and other nonspecific skin eruption; Vitamin D  deficiency; Chronic pain syndrome; Disorder of skeletal system; Abnormal MRI, lumbar spine (07/15/2018); Chronic low back pain (1ry area of Pain) (Bilateral) (R>L) w/o sciatica; Lumbosacral facet syndrome (Bilateral); Chronic sacroiliac joint pain (Bilateral) (R>L); DDD (degenerative disc disease), lumbosacral; Morbid obesity with BMI of 40.0-44.9, adult (HCC); Levoscoliosis of lumbar spine; 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 she  has a past medical history of Anemia, Anxiety, Asthma, Depression, and Menorrhagia. she  has a past surgical history that includes Cesarean section (2012); plantar faciitis surgery R, and Tubal ligation. Patient denies hx of cancer, stroke, seizures, lung problems, heart problems, diabetes, unexplained weight loss, unexplained changes in bowel or bladder problems, unexplained stumbling or dropping things, osteoporosis, and spinal surgery  Exercise history: She has an exercise ball and will sit on it for 10-15 minutes a day. Walks the neighborhood with dogs 3x a day for 10 minutes.   SUBJECTIVE STATEMENT: Patient states her pain is about a 5/10 lately.    PAIN: 5/10 today left posterolateral iliac crest  FUNCTIONAL LIMITATIONS: sitting,  standing, squatting, bending, stairs, walking over 15 minutes, reading, sleeping, caring for household.  LEISURE: reading  PRECAUTIONS: None WEIGHT BEARING RESTRICTIONS: No FALLS:  Has patient fallen in last 6 months? 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 pt hooklying on plinth  -Hooklying bridge + blue ball squeeze x15  -Blue ball squeeze double reverse curl (hooklying) 1x15 (foot of adjustable table elevated to pt preference)  -Hooklying bridge + blue ball squeeze x15  -Blue ball squeeze double reverse curl 1x15 (starting 1/3 the way up)  -  Hooklying bridge + blue ball squeeze x15  -Blue ball squeeze double reverse curl  1x15 (starting 1/3 the way up)   Patient sitting Seated reverse clams with RTB: 2x15 Seated clams with RTB: 2x15   Therapeutic activities: dynamic therapeutic activities incorporating MULTIPLE parameters or areas of the body designed to achieve improved functional performance.   side steps with green theratube under feet and crossed in front of body  2x30' each way  Standing hip flexion on rotary MATRIX 40lb 1x15 bilat; height 3, lever 1, position 10. To improve single let stance and walking.   Standing hip ABDCT on rotary MATRIX 40lb 1x15 bilat; height 3, lever 1, position 7. Slight turn away from gesture LE to minimize TFL recruitment. To improve single leg stance and walking.   Squat tapping chair:  1x20, 15# med ball    PATIENT EDUCATION:  Education details: Exercise purpose/form. Self management  techniques. Person educated: Patient Education method: Explanation, Demonstration, and Handouts Education comprehension: verbalized understanding, returned demonstration, and needs further education  HOME EXERCISE PROGRAM: Access Code: [email or web address] URL: [email or web address] Date: 07/07/2024 Prepared by: Dorina Kingfisher  Exercises - Neutral Curl Up with Straight Leg  - 1 x daily - 3 sets - 5 reps - 10 seconds hold - Bird Dog  - 1 x daily - 3 sets - 5 reps - 10 second hold - Side Plank on Knees  - 1 x daily - 3 sets - 5 reps - 10 seconds hold - Squat with Chair Touch  - 1 x daily - 3 sets - 10 reps - Hooklying Clamshell with Resistance  - 1 x daily - 7 x weekly - 3 sets - 10 reps  Access Code: [email or web address] URL: [email or web address] Date: 06/05/2024 Prepared by: Camie Cleverly  Exercises - Prone Press Up  - 2 x daily - 15 reps - 1 second hold - Neutral Curl Up with Straight Leg  - 1 x daily - 3 sets - 5 reps - 10 seconds hold - Bird Dog  - 1 x daily - 3 sets - 5 reps - 10 second hold - Side Plank on Knees  - 1 x daily - 3 sets - 5 reps - 10 seconds hold  ASSESSMENT:  CLINICAL IMPRESSION: Continued with similar exercises to last PT session with mild progressions due to good prior response. Most interventions seem less than challenging to patient but are well tolerated. Discussed needs for discharge planned at next visit due to insurance limit, and patient is feeling confident with her HEP. Increased challenge on lateral walking some and educated that she could return to using BlackTB at home if it did not cause pain. Patient would benefit from continued management of limiting condition by skilled physical therapist to address remaining impairments and functional limitations to work towards stated goals and return to PLOF or maximal functional independence.     OBJECTIVE IMPAIRMENTS: decreased activity tolerance, decreased endurance, decreased knowledge of condition,  decreased mobility, difficulty walking, decreased ROM, decreased strength, increased muscle spasms, impaired flexibility, improper body mechanics, postural dysfunction, obesity, and pain.   ACTIVITY LIMITATIONS: carrying, lifting, bending, sitting, standing, squatting, stairs, transfers, bed mobility, locomotion level, and caring for others  PARTICIPATION LIMITATIONS: cleaning, laundry, interpersonal relationship, shopping, community activity, and  sitting, standing, squatting, bending, stairs, walking over 15 minutes, reading, sleeping, caring for household  PERSONAL FACTORS: Past/current experiences and 3+ comorbidities:   are also affecting patient's functional outcome.   REHAB POTENTIAL: Good  CLINICAL DECISION MAKING: Evolving/moderate complexity  EVALUATION  COMPLEXITY: Moderate   GOALS: Goals reviewed with patient? No  SHORT TERM GOALS: Target date: 06/03/2024 Patient will be independent with initial home exercise program for self-management of symptoms. Baseline: Initial HEP provided at IE (05/20/24); 07/01/24: has been consistent and compliant  Goal status: ACHIEVED  LONG TERM GOALS: Target date: 08/12/2024 Patient will be independent with a long-term home exercise program for self-management of symptoms.  Baseline: Initial HEP provided at IE (05/20/24); Goal status: In Progress  2.  Patient will demonstrate improved Lower Extremity Functional Scale (LEFS) to equal or greater than 64/80 to demonstrate improvement in overall condition and self-reported functional ability.  Baseline: 32/80 (05/20/24); 37/80 (07/01/24)  Goal status: In Progress  3.  Patient will tolerate 30 minutes of walking without increased pain.  Baseline: increased pain or has to stop after 15 min of walking (05/20/24); unchanged since previous (07/01/24).  Goal status: In Progress  4.  Patient will demonstrate improvement in Patient Specific Functional Scale (PSFS) of equal or greater than 8/10 points to  reflect clinically significant improvement in patient's most valued functional activities. Baseline: to be measured at visit 2 as appropriate (05/20/24); 5.7/10 (05/27/2024);  Goal status: In Progress  5.  Patient will report NPRS equal or less than 3/10 during functional activities during the last 2 weeks to improve their abilitly to complete community, work and/or recreational activities with less limitation. Baseline: 8/10 (05/20/24); 5-7/10 (07/01/24) over preceding 2 week.  Goal status: In Progress  PLAN:  PT FREQUENCY: 1-2x/week PT DURATION: 8-12 weeks PLANNED INTERVENTIONS: 97164- PT Re-evaluation, 97750- Physical Performance Testing, 97110-Therapeutic exercises, 97530- Therapeutic activity, [id number]- Neuromuscular re-education, 97535- Self Care, 02859- Manual therapy, [phone]- Gait training, [phone]- Aquatic Therapy, [phone]- Electrical stimulation (unattended), 20560 (1-2 muscles), 20561 (3+ muscles)- Dry Needling, Patient/Family education, Balance training, Joint mobilization, Spinal mobilization, Cryotherapy, and Moist heat  PLAN FOR NEXT SESSION: discharge.    Camie SAUNDERS. Juli, PT, DPT, Cert. MDT 07/17/24, 7:15 PM  [facility] [address] [place], [place] [address] P: [phone] I F: [phone]

## 2024-07-28 ENCOUNTER — Other Ambulatory Visit: Payer: Self-pay | Admitting: Cardiology

## 2024-07-28 DIAGNOSIS — Z1231 Encounter for screening mammogram for malignant neoplasm of breast: Secondary | ICD-10-CM

## 2024-08-06 ENCOUNTER — Ambulatory Visit: Admitting: Physical Therapy

## 2024-08-27 ENCOUNTER — Other Ambulatory Visit

## 2024-08-28 ENCOUNTER — Ambulatory Visit: Payer: Self-pay | Admitting: Cardiology

## 2024-08-28 LAB — LIPID PANEL
Chol/HDL Ratio: 4.3 ratio (ref 0.0–4.4)
Cholesterol, Total: 170 mg/dL (ref 100–199)
HDL: 40 mg/dL (ref >39)
LDL Chol Calc (NIH): 106 mg/dL — ABNORMAL HIGH (ref 0–99)
Triglycerides: 135 mg/dL (ref 0–149)
VLDL Cholesterol Cal: 24 mg/dL (ref 5–40)

## 2024-08-28 LAB — TSH: TSH: 1.75 u[IU]/mL (ref 0.450–4.500)

## 2024-08-28 LAB — CMP14+EGFR
ALT: 16 IU/L (ref 0–32)
AST: 15 IU/L (ref 0–40)
Albumin: 3.5 g/dL — ABNORMAL LOW (ref 3.9–4.9)
Alkaline Phosphatase: 50 IU/L (ref 44–121)
BUN/Creatinine Ratio: 16 (ref 9–23)
BUN: 10 mg/dL (ref 6–20)
Bilirubin Total: <0.2 mg/dL (ref 0.0–1.2)
CO2: 25 mmol/L (ref 20–29)
Calcium: 8.7 mg/dL (ref 8.7–10.2)
Chloride: 107 mmol/L — ABNORMAL HIGH (ref 96–106)
Creatinine, Ser: 0.62 mg/dL (ref 0.57–1.00)
Globulin, Total: 2.3 g/dL (ref 1.5–4.5)
Glucose: 83 mg/dL (ref 70–99)
Potassium: 4.7 mmol/L (ref 3.5–5.2)
Sodium: 142 mmol/L (ref 134–144)
Total Protein: 5.8 g/dL — ABNORMAL LOW (ref 6.0–8.5)
eGFR: 116 mL/min/1.73 (ref >59)

## 2024-08-28 LAB — HEMOGLOBIN A1C
Est. average glucose Bld gHb Est-mCnc: 111 mg/dL
Hgb A1c MFr Bld: 5.5 % (ref 4.8–5.6)

## 2024-08-29 ENCOUNTER — Encounter: Payer: Self-pay | Admitting: Cardiology

## 2024-08-29 ENCOUNTER — Ambulatory Visit: Admitting: Cardiology

## 2024-08-29 VITALS — BP 102/60 | HR 93 | Ht 64.0 in | Wt 256.0 lb

## 2024-08-29 DIAGNOSIS — E782 Mixed hyperlipidemia: Secondary | ICD-10-CM

## 2024-08-29 MED ORDER — MELOXICAM 15 MG PO TABS: 15.0000 mg | ORAL_TABLET | Freq: Every day | ORAL | 1 refills | Status: AC

## 2024-08-29 MED ORDER — ROSUVASTATIN CALCIUM 20 MG PO TABS: 20.0000 mg | ORAL_TABLET | Freq: Every day | ORAL | 1 refills | Status: AC

## 2024-08-29 MED ORDER — CITALOPRAM HYDROBROMIDE 10 MG PO TABS: 10.0000 mg | ORAL_TABLET | Freq: Every day | ORAL | 1 refills | Status: AC

## 2024-08-29 MED ORDER — PANTOPRAZOLE SODIUM 20 MG PO TBEC: 20.0000 mg | DELAYED_RELEASE_TABLET | Freq: Every day | ORAL | 1 refills | Status: AC

## 2024-08-29 MED ORDER — ERGOCALCIFEROL 1.25 MG (50000 UT) PO CAPS: 50000.0000 [IU] | ORAL_CAPSULE | ORAL | 2 refills | Status: AC

## 2024-08-29 NOTE — Progress Notes (Signed)
 Established Patient Office Visit  Subjective:  Patient ID: Cathy Trevino, female    DOB: 06-Mar-1986  Age: 38 y.o. MRN: [id number]  Chief Complaint  Patient presents with   Follow-up    Lab Results & Discuss feeling like she's will pass out x  2 weeks     Patient in office for 2 month follow up, discuss recent lab work. Patient reports feeling pre syncopal in the afternoon, for the past two weeks. Patient reports episodes happen when she is standing in line at the store. Reports drinking 64+ ounces of water daily. States she eats well, recent blood work normal.  Recommend when it happens, check blood pressure. Blood pressure today in office low, likely blood pressure dropping, causing dizziness.  Continue same medications.     No other concerns at this time.   Past Medical History:  Diagnosis Date   Anemia    Anxiety    Asthma    Depression    Menorrhagia     Past Surgical History:  Procedure Laterality Date   CESAREAN SECTION  2012   TUBAL LIGATION      Social History   Socioeconomic History   Marital status: Divorced    Spouse name: Not on file   Number of children: 3   Years of education: Not on file   Highest education level: Some college, no degree  Occupational History   Not on file  Tobacco Use   Smoking status: Former    Current packs/day: 0.00    Average packs/day: 0.3 packs/day for 5.0 years (1.3 ttl pk-yrs)    Types: Cigarettes    Start date: 02/22/2011    Quit date: 02/22/2016    Years since quitting: 8.5   Smokeless tobacco: Never  Vaping Use   Vaping status: Never Used  Substance and Sexual Activity   Alcohol use: No   Drug use: No   Sexual activity: Yes    Birth control/protection: I.U.D.  Other Topics Concern   Not on file  Social History Narrative   Not on file   Social Drivers of Health   Financial Resource Strain: Not on file  Food Insecurity: Not on file  Transportation Needs: Not on file  Physical Activity: Sufficiently Active  (11/04/2018)   Exercise Vital Sign    Days of Exercise per Week: 7 days    Minutes of Exercise per Session: 40 min  Stress: Not on file  Social Connections: Not on file  Intimate Partner Violence: Not on file    Family History  Problem Relation Age of Onset   Heart failure Mother    Hypertension Mother    COPD Mother    Alcohol abuse Father    Depression Father    COPD Father    Alcohol abuse Brother    Schizophrenia Brother    ADD / ADHD Daughter    Autism spectrum disorder Son    Autism spectrum disorder Son     Allergies  Allergen Reactions   Other     Seasonal allergies   Penicillins Rash    Outpatient Medications Prior to Visit  Medication Sig   acetaminophen  (TYLENOL ) 500 MG tablet Take 500 mg by mouth every 6 (six) hours as needed.   ascorbic acid (VITAMIN C) 500 MG tablet Take 500 mg by mouth every other day.   cetirizine  (ZYRTEC ) 10 MG tablet Take 1 tablet (10 mg total) by mouth at bedtime.   fluticasone  (FLONASE ) 50 MCG/ACT nasal spray Place 1 spray  into both nostrils daily.   hydrOXYzine  (VISTARIL ) 25 MG capsule Take 1 capsule (25 mg total) by mouth 2 (two) times daily as needed for anxiety.   Multiple Vitamin (MULTIVITAMIN ADULT PO) Take 1 tablet by mouth daily at 8 pm.   nystatin  (MYCOSTATIN /NYSTOP ) powder Apply 1 Application topically 3 (three) times daily.   triamcinolone  cream (KENALOG ) 0.1 % Apply 1 Application topically 2 (two) times daily.   [DISCONTINUED] citalopram  (CELEXA ) 10 MG tablet Take 1 tablet (10 mg total) by mouth daily.   [DISCONTINUED] ergocalciferol  (VITAMIN D2) 1.25 MG (50000 UT) capsule Take 1 capsule (50,000 Units total) by mouth once a week.   [DISCONTINUED] meloxicam  (MOBIC ) 15 MG tablet Take 1 tablet (15 mg total) by mouth daily.   [DISCONTINUED] pantoprazole  (PROTONIX ) 20 MG tablet Take 1 tablet (20 mg total) by mouth daily.   [DISCONTINUED] rosuvastatin  (CRESTOR ) 20 MG tablet Take 1 tablet (20 mg total) by mouth at bedtime.    [DISCONTINUED] predniSONE  (DELTASONE ) 20 MG tablet Take 2 tablets daily for 5 days   No facility-administered medications prior to visit.    Review of Systems  Constitutional: Negative.   HENT: Negative.    Eyes: Negative.   Respiratory: Negative.  Negative for shortness of breath.   Cardiovascular: Negative.  Negative for chest pain.  Gastrointestinal: Negative.  Negative for abdominal pain, constipation and diarrhea.  Genitourinary: Negative.   Musculoskeletal:  Negative for joint pain and myalgias.  Skin: Negative.   Neurological: Negative.  Negative for dizziness and headaches.  Endo/Heme/Allergies: Negative.   All other systems reviewed and are negative.      Objective:   BP 102/60   Pulse 93   Ht 5' 4 (1.626 m)   Wt 256 lb (116.1 kg)   SpO2 97%   BMI 43.94 kg/m   Vitals:   08/29/24 0956  BP: 102/60  Pulse: 93  Height: 5' 4 (1.626 m)  Weight: 256 lb (116.1 kg)  SpO2: 97%  BMI (Calculated): 43.92    Physical Exam Vitals and nursing note reviewed.  Constitutional:      Appearance: Normal appearance. She is normal weight.  HENT:     Head: Normocephalic and atraumatic.     Nose: Nose normal.     Mouth/Throat:     Mouth: Mucous membranes are moist.  Eyes:     Extraocular Movements: Extraocular movements intact.     Conjunctiva/sclera: Conjunctivae normal.     Pupils: Pupils are equal, round, and reactive to light.  Cardiovascular:     Rate and Rhythm: Normal rate and regular rhythm.     Pulses: Normal pulses.     Heart sounds: Normal heart sounds.  Pulmonary:     Effort: Pulmonary effort is normal.     Breath sounds: Normal breath sounds.  Abdominal:     General: Abdomen is flat. Bowel sounds are normal.     Palpations: Abdomen is soft.  Musculoskeletal:        General: Normal range of motion.     Cervical back: Normal range of motion.  Skin:    General: Skin is warm and dry.  Neurological:     General: No focal deficit present.     Mental  Status: She is alert and oriented to person, place, and time.  Psychiatric:        Mood and Affect: Mood normal.        Behavior: Behavior normal.        Thought Content: Thought content normal.  Judgment: Judgment normal.      No results found for any visits on 08/29/24.  Recent Results (from the past 2160 hours)  CMP14+EGFR     Status: Abnormal   Collection Time: 08/27/24  9:27 AM  Result Value Ref Range   Glucose 83 70 - 99 mg/dL   BUN 10 6 - 20 mg/dL   Creatinine, Ser 9.37 0.57 - 1.00 mg/dL   eGFR 883 >40 fO/fpw/8.26   BUN/Creatinine Ratio 16 9 - 23   Sodium 142 134 - 144 mmol/L   Potassium 4.7 3.5 - 5.2 mmol/L   Chloride 107 (H) 96 - 106 mmol/L   CO2 25 20 - 29 mmol/L   Calcium  8.7 8.7 - 10.2 mg/dL   Total Protein 5.8 (L) 6.0 - 8.5 g/dL   Albumin 3.5 (L) 3.9 - 4.9 g/dL   Globulin, Total 2.3 1.5 - 4.5 g/dL   Bilirubin Total <9.7 0.0 - 1.2 mg/dL   Alkaline Phosphatase 50 44 - 121 IU/L    Comment: **Effective September 01, 2024 Alkaline Phosphatase**   reference interval will be changing to:              Age                Female          Female           0 -  5 days         47 - 127       47 - 127           6 - 10 days         29 - 242       29 - 242          11 - 20 days        109 - 357      109 - 357          21 - 30 days         94 - 494       94 - 494           1 -  2 months      149 - 539      149 - 539           3 -  6 months      131 - 452      131 - 452           7 - 11 months      117 - 401      117 - 401   12 months -  6 years       158 - 369      158 - 369           7 - 12 years       150 - 409      150 - 409               13 years       156 - 435       78 - 227               14 years       114 - 375       64 - 161               15 years  88 - 279       56 - 134               16 years        74 - 207       51 - 121               17 years        63 - 161       47 - 113          18 - 20 years        51 - 125       42 - 106          21 - 50 years          47 - 123       41 - 116          51 - 80 years        49 - 135       51 - 125              >80 years        48 - 129       48 - 129    AST 15 0 - 40 IU/L   ALT 16 0 - 32 IU/L  Lipid panel     Status: Abnormal   Collection Time: 08/27/24  9:27 AM  Result Value Ref Range   Cholesterol, Total 170 100 - 199 mg/dL   Triglycerides 864 0 - 149 mg/dL   HDL 40 >60 mg/dL   VLDL Cholesterol Cal 24 5 - 40 mg/dL   LDL Chol Calc (NIH) 893 (H) 0 - 99 mg/dL   Chol/HDL Ratio 4.3 0.0 - 4.4 ratio    Comment:                                   T. Chol/HDL Ratio                                             Men  Women                               1/2 Avg.Risk  3.4    3.3                                   Avg.Risk  5.0    4.4                                2X Avg.Risk  9.6    7.1                                3X Avg.Risk 23.4   11.0   Hemoglobin A1c     Status: None   Collection Time: 08/27/24  9:27 AM  Result Value Ref Range   Hgb A1c MFr Bld 5.5 4.8 - 5.6 %    Comment:          Prediabetes: 5.7 -  6.4          Diabetes: >6.4          Glycemic control for adults with diabetes: <7.0    Est. average glucose Bld gHb Est-mCnc 111 mg/dL  TSH     Status: None   Collection Time: 08/27/24  9:27 AM  Result Value Ref Range   TSH 1.750 0.450 - 4.500 uIU/mL      Assessment & Plan:  Continue same medications. Check blood pressure when dizzy.   Problem List Items Addressed This Visit       Other   Hyperlipidemia - Primary   Relevant Medications   rosuvastatin  (CRESTOR ) 20 MG tablet    Return in about 4 weeks (around 09/26/2024).   Total time spent: 25 minutes  [employer], NP  08/29/2024   This document may have been prepared by Dragon Voice Recognition software and as such may include unintentional dictation errors.

## 2024-09-12 ENCOUNTER — Ambulatory Visit
Admission: RE | Admit: 2024-09-12 | Discharge: 2024-09-12 | Disposition: A | Discharge status: Home / Self Care | Source: Ambulatory Visit | Attending: Cardiology | Admitting: Cardiology

## 2024-09-12 DIAGNOSIS — Z1231 Encounter for screening mammogram for malignant neoplasm of breast: Secondary | ICD-10-CM | POA: Insufficient documentation

## 2024-09-16 ENCOUNTER — Ambulatory Visit: Payer: Self-pay | Admitting: Cardiology

## 2024-09-29 ENCOUNTER — Ambulatory Visit: Admitting: Cardiology

## 2024-09-29 ENCOUNTER — Encounter: Payer: Self-pay | Admitting: Cardiology

## 2024-09-29 VITALS — BP 116/68 | HR 88 | Ht 64.0 in | Wt 258.0 lb

## 2024-09-29 DIAGNOSIS — E782 Mixed hyperlipidemia: Secondary | ICD-10-CM | POA: Diagnosis not present

## 2024-09-29 DIAGNOSIS — M25552 Pain in left hip: Secondary | ICD-10-CM

## 2024-09-29 DIAGNOSIS — Z23 Encounter for immunization: Secondary | ICD-10-CM

## 2024-09-29 DIAGNOSIS — Z0130 Encounter for examination of blood pressure without abnormal findings: Secondary | ICD-10-CM

## 2024-09-29 DIAGNOSIS — Z131 Encounter for screening for diabetes mellitus: Secondary | ICD-10-CM

## 2024-09-29 NOTE — Progress Notes (Signed)
 Established Patient Office Visit  Subjective:  Patient ID: Cathy Trevino, female    DOB: 29-Aug-1986  Age: 38 y.o. MRN: [id number]  Chief Complaint  Patient presents with   Follow-up    4 months follow up    Patient in office for 4 month follow up, discuss recent lab results. Patient doing well. Recently completed physical therapy for hip pain. Patient reports pain improved while doing therapy. Since she completed therapy, she has been doing exercises at home. Hip pain has returned. Patient requesting to return to physical therapy. Referral placed.  Patient requesting flu vaccination today. Discussed recent lab work, stable.  Recent mammogram normal, repeat in one  year.  Continue same medications.     No other concerns at this time.   Past Medical History:  Diagnosis Date   Anemia    Anxiety    Asthma    Depression    Menorrhagia     Past Surgical History:  Procedure Laterality Date   CESAREAN SECTION  2012   TUBAL LIGATION      Social History   Socioeconomic History   Marital status: Divorced    Spouse name: Not on file   Number of children: 3   Years of education: Not on file   Highest education level: Some college, no degree  Occupational History   Not on file  Tobacco Use   Smoking status: Former    Current packs/day: 0.00    Average packs/day: 0.3 packs/day for 5.0 years (1.3 ttl pk-yrs)    Types: Cigarettes    Start date: 02/22/2011    Quit date: 02/22/2016    Years since quitting: 8.6   Smokeless tobacco: Never  Vaping Use   Vaping status: Never Used  Substance and Sexual Activity   Alcohol use: No   Drug use: No   Sexual activity: Yes    Birth control/protection: I.U.D.  Other Topics Concern   Not on file  Social History Narrative   Not on file   Social Drivers of Health   Financial Resource Strain: Not on file  Food Insecurity: Not on file  Transportation Needs: Not on file  Physical Activity: Sufficiently Active (11/04/2018)    Exercise Vital Sign    Days of Exercise per Week: 7 days    Minutes of Exercise per Session: 40 min  Stress: Not on file  Social Connections: Not on file  Intimate Partner Violence: Not on file    Family History  Problem Relation Age of Onset   Heart failure Mother    Hypertension Mother    COPD Mother    Alcohol abuse Father    Depression Father    COPD Father    Alcohol abuse Brother    Schizophrenia Brother    ADD / ADHD Daughter    Autism spectrum disorder Son    Autism spectrum disorder Son     Allergies  Allergen Reactions   Other     Seasonal allergies   Penicillins Rash    Outpatient Medications Prior to Visit  Medication Sig   acetaminophen  (TYLENOL ) 500 MG tablet Take 500 mg by mouth every 6 (six) hours as needed.   ascorbic acid (VITAMIN C) 500 MG tablet Take 500 mg by mouth every other day.   cetirizine  (ZYRTEC ) 10 MG tablet Take 1 tablet (10 mg total) by mouth at bedtime.   citalopram  (CELEXA ) 10 MG tablet Take 1 tablet (10 mg total) by mouth daily.   ergocalciferol  (VITAMIN D2) 1.25  MG (50000 UT) capsule Take 1 capsule (50,000 Units total) by mouth once a week.   fluticasone  (FLONASE ) 50 MCG/ACT nasal spray Place 1 spray into both nostrils daily.   hydrOXYzine  (VISTARIL ) 25 MG capsule Take 1 capsule (25 mg total) by mouth 2 (two) times daily as needed for anxiety.   meloxicam  (MOBIC ) 15 MG tablet Take 1 tablet (15 mg total) by mouth daily.   Multiple Vitamin (MULTIVITAMIN ADULT PO) Take 1 tablet by mouth daily at 8 pm.   nystatin  (MYCOSTATIN /NYSTOP ) powder Apply 1 Application topically 3 (three) times daily.   pantoprazole  (PROTONIX ) 20 MG tablet Take 1 tablet (20 mg total) by mouth daily.   rosuvastatin  (CRESTOR ) 20 MG tablet Take 1 tablet (20 mg total) by mouth at bedtime.   triamcinolone  cream (KENALOG ) 0.1 % Apply 1 Application topically 2 (two) times daily.   No facility-administered medications prior to visit.    Review of Systems  Constitutional:  Negative.   HENT: Negative.    Eyes: Negative.   Respiratory: Negative.  Negative for shortness of breath.   Cardiovascular: Negative.  Negative for chest pain.  Gastrointestinal: Negative.  Negative for abdominal pain, constipation and diarrhea.  Genitourinary: Negative.   Musculoskeletal:  Negative for joint pain and myalgias.  Skin: Negative.   Neurological: Negative.  Negative for dizziness and headaches.  Endo/Heme/Allergies: Negative.   All other systems reviewed and are negative.      Objective:   BP 116/68   Pulse 88   Ht 5' 4 (1.626 m)   Wt 258 lb (117 kg)   SpO2 96%   BMI 44.29 kg/m   Vitals:   09/29/24 0906 09/29/24 0920  BP: 116/68   Pulse: (!) 102 88  Height: 5' 4 (1.626 m)   Weight: 258 lb (117 kg)   SpO2: 96%   BMI (Calculated): 44.26     Physical Exam Vitals and nursing note reviewed.  Constitutional:      Appearance: Normal appearance. She is normal weight.  HENT:     Head: Normocephalic and atraumatic.     Nose: Nose normal.     Mouth/Throat:     Mouth: Mucous membranes are moist.  Eyes:     Extraocular Movements: Extraocular movements intact.     Conjunctiva/sclera: Conjunctivae normal.     Pupils: Pupils are equal, round, and reactive to light.  Cardiovascular:     Rate and Rhythm: Normal rate and regular rhythm.     Pulses: Normal pulses.     Heart sounds: Normal heart sounds.  Pulmonary:     Effort: Pulmonary effort is normal.     Breath sounds: Normal breath sounds.  Abdominal:     General: Abdomen is flat. Bowel sounds are normal.     Palpations: Abdomen is soft.  Musculoskeletal:        General: Normal range of motion.     Cervical back: Normal range of motion.  Skin:    General: Skin is warm and dry.  Neurological:     General: No focal deficit present.     Mental Status: She is alert and oriented to person, place, and time.  Psychiatric:        Mood and Affect: Mood normal.        Behavior: Behavior normal.         Thought Content: Thought content normal.        Judgment: Judgment normal.      No results found for any visits on 09/29/24.  Recent Results (from the past 2160 hours)  CMP14+EGFR     Status: Abnormal   Collection Time: 08/27/24  9:27 AM  Result Value Ref Range   Glucose 83 70 - 99 mg/dL   BUN 10 6 - 20 mg/dL   Creatinine, Ser 9.37 0.57 - 1.00 mg/dL   eGFR 883 >40 fO/fpw/8.26   BUN/Creatinine Ratio 16 9 - 23   Sodium 142 134 - 144 mmol/L   Potassium 4.7 3.5 - 5.2 mmol/L   Chloride 107 (H) 96 - 106 mmol/L   CO2 25 20 - 29 mmol/L   Calcium  8.7 8.7 - 10.2 mg/dL   Total Protein 5.8 (L) 6.0 - 8.5 g/dL   Albumin 3.5 (L) 3.9 - 4.9 g/dL   Globulin, Total 2.3 1.5 - 4.5 g/dL   Bilirubin Total <9.7 0.0 - 1.2 mg/dL   Alkaline Phosphatase 50 44 - 121 IU/L    Comment: **Effective September 01, 2024 Alkaline Phosphatase**   reference interval will be changing to:              Age                Female          Female           0 -  5 days         47 - 127       47 - 127           6 - 10 days         29 - 242       29 - 242          11 - 20 days        109 - 357      109 - 357          21 - 30 days         94 - 494       94 - 494           1 -  2 months      149 - 539      149 - 539           3 -  6 months      131 - 452      131 - 452           7 - 11 months      117 - 401      117 - 401   12 months -  6 years       158 - 369      158 - 369           7 - 12 years       150 - 409      150 - 409               13 years       156 - 435       78 - 227               14 years       114 - 375       64 - 161               15 years        88 - 279       56 - 134  16 years        74 - 207       51 - 121               17 years        63 - 161       47 - 113          18 - 20 years        51 - 125       42 - 106          21 - 50 years         47 - 123       41 - 116          51 - 80 years        49 - 135       51 - 125              >80 years        48 - 129       48 - 129    AST 15 0 -  40 IU/L   ALT 16 0 - 32 IU/L  Lipid panel     Status: Abnormal   Collection Time: 08/27/24  9:27 AM  Result Value Ref Range   Cholesterol, Total 170 100 - 199 mg/dL   Triglycerides 864 0 - 149 mg/dL   HDL 40 >60 mg/dL   VLDL Cholesterol Cal 24 5 - 40 mg/dL   LDL Chol Calc (NIH) 893 (H) 0 - 99 mg/dL   Chol/HDL Ratio 4.3 0.0 - 4.4 ratio    Comment:                                   T. Chol/HDL Ratio                                             Men  Women                               1/2 Avg.Risk  3.4    3.3                                   Avg.Risk  5.0    4.4                                2X Avg.Risk  9.6    7.1                                3X Avg.Risk 23.4   11.0   Hemoglobin A1c     Status: None   Collection Time: 08/27/24  9:27 AM  Result Value Ref Range   Hgb A1c MFr Bld 5.5 4.8 - 5.6 %    Comment:          Prediabetes: 5.7 - 6.4          Diabetes: >6.4          Glycemic control for adults with  diabetes: <7.0    Est. average glucose Bld gHb Est-mCnc 111 mg/dL  TSH     Status: None   Collection Time: 08/27/24  9:27 AM  Result Value Ref Range   TSH 1.750 0.450 - 4.500 uIU/mL      Assessment & Plan:  Referral sent to PT Flu vaccine today Continue same medications  Problem List Items Addressed This Visit       Other   Hyperlipidemia - Primary   Left hip pain   Relevant Orders   Ambulatory referral to Physical Therapy   Other Visit Diagnoses       Flu vaccine need       Relevant Orders   Influenza, MDCK, trivalent, PF(Flucelvax egg-free) (Completed)       Return in about 4 months (around 01/30/2025) for fasting lab work prior.   Total time spent: 25 minutes  [employer], NP  09/29/2024   This document may have been prepared by Dragon Voice Recognition software and as such may include unintentional dictation errors.

## 2025-01-07 ENCOUNTER — Ambulatory Visit
Admission: RE | Admit: 2025-01-07 | Discharge: 2025-01-07 | Disposition: A | Discharge status: Home / Self Care | Attending: Cardiology | Admitting: Cardiology

## 2025-01-07 ENCOUNTER — Ambulatory Visit: Admitting: Cardiology

## 2025-01-07 ENCOUNTER — Ambulatory Visit
Admission: RE | Admit: 2025-01-07 | Discharge: 2025-01-07 | Disposition: A | Source: Ambulatory Visit | Attending: Cardiology

## 2025-01-07 ENCOUNTER — Encounter: Payer: Self-pay | Admitting: Cardiology

## 2025-01-07 VITALS — BP 104/66 | HR 95 | Ht 64.0 in | Wt 259.8 lb

## 2025-01-07 DIAGNOSIS — M5126 Other intervertebral disc displacement, lumbar region: Secondary | ICD-10-CM

## 2025-01-07 DIAGNOSIS — M47817 Spondylosis without myelopathy or radiculopathy, lumbosacral region: Secondary | ICD-10-CM

## 2025-01-07 DIAGNOSIS — M5137 Other intervertebral disc degeneration, lumbosacral region with discogenic back pain only: Secondary | ICD-10-CM | POA: Insufficient documentation

## 2025-01-07 DIAGNOSIS — M47816 Spondylosis without myelopathy or radiculopathy, lumbar region: Secondary | ICD-10-CM | POA: Diagnosis not present

## 2025-01-07 DIAGNOSIS — E66813 Obesity, class 3: Secondary | ICD-10-CM

## 2025-01-07 DIAGNOSIS — Z6841 Body Mass Index (BMI) 40.0 and over, adult: Secondary | ICD-10-CM

## 2025-01-07 DIAGNOSIS — G8929 Other chronic pain: Secondary | ICD-10-CM | POA: Diagnosis not present

## 2025-01-07 DIAGNOSIS — M545 Low back pain, unspecified: Secondary | ICD-10-CM

## 2025-01-07 DIAGNOSIS — Z0130 Encounter for examination of blood pressure without abnormal findings: Secondary | ICD-10-CM

## 2025-01-07 MED ORDER — CYCLOBENZAPRINE HCL 10 MG PO TABS: 10.0000 mg | ORAL_TABLET | Freq: Three times a day (TID) | ORAL | 3 refills | Status: AC | PRN

## 2025-01-07 NOTE — Progress Notes (Signed)
 "  Established Patient Office Visit  Subjective:  Patient ID: Cathy Trevino, female    DOB: 09/10/1986  Age: 39 y.o. MRN: [id number]  Chief Complaint  Patient presents with   Acute Visit    Lower back pain radiating down her legs has started to get worse within the last few days, she was diagnosed with degenerative disc disease in 2019.    Patient in office for an acute visit, complaining of lower back pain radiating down her legs has started to get worse within the last few days, she was diagnosed with degenerative disc disease in 2019. CT lumbar back 2024 Degenerative changes. No acute osseous abnormalities. Over the past week, patient reports pain is worse, numbness/tingling down back of legs.  Will order an xray of the lumbar spine to check for acute changes. Patient has done PT in the past, states it didn't help. Will send in a muscle relaxer. Patient already taking meloxicam .   Back Pain This is a chronic problem. The current episode started more than 1 year ago. The problem occurs constantly. The problem has been gradually worsening since onset. The pain is present in the lumbar spine. The pain radiates to the right thigh and left thigh. The pain is severe. The symptoms are aggravated by position, standing, sitting and twisting. Stiffness is present All day. Associated symptoms include numbness and tingling. Pertinent negatives include no abdominal pain, chest pain or headaches. Risk factors include poor posture, obesity and lack of exercise. She has tried NSAIDs for the symptoms.    No other concerns at this time.   Past Medical History:  Diagnosis Date   Anemia    Anxiety    Asthma    Bereavement 09/06/2022   Depression    Menorrhagia     Past Surgical History:  Procedure Laterality Date   CESAREAN SECTION  2012   TUBAL LIGATION      Social History   Socioeconomic History   Marital status: Divorced    Spouse name: Not on file   Number of children: 3   Years of  education: Not on file   Highest education level: Some college, no degree  Occupational History   Not on file  Tobacco Use   Smoking status: Former    Current packs/day: 0.00    Average packs/day: 0.3 packs/day for 5.0 years (1.3 ttl pk-yrs)    Types: Cigarettes    Start date: 02/22/2011    Quit date: 02/22/2016    Years since quitting: 8.8   Smokeless tobacco: Never  Vaping Use   Vaping status: Never Used  Substance and Sexual Activity   Alcohol use: No   Drug use: No   Sexual activity: Yes    Birth control/protection: I.U.D.  Other Topics Concern   Not on file  Social History Narrative   Not on file   Social Drivers of Health   Tobacco Use: Medium Risk (01/07/2025)   Patient History    Smoking Tobacco Use: Former    Smokeless Tobacco Use: Never    Passive Exposure: Not on Actuary Strain: Not on file  Food Insecurity: Not on file  Transportation Needs: Not on file  Physical Activity: Not on file  Stress: Not on file  Social Connections: Not on file  Intimate Partner Violence: Not on file  Depression (PHQ2-9): Medium Risk (10/02/2022)   Depression (PHQ2-9)    PHQ-2 Score: 6  Alcohol Screen: Not on file  Housing: Not on file  Utilities:  Not on file  Health Literacy: Not on file    Family History  Problem Relation Age of Onset   Heart failure Mother    Hypertension Mother    COPD Mother    Alcohol abuse Father    Depression Father    COPD Father    Alcohol abuse Brother    Schizophrenia Brother    ADD / ADHD Daughter    Autism spectrum disorder Son    Autism spectrum disorder Son     Allergies[1]  Show/hide medication list[2]  Review of Systems  Constitutional: Negative.   HENT: Negative.    Eyes: Negative.   Respiratory: Negative.  Negative for shortness of breath.   Cardiovascular: Negative.  Negative for chest pain.  Gastrointestinal: Negative.  Negative for abdominal pain, constipation and diarrhea.  Genitourinary: Negative.    Musculoskeletal:  Positive for back pain. Negative for joint pain and myalgias.  Skin: Negative.   Neurological:  Positive for tingling and numbness. Negative for dizziness and headaches.  Endo/Heme/Allergies: Negative.   All other systems reviewed and are negative.      Objective:   BP 104/66   Pulse 95   Ht 5' 4 (1.626 m)   Wt 259 lb 12.8 oz (117.8 kg)   SpO2 99%   BMI 44.59 kg/m   Vitals:   01/07/25 0920  BP: 104/66  Pulse: 95  Height: 5' 4 (1.626 m)  Weight: 259 lb 12.8 oz (117.8 kg)  SpO2: 99%  BMI (Calculated): 44.57    Physical Exam Vitals and nursing note reviewed.  Constitutional:      Appearance: Normal appearance. She is normal weight.  HENT:     Head: Normocephalic and atraumatic.     Nose: Nose normal.     Mouth/Throat:     Mouth: Mucous membranes are moist.  Eyes:     Extraocular Movements: Extraocular movements intact.     Conjunctiva/sclera: Conjunctivae normal.     Pupils: Pupils are equal, round, and reactive to light.  Cardiovascular:     Rate and Rhythm: Normal rate and regular rhythm.     Pulses: Normal pulses.     Heart sounds: Normal heart sounds.  Pulmonary:     Effort: Pulmonary effort is normal.     Breath sounds: Normal breath sounds.  Abdominal:     General: Abdomen is flat. Bowel sounds are normal.     Palpations: Abdomen is soft.  Musculoskeletal:        General: Normal range of motion.     Cervical back: Normal range of motion.  Skin:    General: Skin is warm and dry.  Neurological:     General: No focal deficit present.     Mental Status: She is alert and oriented to person, place, and time.  Psychiatric:        Mood and Affect: Mood normal.        Behavior: Behavior normal.        Thought Content: Thought content normal.        Judgment: Judgment normal.      No results found for any visits on 01/07/25.  No results found for this or any previous visit (from the past 2160 hours).    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   Total time spent: 25 minutes. This time includes review of previous notes and results and patient face to face interaction during today's visit.    Jeoffrey Pollen, NP  01/07/2025   This document may have been prepared by [facility] Voice Recognition software and as such may include unintentional dictation errors.     [1]  Allergies Allergen Reactions   Other     Seasonal allergies   Penicillins Rash  [2]  Outpatient Medications Prior to Visit  Medication Sig   acetaminophen  (TYLENOL ) 500 MG tablet Take 500 mg by mouth every 6 (six) hours as needed.   ascorbic acid (VITAMIN C) 500 MG tablet Take 500 mg by mouth every other day.   cetirizine  (ZYRTEC ) 10 MG tablet Take 1 tablet (10 mg total) by mouth at bedtime.   citalopram  (CELEXA ) 10 MG tablet Take 1 tablet (10 mg total) by mouth daily.   ergocalciferol  (VITAMIN D2) 1.25 MG (50000 UT) capsule Take 1 capsule (50,000 Units total) by mouth once a week.   fluticasone  (FLONASE ) 50 MCG/ACT nasal spray Place 1 spray into both nostrils daily.   hydrOXYzine  (VISTARIL ) 25 MG capsule Take 1 capsule (25 mg total) by mouth 2 (two) times daily as needed  for anxiety.   meloxicam  (MOBIC ) 15 MG tablet Take 1 tablet (15 mg total) by mouth daily.   Multiple Vitamin (MULTIVITAMIN ADULT PO) Take 1 tablet by mouth daily at 8 pm.   nystatin  (MYCOSTATIN /NYSTOP ) powder Apply 1 Application topically 3 (three) times daily.   pantoprazole  (PROTONIX ) 20 MG tablet Take 1 tablet (20 mg total) by mouth daily.   rosuvastatin  (CRESTOR ) 20 MG tablet Take 1 tablet (20 mg total) by mouth at bedtime.   triamcinolone  cream (KENALOG ) 0.1 % Apply 1 Application topically 2 (two) times daily.   No facility-administered medications prior to visit.   "

## 2025-01-14 ENCOUNTER — Telehealth: Payer: Self-pay

## 2025-01-14 ENCOUNTER — Other Ambulatory Visit: Payer: Self-pay | Admitting: Cardiology

## 2025-01-14 ENCOUNTER — Ambulatory Visit: Payer: Self-pay | Admitting: Cardiology

## 2025-01-14 MED ORDER — EPINEPHRINE 0.3 MG/0.3ML IJ SOAJ: 0.3000 mg | INTRAMUSCULAR | 2 refills | Status: AC | PRN

## 2025-01-14 NOTE — Telephone Encounter (Signed)
 Needs new prescription for epipen . Wants a call back about xray results from last week.

## 2025-01-19 ENCOUNTER — Emergency Department
Admission: EM | Admit: 2025-01-19 | Discharge: 2025-01-19 | Disposition: A | Discharge status: Home / Self Care | Attending: Emergency Medicine | Admitting: Emergency Medicine

## 2025-01-19 ENCOUNTER — Other Ambulatory Visit: Payer: Self-pay

## 2025-01-19 ENCOUNTER — Emergency Department

## 2025-01-19 ENCOUNTER — Encounter: Payer: Self-pay | Admitting: Emergency Medicine

## 2025-01-19 DIAGNOSIS — G43809 Other migraine, not intractable, without status migrainosus: Secondary | ICD-10-CM | POA: Insufficient documentation

## 2025-01-19 LAB — CBC
HCT: 41.5 % (ref 36.0–46.0)
Hemoglobin: 13.5 g/dL (ref 12.0–15.0)
MCH: 27.8 pg (ref 26.0–34.0)
MCHC: 32.5 g/dL (ref 30.0–36.0)
MCV: 85.4 fL (ref 80.0–100.0)
Platelets: 222 10*3/uL (ref 150–400)
RBC: 4.86 MIL/uL (ref 3.87–5.11)
RDW: 14.4 % (ref 11.5–15.5)
WBC: 12.7 10*3/uL — ABNORMAL HIGH (ref 4.0–10.5)
nRBC: 0 % (ref 0.0–0.2)

## 2025-01-19 LAB — COMPREHENSIVE METABOLIC PANEL WITH GFR
ALT: 13 U/L (ref 0–44)
AST: 15 U/L (ref 15–41)
Albumin: 3.8 g/dL (ref 3.5–5.0)
Alkaline Phosphatase: 58 U/L (ref 38–126)
Anion gap: 9 (ref 5–15)
BUN: 9 mg/dL (ref 6–20)
CO2: 27 mmol/L (ref 22–32)
Calcium: 9 mg/dL (ref 8.9–10.3)
Chloride: 105 mmol/L (ref 98–111)
Creatinine, Ser: 0.57 mg/dL (ref 0.44–1.00)
GFR, Estimated: >60 mL/min
Glucose, Bld: 127 mg/dL — ABNORMAL HIGH (ref 70–99)
Potassium: 4.6 mmol/L (ref 3.5–5.1)
Sodium: 140 mmol/L (ref 135–145)
Total Bilirubin: 0.3 mg/dL (ref 0.0–1.2)
Total Protein: 6.5 g/dL (ref 6.5–8.1)

## 2025-01-19 LAB — URINALYSIS, ROUTINE W REFLEX MICROSCOPIC
Bilirubin Urine: NEGATIVE
Glucose, UA: NEGATIVE mg/dL
Hgb urine dipstick: NEGATIVE
Ketones, ur: 5 mg/dL — AB
Leukocytes,Ua: NEGATIVE
Nitrite: NEGATIVE
Protein, ur: NEGATIVE mg/dL
Specific Gravity, Urine: 1.023 (ref 1.005–1.030)
pH: 6 (ref 5.0–8.0)

## 2025-01-19 LAB — LIPASE, BLOOD: Lipase: 20 U/L (ref 11–51)

## 2025-01-19 LAB — PREGNANCY, URINE: Preg Test, Ur: NEGATIVE

## 2025-01-19 MED ORDER — METOCLOPRAMIDE HCL 5 MG/ML IJ SOLN
20.0000 mg | Freq: Once | INTRAVENOUS | Status: AC
  Administered 2025-01-19: 20 mg via INTRAVENOUS
  Filled 2025-01-19: qty 4

## 2025-01-19 MED ORDER — DIAZEPAM 5 MG/ML IJ SOLN
5.0000 mg | Freq: Once | INTRAMUSCULAR | Status: AC
  Administered 2025-01-19: 5 mg via INTRAVENOUS
  Filled 2025-01-19: qty 2

## 2025-01-19 MED ORDER — ACETAMINOPHEN 325 MG PO TABS
975.0000 mg | ORAL_TABLET | Freq: Once | ORAL | Status: AC
  Administered 2025-01-19: 975 mg via ORAL
  Filled 2025-01-19: qty 3

## 2025-01-19 MED ORDER — DIPHENHYDRAMINE HCL 50 MG/ML IJ SOLN
25.0000 mg | Freq: Once | INTRAMUSCULAR | Status: AC
  Administered 2025-01-19: 25 mg via INTRAVENOUS
  Filled 2025-01-19: qty 1

## 2025-01-19 MED ORDER — KETOROLAC TROMETHAMINE 30 MG/ML IJ SOLN
30.0000 mg | Freq: Once | INTRAMUSCULAR | Status: AC
  Administered 2025-01-19: 30 mg via INTRAVENOUS
  Filled 2025-01-19: qty 1

## 2025-01-19 MED ORDER — SODIUM CHLORIDE 0.9 % IV SOLN
Freq: Once | INTRAVENOUS | Status: AC
  Administered 2025-01-19: 999 mL via INTRAVENOUS

## 2025-01-19 NOTE — ED Triage Notes (Signed)
 Pt woke with migraine headache this morning. Then began to vomit.  No hx of vomiting with migraines in the past.  Pt states she feels chills but not a fever.  No diarrhea or constipation. No urinary symptoms.

## 2025-01-30 ENCOUNTER — Ambulatory Visit: Admitting: Cardiology
